# Patient Record
Sex: Female | Born: 1960 | Race: White | Hispanic: Refuse to answer | Marital: Single | Smoking: Former smoker
Health system: Southern US, Community
[De-identification: ages and names within clinical notes are randomized; demographics above are authoritative.]

## PROBLEM LIST (undated history)

## (undated) DIAGNOSIS — M1990 Unspecified osteoarthritis, unspecified site: Secondary | ICD-10-CM

## (undated) DIAGNOSIS — M542 Cervicalgia: Secondary | ICD-10-CM

## (undated) DIAGNOSIS — Z862 Personal history of diseases of the blood and blood-forming organs and certain disorders involving the immune mechanism: Secondary | ICD-10-CM

## (undated) DIAGNOSIS — F1910 Other psychoactive substance abuse, uncomplicated: Secondary | ICD-10-CM

## (undated) DIAGNOSIS — K838 Other specified diseases of biliary tract: Secondary | ICD-10-CM

## (undated) DIAGNOSIS — F419 Anxiety disorder, unspecified: Secondary | ICD-10-CM

## (undated) DIAGNOSIS — K219 Gastro-esophageal reflux disease without esophagitis: Secondary | ICD-10-CM

## (undated) DIAGNOSIS — E785 Hyperlipidemia, unspecified: Secondary | ICD-10-CM

## (undated) DIAGNOSIS — E559 Vitamin D deficiency, unspecified: Secondary | ICD-10-CM

## (undated) DIAGNOSIS — F32A Depression, unspecified: Secondary | ICD-10-CM

## (undated) DIAGNOSIS — D649 Anemia, unspecified: Secondary | ICD-10-CM

## (undated) DIAGNOSIS — Z6740 Type O blood, Rh positive: Secondary | ICD-10-CM

## (undated) DIAGNOSIS — M549 Dorsalgia, unspecified: Secondary | ICD-10-CM

## (undated) DIAGNOSIS — G4700 Insomnia, unspecified: Secondary | ICD-10-CM

## (undated) DIAGNOSIS — G8929 Other chronic pain: Secondary | ICD-10-CM

## (undated) DIAGNOSIS — F1010 Alcohol abuse, uncomplicated: Secondary | ICD-10-CM

## (undated) DIAGNOSIS — G43909 Migraine, unspecified, not intractable, without status migrainosus: Secondary | ICD-10-CM

## (undated) DIAGNOSIS — T8859XA Other complications of anesthesia, initial encounter: Secondary | ICD-10-CM

## (undated) HISTORY — DX: Depression, unspecified: F32.A

## (undated) HISTORY — DX: Anemia, unspecified: D64.9

## (undated) HISTORY — DX: Personal history of diseases of the blood and blood-forming organs and certain disorders involving the immune mechanism: Z86.2

## (undated) HISTORY — DX: Dorsalgia, unspecified: M54.9

## (undated) HISTORY — PX: REPLACEMENT TOTAL KNEE: SUR1224

## (undated) HISTORY — DX: Alcohol abuse, uncomplicated: F10.10

## (undated) HISTORY — DX: Other chronic pain: G89.29

## (undated) HISTORY — DX: Unspecified osteoarthritis, unspecified site: M19.90

## (undated) HISTORY — DX: Hyperlipidemia, unspecified: E78.5

## (undated) HISTORY — DX: Vitamin D deficiency, unspecified: E55.9

## (undated) HISTORY — DX: Gastro-esophageal reflux disease without esophagitis: K21.9

## (undated) HISTORY — PX: ENDOMETRIAL ABLATION W/ NOVASURE: SUR434

## (undated) HISTORY — DX: Insomnia, unspecified: G47.00

## (undated) HISTORY — DX: Cervicalgia: M54.2

## (undated) HISTORY — PX: JOINT REPLACEMENT: SHX530

## (undated) HISTORY — DX: Migraine, unspecified, not intractable, without status migrainosus: G43.909

## (undated) HISTORY — PX: COLONOSCOPY: SHX174

## (undated) HISTORY — PX: MOUTH SURGERY: SHX715

## (undated) HISTORY — DX: Anxiety disorder, unspecified: F41.9

## (undated) HISTORY — DX: Other specified diseases of biliary tract: K83.8

## (undated) HISTORY — DX: Type O blood, Rh positive: Z67.40

## (undated) HISTORY — DX: Other psychoactive substance abuse, uncomplicated: F19.10

---

## 1996-06-08 DIAGNOSIS — F424 Excoriation (skin-picking) disorder: Secondary | ICD-10-CM

## 1996-06-08 HISTORY — DX: Excoriation (skin-picking) disorder: F42.4

## 1996-06-08 HISTORY — PX: GASTRIC BYPASS: SHX52

## 1999-06-09 HISTORY — PX: CHOLECYSTECTOMY: SHX55

## 2001-06-08 HISTORY — PX: HEMORRHOID SURGERY: SHX153

## 2002-06-08 HISTORY — PX: HERNIA REPAIR: SHX51

## 2004-06-08 HISTORY — PX: ABDOMINAL HYSTERECTOMY: SHX81

## 2006-06-08 HISTORY — PX: KNEE ARTHROSCOPY: SUR90

## 2006-09-07 HISTORY — PX: ABDOMINAL HYSTERECTOMY: SHX81

## 2014-09-18 LAB — HM HIV SCREENING LAB: HM HIV Screening: NEGATIVE

## 2014-09-18 LAB — HM HEPATITIS C SCREENING LAB: HM Hepatitis Screen: NEGATIVE

## 2017-06-08 HISTORY — PX: REPLACEMENT TOTAL KNEE: SUR1224

## 2017-07-08 DIAGNOSIS — M810 Age-related osteoporosis without current pathological fracture: Secondary | ICD-10-CM

## 2017-07-08 HISTORY — DX: Age-related osteoporosis without current pathological fracture: M81.0

## 2017-08-04 DIAGNOSIS — F331 Major depressive disorder, recurrent, moderate: Secondary | ICD-10-CM | POA: Insufficient documentation

## 2019-08-11 LAB — BASIC METABOLIC PANEL
BUN: 9 (ref 4–21)
CO2: 25 — AB (ref 13–22)
Chloride: 109 — AB (ref 99–108)
Creatinine: 1 (ref 0.5–1.1)
Glucose: 97
Potassium: 3.6 (ref 3.4–5.3)
Sodium: 141 (ref 137–147)

## 2019-08-11 LAB — IRON,TIBC AND FERRITIN PANEL
%SAT: 5
Ferritin: 10
Iron: 27
TIBC: 505

## 2019-08-11 LAB — HEMOGLOBIN A1C: Hemoglobin A1C: 5

## 2019-08-11 LAB — LIPID PANEL
Cholesterol: 190 (ref 0–200)
HDL: 44 (ref 35–70)
LDL Cholesterol: 123
Triglycerides: 115 (ref 40–160)

## 2019-08-11 LAB — CBC: RBC: 4.22 (ref 3.87–5.11)

## 2019-08-11 LAB — TSH: TSH: 1.7 (ref 0.41–5.90)

## 2019-08-11 LAB — CBC AND DIFFERENTIAL
HCT: 35 — AB (ref 36–46)
Hemoglobin: 10.4 — AB (ref 12.0–16.0)
Platelets: 243 (ref 150–399)
WBC: 6.5

## 2019-08-11 LAB — COMPREHENSIVE METABOLIC PANEL
Albumin: 3.7 (ref 3.5–5.0)
Calcium: 9.9 (ref 8.7–10.7)
GFR calc non Af Amer: 66
Globulin: 3.5

## 2019-08-11 LAB — VITAMIN D 25 HYDROXY (VIT D DEFICIENCY, FRACTURES): Vit D, 25-Hydroxy: 38

## 2020-01-12 ENCOUNTER — Ambulatory Visit (INDEPENDENT_AMBULATORY_CARE_PROVIDER_SITE_OTHER): Payer: Medicare Other | Admitting: Family Medicine

## 2020-01-12 ENCOUNTER — Encounter: Payer: Self-pay | Admitting: Family Medicine

## 2020-01-12 ENCOUNTER — Other Ambulatory Visit: Payer: Self-pay

## 2020-01-12 VITALS — BP 127/76 | HR 66 | Temp 97.6°F | Ht 65.0 in | Wt 275.8 lb

## 2020-01-12 DIAGNOSIS — R519 Headache, unspecified: Secondary | ICD-10-CM

## 2020-01-12 DIAGNOSIS — M25551 Pain in right hip: Secondary | ICD-10-CM | POA: Diagnosis not present

## 2020-01-12 DIAGNOSIS — F419 Anxiety disorder, unspecified: Secondary | ICD-10-CM

## 2020-01-12 DIAGNOSIS — F331 Major depressive disorder, recurrent, moderate: Secondary | ICD-10-CM

## 2020-01-12 DIAGNOSIS — M545 Low back pain, unspecified: Secondary | ICD-10-CM

## 2020-01-12 DIAGNOSIS — M25552 Pain in left hip: Secondary | ICD-10-CM

## 2020-01-12 DIAGNOSIS — G8929 Other chronic pain: Secondary | ICD-10-CM

## 2020-01-12 NOTE — Progress Notes (Signed)
New Patient Office Visit  Assessment & Plan:  1. Frequent headaches - After further discussing, headaches are occurring the days she does not take Celebrex. She is going to resume Celebrex QD.   2. Bilateral hip pain - Resume Celebrex QD since the pain occurs the days she does not take it.   3. Morbid obesity (HCC) - Education provided on obesity. Encouraged dietary changes.   4. Chronic bilateral low back pain without sciatica - Patient to return next week to discuss oxycodone after records have been reviewed and UDS obtained.  - Compliance Drug Analysis, Ur  5. Anxiety - Well controlled on current regimen.   6. Moderate episode of recurrent major depressive disorder (HCC) - Well controlled on current regimen.    Follow-up: Return in about 6 days (around 01/18/2020) for pain management.   Deliah Boston, MSN, APRN, FNP-C [facility]  Subjective:  Patient ID: Graceyn Fodor. Minkler, female    DOB: [birth year]  Age: 59 y.o. MRN: [id number]  Patient Care Team: Gwenlyn Fudge, FNP as PCP - General (Family Medicine)  CC:  Chief Complaint  Patient presents with  . New Patient (Initial Visit)    [place]  . Establish Care  . Headache    Patient states she has been having daily headaches x 3 weeks.  . Leg Pain    Patient states she has been having bilateral leg pain x 1 month.  . Edema    All over x 2 months    HPI Zelphia Cairo. Schiffman presents to establish care.   Patient moved to [facility] in April from [place].  Patient c/o headaches that always hit in the afternoon. She describes them as sharp pain in temple and across the eyes. She does not take medication when they come as she tries to avoid taking medication. When they become severe, she will take Naproxen which is helpful. She does take Benadryl 50 mg BID for allergies and has been taking it during the past 3 weeks when the headaches have been going on.   When she lays on her sides her hips ache on  the side she is laying on. When she gets up in the morning the pain goes down her left leg. She feels she has been walking different for the past two months. She does not feel the walking different is due to pain as she reports it happened in the past and she required PT, which was helpful. She is taking Celebrex every other day and reports it provides relief on the days she takes it.   She is also concerned that she is getting larger again. She has had gastric bypass. She does not feel she overeats, but she does graze throughout the day. She does not drink any soda. Drinks 6-8 bottles of water per day. Does not eat bread - uses tortillas. She feels she has a lot of fluid on her. She does add salt to her foods and eats a lot of high sodium foods.   Patient takes oxycodone for chronic pain.    Review of Systems  Constitutional: Negative for chills, fever, malaise/fatigue and weight loss.  HENT: Negative for congestion, ear discharge, ear pain, nosebleeds, sinus pain, sore throat and tinnitus.   Eyes: Negative for blurred vision, double vision, pain, discharge and redness.  Respiratory: Negative for cough, shortness of breath and wheezing.   Cardiovascular: Negative for chest pain, palpitations and leg swelling.  Gastrointestinal: Negative for abdominal pain, constipation, diarrhea,  heartburn, nausea and vomiting.  Genitourinary: Negative for dysuria, frequency and urgency.  Musculoskeletal: Positive for back pain and joint pain. Negative for myalgias.  Skin: Negative for rash.  Neurological: Positive for headaches. Negative for dizziness, seizures and weakness.  Psychiatric/Behavioral: Negative for depression, substance abuse and suicidal ideas. The patient is not nervous/anxious.     Current Outpatient Medications:  .  B Complex-C (SUPER B COMPLEX PO), Take 1 tablet by mouth daily., Disp: , Rfl:  .  busPIRone (BUSPAR) 15 MG tablet, Take 1 tablet by mouth in the morning, at noon, and at  bedtime., Disp: , Rfl:  .  celecoxib (CELEBREX) 200 MG capsule, Take 1 capsule by mouth every other day., Disp: , Rfl:  .  Cholecalciferol (VITAMIN D3) 125 MCG (5000 UT) CAPS, Take 1 capsule by mouth daily., Disp: , Rfl:  .  diphenhydrAMINE (ALLERGY) 25 mg capsule, Take 50 mg by mouth daily as needed., Disp: , Rfl:  .  famotidine (PEPCID) 20 MG tablet, Take 20 mg by mouth daily., Disp: , Rfl:  .  Oxycodone HCl 10 MG TABS, Take 10 mg by mouth every 8 (eight) hours as needed., Disp: , Rfl:  .  sertraline (ZOLOFT) 100 MG tablet, Take 1 tablet by mouth in the morning and at bedtime., Disp: , Rfl:  .  traZODone (DESYREL) 150 MG tablet, Take 2 tablets by mouth at bedtime., Disp: , Rfl:  .  ziprasidone (GEODON) 20 MG capsule, Take 1 capsule by mouth in the morning and at bedtime., Disp: , Rfl:   Allergies  Allergen Reactions  . Demerol [Meperidine] Other (See Comments)    disorientation   . Other Other (See Comments)    Disoriented and nausea  . Morphine Palpitations    SLOW HEART RATE     Past Medical History:  Diagnosis Date  . Alcohol abuse   . Anxiety   . Depression   . Drug abuse (HCC)   . Migraines     Past Surgical History:  Procedure Laterality Date  . ABDOMINAL HYSTERECTOMY  09/07/2006  . CESAREAN SECTION  02/16/1990  . CHOLECYSTECTOMY  2001  . GASTRIC BYPASS  1998  . HEMORRHOID SURGERY  2003  . HERNIA REPAIR  2004  . MOUTH SURGERY    . REPLACEMENT TOTAL KNEE Right 2019    Family History  Problem Relation Age of Onset  . Anxiety disorder Mother   . Depression Mother   . Breast cancer Mother   . Ovarian cancer Mother   . Alcohol abuse Father   . Heart disease Father   . Kidney disease Brother   . Breast cancer Maternal Aunt   . Breast cancer Maternal Grandmother   . Breast cancer Maternal Grandfather     Social History   Socioeconomic History  . Marital status: Single    Spouse name: Not on file  . Number of children: Not on file  . Years of education:  Not on file  . Highest education level: Not on file  Occupational History  . Not on file  Tobacco Use  . Smoking status: Former Smoker    Types: Cigarettes    Quit date: 1991    Years since quitting: 30.6  . Smokeless tobacco: Never Used  Vaping Use  . Vaping Use: Never used  Substance and Sexual Activity  . Alcohol use: Not Currently  . Drug use: Not Currently  . Sexual activity: Not Currently  Other Topics Concern  . Not on file  Social History  Narrative  . Not on file   Social Determinants of Health   Financial Resource Strain:   . Difficulty of Paying Living Expenses:   Food Insecurity:   . Worried About Programme researcher, broadcasting/film/video in the Last Year:   . Barista in the Last Year:   Transportation Needs:   . Freight forwarder (Medical):   Marland Kitchen Lack of Transportation (Non-Medical):   Physical Activity:   . Days of Exercise per Week:   . Minutes of Exercise per Session:   Stress:   . Feeling of Stress :   Social Connections:   . Frequency of Communication with Friends and Family:   . Frequency of Social Gatherings with Friends and Family:   . Attends Religious Services:   . Active Member of Clubs or Organizations:   . Attends Banker Meetings:   Marland Kitchen Marital Status:   Intimate Partner Violence:   . Fear of Current or Ex-Partner:   . Emotionally Abused:   Marland Kitchen Physically Abused:   . Sexually Abused:     Objective:   Today's Vitals: BP 127/76   Pulse 66   Temp 97.6 F (36.4 C) (Temporal)   Ht 5\' 5"  (1.651 m)   Wt 275 lb 12.8 oz (125.1 kg)   SpO2 96%   BMI 45.90 kg/m   Physical Exam Vitals reviewed.  Constitutional:      General: She is not in acute distress.    Appearance: Normal appearance. She is morbidly obese. She is not ill-appearing, toxic-appearing or diaphoretic.  HENT:     Head: Normocephalic and atraumatic.  Eyes:     General: No scleral icterus.       Right eye: No discharge.        Left eye: No discharge.      Conjunctiva/sclera: Conjunctivae normal.  Cardiovascular:     Rate and Rhythm: Normal rate and regular rhythm.     Heart sounds: Normal heart sounds. No murmur heard.  No friction rub. No gallop.   Pulmonary:     Effort: Pulmonary effort is normal. No respiratory distress.     Breath sounds: Normal breath sounds. No stridor. No wheezing, rhonchi or rales.  Musculoskeletal:        General: Normal range of motion.     Cervical back: Normal range of motion.  Skin:    General: Skin is warm and dry.     Capillary Refill: Capillary refill takes less than 2 seconds.     Comments: Scabs all over from patient picking at her skin.  Neurological:     General: No focal deficit present.     Mental Status: She is alert and oriented to person, place, and time. Mental status is at baseline.  Psychiatric:        Mood and Affect: Mood normal.        Behavior: Behavior normal.        Thought Content: Thought content normal.        Judgment: Judgment normal.

## 2020-01-12 NOTE — Patient Instructions (Addendum)
Take Celebrex daily for pain control.   Obesity, Adult Obesity is having too much body fat. Being obese means that your weight is more than what is healthy for you. BMI is a number that explains how much body fat you have. If you have a BMI of 30 or more, you are obese. Obesity is often caused by eating or drinking more calories than your body uses. Changing your lifestyle can help you lose weight. Obesity can cause serious health problems, such as:  Stroke.  Coronary artery disease (CAD).  Type 2 diabetes.  Some types of cancer, including cancers of the colon, breast, uterus, and gallbladder.  Osteoarthritis.  High blood pressure (hypertension).  High cholesterol.  Sleep apnea.  Gallbladder stones.  Infertility problems. What are the causes?  Eating meals each day that are high in calories, sugar, and fat.  Being born with genes that may make you more likely to become obese.  Having a medical condition that causes obesity.  Taking certain medicines.  Sitting a lot (having a sedentary lifestyle).  Not getting enough sleep.  Drinking a lot of drinks that have sugar in them. What increases the risk?  Having a family history of obesity.  Being an [place] American woman.  Being a Hispanic man.  Living in an area with limited access to: ? [facility], recreation centers, or sidewalks. ? Healthy food choices, such as grocery stores and farmers' markets. What are the signs or symptoms? The main sign is having too much body fat. How is this treated?  Treatment for this condition often includes changing your lifestyle. Treatment may include: ? Changing your diet. This may include making a healthy meal plan. ? Exercise. This may include activity that causes your heart to beat faster (aerobic exercise) and strength training. Work with your doctor to design a program that works for you. ? Medicine to help you lose weight. This may be used if you are not able to lose 1 pound a  week after 6 weeks of healthy eating and more exercise. ? Treating conditions that cause the obesity. ? Surgery. Options may include gastric banding and gastric bypass. This may be done if:  Other treatments have not helped to improve your condition.  You have a BMI of 40 or higher.  You have life-threatening health problems related to obesity. Follow these instructions at home: Eating and drinking   Follow advice from your doctor about what to eat and drink. Your doctor may tell you to: ? Limit fast food, sweets, and processed snack foods. ? Choose low-fat options. For example, choose low-fat milk instead of whole milk. ? Eat 5 or more servings of fruits or vegetables each day. ? Eat at home more often. This gives you more control over what you eat. ? Choose healthy foods when you eat out. ? Learn to read food labels. This will help you learn how much food is in 1 serving. ? Keep low-fat snacks available. ? Avoid drinks that have a lot of sugar in them. These include soda, fruit juice, iced tea with sugar, and flavored milk.  Drink enough water to keep your pee (urine) pale yellow.  Do not go on fad diets. Physical activity  Exercise often, as told by your doctor. Most adults should get up to 150 minutes of moderate-intensity exercise every week.Ask your doctor: ? What types of exercise are safe for you. ? How often you should exercise.  Warm up and stretch before being active.  Do slow stretching  after being active (cool down).  Rest between times of being active. Lifestyle  Work with your doctor and a food expert (dietitian) to set a weight-loss goal that is best for you.  Limit your screen time.  Find ways to reward yourself that do not involve food.  Do not drink alcohol if: ? Your doctor tells you not to drink. ? You are pregnant, may be pregnant, or are planning to become pregnant.  If you drink alcohol: ? Limit how much you use to:  0-1 drink a day for  women.  0-2 drinks a day for men. ? Be aware of how much alcohol is in your drink. In the [place] one drink equals one 12 oz bottle of beer (355 mL), one 5 oz glass of wine (148 mL), or one 1 oz glass of hard liquor (44 mL). General instructions  Keep a weight-loss journal. This can help you keep track of: ? The food that you eat. ? How much exercise you get.  Take over-the-counter and prescription medicines only as told by your doctor.  Take vitamins and supplements only as told by your doctor.  Think about joining a support group.  Keep all follow-up visits as told by your doctor. This is important. Contact a doctor if:  You cannot meet your weight loss goal after you have changed your diet and lifestyle for 6 weeks. Get help right away if you:  Are having trouble breathing.  Are having thoughts of harming yourself. Summary  Obesity is having too much body fat.  Being obese means that your weight is more than what is healthy for you.  Work with your doctor to set a weight-loss goal.  Get regular exercise as told by your doctor. This information is not intended to replace advice given to you by your health care provider. Make sure you discuss any questions you have with your health care provider. Document Revised: 01/27/2018 Document Reviewed: 01/27/2018 Elsevier Patient Education  2020 [employer].

## 2020-01-15 ENCOUNTER — Encounter: Payer: Self-pay | Admitting: Family Medicine

## 2020-01-15 DIAGNOSIS — F419 Anxiety disorder, unspecified: Secondary | ICD-10-CM | POA: Insufficient documentation

## 2020-01-15 DIAGNOSIS — F411 Generalized anxiety disorder: Secondary | ICD-10-CM | POA: Insufficient documentation

## 2020-01-15 DIAGNOSIS — G8929 Other chronic pain: Secondary | ICD-10-CM | POA: Insufficient documentation

## 2020-01-16 ENCOUNTER — Encounter: Payer: Self-pay | Admitting: Family Medicine

## 2020-01-16 LAB — COMPLIANCE DRUG ANALYSIS, UR

## 2020-01-18 ENCOUNTER — Encounter: Payer: Self-pay | Admitting: Family Medicine

## 2020-01-18 ENCOUNTER — Ambulatory Visit (INDEPENDENT_AMBULATORY_CARE_PROVIDER_SITE_OTHER): Payer: Medicare Other | Admitting: Family Medicine

## 2020-01-18 ENCOUNTER — Other Ambulatory Visit: Payer: Self-pay

## 2020-01-18 VITALS — BP 118/62 | HR 66 | Temp 97.6°F | Ht 65.0 in | Wt 274.4 lb

## 2020-01-18 DIAGNOSIS — E559 Vitamin D deficiency, unspecified: Secondary | ICD-10-CM

## 2020-01-18 DIAGNOSIS — M545 Low back pain, unspecified: Secondary | ICD-10-CM

## 2020-01-18 DIAGNOSIS — Z79899 Other long term (current) drug therapy: Secondary | ICD-10-CM

## 2020-01-18 DIAGNOSIS — D509 Iron deficiency anemia, unspecified: Secondary | ICD-10-CM | POA: Diagnosis not present

## 2020-01-18 DIAGNOSIS — G8929 Other chronic pain: Secondary | ICD-10-CM

## 2020-01-18 MED ORDER — OXYCODONE HCL 10 MG PO TABS: 10.0000 mg | ORAL_TABLET | Freq: Three times a day (TID) | ORAL | 0 refills | Status: DC | PRN

## 2020-01-18 NOTE — Progress Notes (Signed)
Assessment & Plan:  1-2. Chronic bilateral low back pain without sciatica/Controlled substance agreement signed - Well controlled on current regimen. Controlled substance agreement signed. UDS obtained last week and was as expected. PDMP reviewed with no concerning findings.  - Oxycodone HCl 10 MG TABS; Take 1 tablet (10 mg total) by mouth every 8 (eight) hours as needed.  Dispense: 90 tablet; Refill: 0 - Oxycodone HCl 10 MG TABS; Take 1 tablet (10 mg total) by mouth every 8 (eight) hours as needed.  Dispense: 90 tablet; Refill: 0 - Oxycodone HCl 10 MG TABS; Take 1 tablet (10 mg total) by mouth every 8 (eight) hours as needed.  Dispense: 90 tablet; Refill: 0 - CMP14+EGFR; Future  3. Iron deficiency anemia, unspecified iron deficiency anemia type - Patient is taking iron supplement once daily.  - CBC with Differential/Platelet; Future  4. Vitamin D deficiency - VITAMIN D 25 Hydroxy (Vit-D Deficiency, Fractures); Future  5. Morbid obesity ([place]) - CBC with Differential/Platelet; Future - CMP14+EGFR; Future - Lipid panel; Future   Return in about 3 months (around 04/19/2020) for follow-up of chronic medication conditions/pain.  Hendricks Limes, MSN, APRN, FNP-C [facility]  Subjective:    Patient ID: Hannah [name], female    DOB: [birth year], 59 y.o.   MRN: [id number]  Patient Care Team: Hannah Brooklyn, FNP as PCP - General (Family Medicine)   Chief Complaint:  Chief Complaint  Patient presents with   Back Pain    1 wk rck pain mngt    HPI: Hannah Caloca. [name] is a 59 y.o. female presenting on 01/18/2020 for Back Pain (1 wk rck pain mngt)  [place] Controlled Substance Abuse database reviewed- Yes   If yes- were their any concerning findings : No  Depression screen PHQ 2/9 01/12/2020  Decreased Interest 0  Down, Depressed, Hopeless 1  PHQ - 2 Score 1  Altered sleeping 2  Tired, decreased energy 2  Change in appetite 0  Feeling bad or failure  about yourself  1  Trouble concentrating 0  Moving slowly or fidgety/restless 0  Suicidal thoughts 0  PHQ-9 Score 6   GAD 7 : Generalized Anxiety Score 01/12/2020  Nervous, Anxious, on Edge 1  Control/stop worrying 1  Worry too much - different things 1  Trouble relaxing 0  Restless 0  Easily annoyed or irritable 1  Afraid - awful might happen 0  Total GAD 7 Score 4    [facility] drug screen performed- Yes  SOAPP  0= never  1= seldom  2=sometimes  3= often  4= very often  How often do you have mood swings? 0 How often do you smoke a cigarette within an hour after waking up? 0 How often have you taken medication other than the way that it was prescribed? 0 How often have you used illegal drugs in the past 5 years? 1 How often, in your lifetime, have you had legal problems or been arrested? 1  Score 2  Alcohol Audit - How often during the last year have found that you: 0-Never   1- Less than monthly   2- Monthly     3-Weekly     4-daily or almost daily  - found that you were not able to stop drinking once you started- 1 -failed to do what was normally expected of you because of drinking- 0 -needed a first drink in the morning- 0 -had a feeling of guilt or remorse after drinking- 0 -are/were unable to  remember what happened the night before because of your drinking- 0  0- NO   2- yes but not in last year  4- yes during last year -Have you or someone else been injured because of your drinking- 0 - Has anyone been concerned about your drinking or suggested you cut down- 2        TOTAL- 3  ( 0-7- alcohol education, 8-15- simple advice, 16-19 simple advice plus counseling, 20-40 referral for evaluation and treatment )  Opioid Risk  01/18/2020  Alcohol 3  Illegal Drugs 0  Rx Drugs 0  Alcohol 3  Illegal Drugs 0  Rx Drugs 0  Age between 16-45 years  0  History of Preadolescent Sexual Abuse 3  Psychological Disease 0  Depression 1  Opioid Risk Tool Scoring 10  Opioid Risk  Interpretation High Risk    Designated Pharmacy- [facility] in [place]  Pain assessment: Cause of pain- chronic back pain after work accident Pain location- back Pain on scale of 1-10- 6-7/10 without medication; 2-3/10 with medication Frequency- daily What increases pain- physical activity What makes pain better- rest, medication Effects on ADL- makes more difficult  Prior treatments tried and failed- PT, Norco, Tylenol, Ibuprofen Current opioids rx- Oxycodone 10 mg Q8H PRN # prescribed- 90 Morphine mg equivalent- 45 MME/day  Pain management agreement reviewed and signed- Yes   New complaints: None  Social history:  Relevant past medical, surgical, family and social history reviewed and updated as indicated. Interim medical history since our last visit reviewed.  Allergies and medications reviewed and updated.  DATA REVIEWED: CHART IN EPIC  ROS: Negative unless specifically indicated above in HPI.    Current Outpatient Medications:    B Complex-C (SUPER B COMPLEX PO), Take 1 tablet by mouth daily., Disp: , Rfl:    busPIRone (BUSPAR) 15 MG tablet, Take 1 tablet by mouth in the morning, at noon, and at bedtime., Disp: , Rfl:    celecoxib (CELEBREX) 200 MG capsule, Take 1 capsule by mouth every other day., Disp: , Rfl:    Cholecalciferol (VITAMIN D3) 125 MCG (5000 UT) CAPS, Take 1 capsule by mouth daily., Disp: , Rfl:    diphenhydrAMINE (ALLERGY) 25 mg capsule, Take 50 mg by mouth daily as needed., Disp: , Rfl:    famotidine (PEPCID) 20 MG tablet, Take 20 mg by mouth daily., Disp: , Rfl:    Oxycodone HCl 10 MG TABS, Take 10 mg by mouth every 8 (eight) hours as needed., Disp: , Rfl:    sertraline (ZOLOFT) 100 MG tablet, Take 1 tablet by mouth in the morning and at bedtime., Disp: , Rfl:    traZODone (DESYREL) 100 MG tablet, Take 200 mg by mouth at bedtime., Disp: , Rfl:    ziprasidone (GEODON) 20 MG capsule, Take 1 capsule by mouth in the morning and at bedtime.,  Disp: , Rfl:    Allergies  Allergen Reactions   Demerol [Meperidine] Other (See Comments)    disorientation    Other Other (See Comments)    Disoriented and nausea   Morphine Palpitations    SLOW HEART RATE    Past Medical History:  Diagnosis Date   Acquired dilation of bile duct    Alcohol abuse    Anxiety    Blood type O+    Chronic neck and back pain    Compulsive skin picking 1998   Depression    Drug abuse ([place])    GERD (gastroesophageal reflux disease)    History of  iron deficiency anemia    Hyperlipidemia    Insomnia    Migraines    Osteoporosis 07/08/2017   T-score -2.6 in 2019; -2.2 on 08/05/2018   Vitamin D deficiency     Past Surgical History:  Procedure Laterality Date   ABDOMINAL HYSTERECTOMY  09/07/2006   CESAREAN SECTION  02/16/1990   CHOLECYSTECTOMY  2001   ENDOMETRIAL ABLATION W/ [place]     GASTRIC BYPASS  1998   HEMORRHOID SURGERY  2003   HERNIA REPAIR  2004   KNEE ARTHROSCOPY Right 2008   MOUTH SURGERY     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    Social History   Socioeconomic History   Marital status: Single    Spouse name: Not on file   Number of children: Not on file   Years of education: Not on file   Highest education level: Not on file  Occupational History   Not on file  Tobacco Use   Smoking status: Former Smoker    Types: Cigarettes    Quit date: 1991    Years since quitting: 30.6   Smokeless tobacco: Never Used  Scientific laboratory technician Use: Never used  Substance and Sexual Activity   Alcohol use: Not Currently   Drug use: Not Currently   Sexual activity: Not Currently  Other Topics Concern   Not on file  Social History Narrative   Not on file   Social Determinants of Health   Financial Resource Strain:    Difficulty of Paying Living Expenses:   Food Insecurity:    Worried About Charity fundraiser in the Last Year:    Arboriculturist in the Last Year:   Transportation  Needs:    Film/video editor (Medical):    Lack of Transportation (Non-Medical):   Physical Activity:    Days of Exercise per Week:    Minutes of Exercise per Session:   Stress:    Feeling of Stress :   Social Connections:    Frequency of Communication with Friends and Family:    Frequency of Social Gatherings with Friends and Family:    Attends Religious Services:    Active Member of Clubs or Organizations:    Attends Music therapist:    Marital Status:   Intimate Partner Violence:    Fear of Current or Ex-Partner:    Emotionally Abused:    Physically Abused:    Sexually Abused:         Objective:    BP 118/62    Pulse 66    Temp 97.6 F (36.4 C) (Temporal)    Ht 5' 5"  (1.651 m)    Wt 274 lb 6.4 oz (124.5 kg)    BMI 45.66 kg/m   Wt Readings from Last 3 Encounters:  01/18/20 274 lb 6.4 oz (124.5 kg)  01/12/20 275 lb 12.8 oz (125.1 kg)    Physical Exam Vitals reviewed.  Constitutional:      General: She is not in acute distress.    Appearance: Normal appearance. She is morbidly obese. She is not ill-appearing, toxic-appearing or diaphoretic.  HENT:     Head: Normocephalic and atraumatic.  Eyes:     General: No scleral icterus.       Right eye: No discharge.        Left eye: No discharge.     Conjunctiva/sclera: Conjunctivae normal.  Cardiovascular:     Rate and Rhythm: Normal rate.  Pulmonary:  Effort: Pulmonary effort is normal. No respiratory distress.  Musculoskeletal:        General: Normal range of motion.     Cervical back: Normal range of motion.  Skin:    General: Skin is warm and dry.     Capillary Refill: Capillary refill takes less than 2 seconds.  Neurological:     General: No focal deficit present.     Mental Status: She is alert and oriented to person, place, and time. Mental status is at baseline.  Psychiatric:        Mood and Affect: Mood normal.        Behavior: Behavior normal.        Thought Content:  Thought content normal.        Judgment: Judgment normal.     Lab Results  Component Value Date   TSH 1.70 08/11/2019   Lab Results  Component Value Date   WBC 6.5 08/11/2019   HGB 10.4 (A) 08/11/2019   HCT 35 (A) 08/11/2019   PLT 243 08/11/2019   Lab Results  Component Value Date   NA 141 08/11/2019   K 3.6 08/11/2019   CO2 25 (A) 08/11/2019   BUN 9 08/11/2019   CREATININE 1.0 08/11/2019   ALBUMIN 3.7 08/11/2019   CALCIUM 9.9 08/11/2019   Lab Results  Component Value Date   CHOL 190 08/11/2019   Lab Results  Component Value Date   HDL 44 08/11/2019   Lab Results  Component Value Date   LDLCALC 123 08/11/2019   Lab Results  Component Value Date   TRIG 115 08/11/2019   No results found for: [facility] Lab Results  Component Value Date   HGBA1C 5.0 08/11/2019

## 2020-01-22 ENCOUNTER — Telehealth: Payer: Self-pay | Admitting: Family Medicine

## 2020-01-22 DIAGNOSIS — M545 Low back pain, unspecified: Secondary | ICD-10-CM

## 2020-01-22 DIAGNOSIS — G8929 Other chronic pain: Secondary | ICD-10-CM

## 2020-01-22 DIAGNOSIS — Z79899 Other long term (current) drug therapy: Secondary | ICD-10-CM

## 2020-01-22 NOTE — Telephone Encounter (Signed)
Pt had apt with [facility] 01/18/2020  [facility] [place] does not have Oxycodone HCl 10 MG TABS pt asked if it can be changed to 5MG  180 tablets.  Pt also needs a refill for bacofen 10MG  90 day supply 3TID  Please call back

## 2020-01-23 NOTE — Telephone Encounter (Signed)
Is this for one month or going forward? We could switch to 12 hour ER tablets if they have those. I prefer that over changing to 5 mg tablets.

## 2020-01-24 NOTE — Telephone Encounter (Signed)
I spoke to the pharmacy and they said they are having trouble with getting oxycodone and order it everyday but don't know when they will have the 10mg  in. Pharmacist also said she doesn't have enough 5mg  tablets (#180) and [facility] in is also having trouble with keeping [facility] in stock. They don't have the ER tablets either.

## 2020-01-24 NOTE — Telephone Encounter (Signed)
Can we please advise patient of this and ask her to call around to pharmacies she is willing to use and find one that will be able to accommodate her medication?  Once she has done this I am happy to resend her prescription.

## 2020-01-25 NOTE — Telephone Encounter (Signed)
Pt states the pharmacy stated her pain meds is on backorder. She is wanting to know if the rx can be changed in order to be able to get her medicine.

## 2020-02-13 ENCOUNTER — Other Ambulatory Visit: Payer: Self-pay | Admitting: Family Medicine

## 2020-02-13 NOTE — Telephone Encounter (Signed)
  Prescription Request  02/13/2020  What is the name of the medication or equipment? ziprasidone (GEODON) 20 MG capsule    Have you contacted your pharmacy to request a refill? (if applicable) yes  Which pharmacy would you like this sent to? [facility] in [place]   Patient notified that their request is being sent to the clinical staff for review and that they should receive a response within 2 business days.

## 2020-02-14 MED ORDER — ZIPRASIDONE HCL 20 MG PO CAPS: 20.0000 mg | ORAL_CAPSULE | Freq: Two times a day (BID) | ORAL | 1 refills | Status: DC

## 2020-02-26 ENCOUNTER — Telehealth: Payer: Self-pay | Admitting: Family Medicine

## 2020-02-26 MED ORDER — CELECOXIB 200 MG PO CAPS: 200.0000 mg | ORAL_CAPSULE | Freq: Every day | ORAL | 1 refills | Status: DC

## 2020-02-26 MED ORDER — BACLOFEN 10 MG PO TABS: 10.0000 mg | ORAL_TABLET | Freq: Three times a day (TID) | ORAL | 1 refills | Status: DC | PRN

## 2020-02-26 NOTE — Telephone Encounter (Signed)
  Prescription Request  02/26/2020  What is the name of the medication or equipment? Baclofen 10MG  and celecoxib (CELEBREX) 200 MG capsule  Have you contacted your pharmacy to request a refill? (if applicable) no needs new rx but she was taking what was left from rx from [facility]. Pt says that [facility] is aware of the medicaitons but did not refill at last ov.  [facility]   Patient notified that their request is being sent to the clinical staff for review and that they should receive a response within 2 business days.

## 2020-02-26 NOTE — Telephone Encounter (Signed)
Has not been filled by our office - please advise

## 2020-02-26 NOTE — Telephone Encounter (Signed)
Patient aware and states she is taking the baclofen for her lower back pain.

## 2020-02-26 NOTE — Telephone Encounter (Signed)
I refilled Celebrex but do not have Baclofen on her medication list. I am assuming she takes for her chronic back pain but can we please clarify?

## 2020-03-26 ENCOUNTER — Ambulatory Visit (INDEPENDENT_AMBULATORY_CARE_PROVIDER_SITE_OTHER): Payer: Medicare Other

## 2020-03-26 ENCOUNTER — Other Ambulatory Visit: Payer: Self-pay

## 2020-03-26 ENCOUNTER — Encounter: Payer: Self-pay | Admitting: Family Medicine

## 2020-03-26 ENCOUNTER — Ambulatory Visit (INDEPENDENT_AMBULATORY_CARE_PROVIDER_SITE_OTHER): Payer: Medicare Other | Admitting: Family Medicine

## 2020-03-26 VITALS — BP 129/69 | HR 71 | Temp 97.6°F | Resp 20 | Ht 65.0 in | Wt 277.4 lb

## 2020-03-26 DIAGNOSIS — M25461 Effusion, right knee: Secondary | ICD-10-CM

## 2020-03-26 DIAGNOSIS — M542 Cervicalgia: Secondary | ICD-10-CM

## 2020-03-26 DIAGNOSIS — M898X1 Other specified disorders of bone, shoulder: Secondary | ICD-10-CM

## 2020-03-26 DIAGNOSIS — Z23 Encounter for immunization: Secondary | ICD-10-CM | POA: Diagnosis not present

## 2020-03-26 MED ORDER — PREDNISONE 20 MG PO TABS: ORAL_TABLET | ORAL | 0 refills | Status: DC

## 2020-03-26 MED ORDER — METHYLPREDNISOLONE ACETATE 80 MG/ML IJ SUSP
80.0000 mg | Freq: Once | INTRAMUSCULAR | Status: AC
  Administered 2020-03-26: 80 mg via INTRAMUSCULAR

## 2020-03-26 MED ORDER — BACLOFEN 10 MG PO TABS: 10.0000 mg | ORAL_TABLET | Freq: Three times a day (TID) | ORAL | 0 refills | Status: DC | PRN

## 2020-03-26 NOTE — Patient Instructions (Signed)

## 2020-03-26 NOTE — Progress Notes (Addendum)
Subjective: CC: right hand and knee pain PCP: Gwenlyn Fudge, FNP  Hannah [name]:OZDG R. Thielke is a 59 y.o. female presenting to clinic today for:  1. Neck pain Tailer reports next pain on her right side for about 3 weeks that started after a fall on 03/08/20. She was carrying a pack of water and fell up a few stairs and onto a landing. She landed on her right side. Pain is a steady throb. The pain is a 5/10. It is worse with lateral rotation. She has tried a deep muscle rub and naproxen with some improvement. She does have muscle spasms, baclofen has helped some with this. She takes oxycodone for chronic back pain, this has helped with her neck pain as well. She denies numbness or tingling in arm or hand.    2. Right knee pain She reports right knee pain that started after the fall. The pain is mild, but she reports swelling on the knee that is uncomfortable. The knee feels "sloushy". 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 She denies fever or warmth. Denies clicking or catching. Her knee replacement was done in [place].   Relevant past medical, surgical, family, and social history reviewed and updated as indicated.  Allergies and medications reviewed and updated.  Allergies  Allergen Reactions  . Demerol [Meperidine] Other (See Comments)    disorientation   . Other Other (See Comments)    Disoriented and nausea  . Morphine Palpitations    SLOW HEART RATE    Past Medical History:  Diagnosis Date  . Acquired dilation of bile duct   . Alcohol abuse   . Anxiety   . Blood type O+   . Chronic neck and back pain   . Compulsive skin picking 1998  . Depression   . Drug abuse ([facility])   . GERD (gastroesophageal reflux disease)   . History of iron deficiency anemia   . Hyperlipidemia   . Insomnia   . Migraines   . Osteoporosis 07/08/2017   T-score -2.6 in 2019; -2.2 on 08/05/2018  . Vitamin D deficiency     Current Outpatient Medications:  .  B Complex-C (SUPER B COMPLEX PO),  Take 1 tablet by mouth daily., Disp: , Rfl:  .  baclofen (LIORESAL) 10 MG tablet, Take 1 tablet (10 mg total) by mouth 3 (three) times daily as needed for muscle spasms., Disp: 60 tablet, Rfl: 1 .  busPIRone (BUSPAR) 15 MG tablet, Take 1 tablet by mouth in the morning, at noon, and at bedtime., Disp: , Rfl:  .  celecoxib (CELEBREX) 200 MG capsule, Take 1 capsule (200 mg total) by mouth daily., Disp: 90 capsule, Rfl: 1 .  Cholecalciferol (VITAMIN D3) 125 MCG (5000 UT) CAPS, Take 1 capsule by mouth daily., Disp: , Rfl:  .  diphenhydrAMINE (ALLERGY) 25 mg capsule, Take 50 mg by mouth daily as needed., Disp: , Rfl:  .  famotidine (PEPCID) 20 MG tablet, Take 20 mg by mouth daily., Disp: , Rfl:  .  Oxycodone HCl 10 MG TABS, Take 1 tablet (10 mg total) by mouth every 8 (eight) hours as needed., Disp: 90 tablet, Rfl: 0 .  Oxycodone HCl 10 MG TABS, Take 1 tablet (10 mg total) by mouth every 8 (eight) hours as needed., Disp: 90 tablet, Rfl: 0 .  Oxycodone HCl 10 MG TABS, Take 1 tablet (10 mg total) by mouth every 8 (eight) hours as needed., Disp: 90 tablet, Rfl: 0 .  sertraline (ZOLOFT) 100  MG tablet, Take 1 tablet by mouth in the morning and at bedtime., Disp: , Rfl:  .  traZODone (DESYREL) 100 MG tablet, Take 200 mg by mouth at bedtime., Disp: , Rfl:  .  ziprasidone (GEODON) 20 MG capsule, Take 1 capsule (20 mg total) by mouth in the morning and at bedtime., Disp: 180 capsule, Rfl: 1 Social History   Socioeconomic History  . Marital status: Single    Spouse name: Not on file  . Number of children: Not on file  . Years of education: Not on file  . Highest education level: Not on file  Occupational History  . Not on file  Tobacco Use  . Smoking status: Former Smoker    Types: Cigarettes    Quit date: 1991    Years since quitting: 30.8  . Smokeless tobacco: Never Used  Vaping Use  . Vaping Use: Never used  Substance and Sexual Activity  . Alcohol use: Not Currently  . Drug use: Not Currently    . Sexual activity: Not Currently  Other Topics Concern  . Not on file  Social History Narrative  . Not on file   Social Determinants of Health   Financial Resource Strain:   . Difficulty of Paying Living Expenses: Not on file  Food Insecurity:   . Worried About Programme researcher, broadcasting/film/video in the Last Year: Not on file  . Ran Out of Food in the Last Year: Not on file  Transportation Needs:   . Lack of Transportation (Medical): Not on file  . Lack of Transportation (Non-Medical): Not on file  Physical Activity:   . Days of Exercise per Week: Not on file  . Minutes of Exercise per Session: Not on file  Stress:   . Feeling of Stress : Not on file  Social Connections:   . Frequency of Communication with Friends and Family: Not on file  . Frequency of Social Gatherings with Friends and Family: Not on file  . Attends Religious Services: Not on file  . Active Member of Clubs or Organizations: Not on file  . Attends Banker Meetings: Not on file  . Marital Status: Not on file  Intimate Partner Violence:   . Fear of Current or Ex-Partner: Not on file  . Emotionally Abused: Not on file  . Physically Abused: Not on file  . Sexually Abused: Not on file   Family History  Problem Relation Age of Onset  . Anxiety disorder Mother   . Depression Mother   . Breast cancer Mother   . Ovarian cancer Mother   . Alcohol abuse Father   . Heart disease Father   . Kidney disease Brother   . Heart disease Brother   . Breast cancer Maternal Aunt   . Breast cancer Maternal Grandmother   . Breast cancer Maternal Grandfather     Review of Systems  Per HPI.   Objective: Office vital signs reviewed. BP 129/69   Pulse 71   Temp 97.6 F (36.4 C) (Temporal)   Resp 20   Ht 5\' 5"  (1.651 m)   Wt 277 lb 6 oz (125.8 kg)   SpO2 97%   BMI 46.16 kg/m   Physical Examination:  Physical Exam Vitals and nursing note reviewed.  Constitutional:      Appearance: Normal appearance.  Neck:    Cardiovascular:     Rate and Rhythm: Normal rate and regular rhythm.     Heart sounds: Normal heart sounds. No murmur heard.  Pulmonary:     Effort: Pulmonary effort is normal.     Breath sounds: Normal breath sounds.  Musculoskeletal:     Cervical back: Normal range of motion and neck supple. No erythema, rigidity or crepitus. Pain with movement (with lateral rotation) present. Normal range of motion.     Right knee: Swelling present. No bony tenderness. Normal range of motion. No tenderness.       Legs:  Skin:    Capillary Refill: Capillary refill takes less than 2 seconds.  Neurological:     General: No focal deficit present.     Mental Status: She is alert and oriented to person, place, and time.  Psychiatric:        Mood and Affect: Mood normal.        Behavior: Behavior normal.      Results for orders placed or performed in visit on 01/16/20  [facility] HIV SCREENING LAB  Result Value Ref Range   HM HIV Screening Negative - Validated   HM HEPATITIS C SCREENING LAB  Result Value Ref Range   HM Hepatitis Screen Negative-Validated   Iron, TIBC and Ferritin Panel  Result Value Ref Range   Ferritin 10    Iron 27    TIBC 505    %SAT 5   CBC and differential  Result Value Ref Range   Hemoglobin 10.4 (A) 12.0 - 16.0   HCT 35 (A) 36 - 46   Platelets 243 150 - 399   WBC 6.5   CBC  Result Value Ref Range   RBC 4.22 3.87 - 5.11  VITAMIN D 25 Hydroxy (Vit-D Deficiency, Fractures)  Result Value Ref Range   Vit D, 25-Hydroxy 38   Basic metabolic panel  Result Value Ref Range   Glucose 97    BUN 9 4 - 21   CO2 25 (A) 13 - 22   Creatinine 1.0 0.5 - 1.1   Potassium 3.6 3.4 - 5.3   Sodium 141 137 - 147   Chloride 109 (A) 99 - 108  Comprehensive metabolic panel  Result Value Ref Range   Globulin 3.5    GFR calc non Af Amer 66    Calcium 9.9 8.7 - 10.7   Albumin 3.7 3.5 - 5.0  Lipid panel  Result Value Ref Range   Triglycerides 115 40 - 160   Cholesterol 190 0 - 200    HDL 44 35 - 70   LDL Cholesterol 123   Hemoglobin A1c  Result Value Ref Range   Hemoglobin A1C 5.0   TSH  Result Value Ref Range   TSH 1.70 0.41 - 5.90     Assessment/ Plan: Corrie DandyMary was seen today for right hand and knee pain after a fall.  Diagnoses and all orders for this visit:  Need for immunization against influenza Flu vaccine today in office.  -     Flu Vaccine QUAD 36+ mos IM  Neck pain, acute Likely cervical strain. No indications for xray today. Steroid IM injection today. Prednisone burst to start tomorrow. Continue baclofen. If no improvement, will refer for PT.  -     methylPREDNISolone acetate (DEPO-MEDROL) injection 80 mg -     baclofen (LIORESAL) 10 MG tablet; Take 1 tablet (10 mg total) by mouth 3 (three) times daily as needed for muscle spasms. -     predniSONE (DELTASONE) 20 MG tablet; 2 po at sametime daily for 5 days- start tomorrow  Pain of right clavicle No fracture noted on  Xray today, radiology report pending. -     DG Clavicle Right  Fluid in right knee joint Xray today in office, no obvious trauma to knee hardware, radiology report pending. Steroid IM injection today with PO burst tomorrow. Continue antiinflammatories, elevate, rest. If no improvement, will refer to ortho. Patient prefers to see ortho in [place] or as close as possible if needed.  -     DG Knee 1-2 Views Right -     methylPREDNISolone acetate (DEPO-MEDROL) injection 80 mg -     Cancel: Ambulatory referral to Orthopedic Surgery -     predniSONE (DELTASONE) 20 MG tablet; 2 po at sametime daily for 5 days- start tomorrow  Return to office for new or worsening symptoms, or if symptoms persist.   The above assessment and management plan was discussed with the patient. The patient verbalized understanding of and has agreed to the management plan. Patient is aware to call the clinic if symptoms persist or worsen. Patient is aware when to return to the clinic for a follow-up visit. Patient educated  on when it is appropriate to go to the emergency department.   Harlow Mares, FNP-C [facility] [address] [place], [place] [address] [phone]

## 2020-03-27 ENCOUNTER — Telehealth: Payer: Self-pay

## 2020-03-27 DIAGNOSIS — M25461 Effusion, right knee: Secondary | ICD-10-CM

## 2020-03-27 DIAGNOSIS — M542 Cervicalgia: Secondary | ICD-10-CM

## 2020-03-27 MED ORDER — PREDNISONE 20 MG PO TABS: ORAL_TABLET | ORAL | 0 refills | Status: DC

## 2020-03-27 NOTE — Telephone Encounter (Signed)
Patient aware.

## 2020-03-27 NOTE — Telephone Encounter (Signed)
Patient seen Tiffany - please advise  

## 2020-04-08 ENCOUNTER — Other Ambulatory Visit: Payer: Self-pay | Admitting: Family Medicine

## 2020-04-08 DIAGNOSIS — Z1231 Encounter for screening mammogram for malignant neoplasm of breast: Secondary | ICD-10-CM

## 2020-04-10 ENCOUNTER — Telehealth: Payer: Self-pay

## 2020-04-10 MED ORDER — SERTRALINE HCL 100 MG PO TABS
100.0000 mg | ORAL_TABLET | Freq: Two times a day (BID) | ORAL | 0 refills | Status: DC
Start: 2020-04-10 — End: 2020-04-12

## 2020-04-10 NOTE — Telephone Encounter (Signed)
Zoloft sent appointment made

## 2020-04-10 NOTE — Telephone Encounter (Signed)
  Prescription Request  04/10/2020  What is the name of the medication or equipment? sertraline (ZOLOFT) 100 MG tablet 90 day supply Oxycodone HCl 10 MG TABS    Have you contacted your pharmacy to request a refill? (if applicable) yes  Which pharmacy would you like this sent to? [facility] [place]    Patient notified that their request is being sent to the clinical staff for review and that they should receive a response within 2 business days.

## 2020-04-12 ENCOUNTER — Encounter: Payer: Self-pay | Admitting: Nurse Practitioner

## 2020-04-12 ENCOUNTER — Telehealth: Payer: Self-pay

## 2020-04-12 ENCOUNTER — Ambulatory Visit (INDEPENDENT_AMBULATORY_CARE_PROVIDER_SITE_OTHER): Payer: Medicare Other | Admitting: Nurse Practitioner

## 2020-04-12 ENCOUNTER — Other Ambulatory Visit: Payer: Self-pay

## 2020-04-12 VITALS — BP 128/74 | HR 77 | Temp 96.6°F | Resp 20 | Ht 65.0 in | Wt 275.2 lb

## 2020-04-12 DIAGNOSIS — Z79899 Other long term (current) drug therapy: Secondary | ICD-10-CM

## 2020-04-12 DIAGNOSIS — F331 Major depressive disorder, recurrent, moderate: Secondary | ICD-10-CM

## 2020-04-12 DIAGNOSIS — F5101 Primary insomnia: Secondary | ICD-10-CM

## 2020-04-12 DIAGNOSIS — M545 Low back pain, unspecified: Secondary | ICD-10-CM

## 2020-04-12 DIAGNOSIS — G8929 Other chronic pain: Secondary | ICD-10-CM

## 2020-04-12 DIAGNOSIS — F419 Anxiety disorder, unspecified: Secondary | ICD-10-CM | POA: Diagnosis not present

## 2020-04-12 LAB — LIPID PANEL

## 2020-04-12 MED ORDER — BUSPIRONE HCL 15 MG PO TABS: 15.0000 mg | ORAL_TABLET | Freq: Three times a day (TID) | ORAL | 2 refills | Status: DC

## 2020-04-12 MED ORDER — OXYCODONE HCL 10 MG PO TABS: 10.0000 mg | ORAL_TABLET | Freq: Three times a day (TID) | ORAL | 0 refills | Status: DC | PRN

## 2020-04-12 MED ORDER — OXYCODONE HCL 10 MG PO TABS: 10.0000 mg | ORAL_TABLET | Freq: Three times a day (TID) | ORAL | 0 refills | Status: AC | PRN

## 2020-04-12 MED ORDER — SERTRALINE HCL 100 MG PO TABS: 100.0000 mg | ORAL_TABLET | Freq: Two times a day (BID) | ORAL | 0 refills | Status: DC

## 2020-04-12 MED ORDER — TRAZODONE HCL 100 MG PO TABS: 200.0000 mg | ORAL_TABLET | Freq: Every day | ORAL | 1 refills | Status: DC

## 2020-04-12 NOTE — Addendum Note (Signed)
Addended by: Bennie Pierini on: 04/12/2020 09:21 AM   Modules accepted: Orders

## 2020-04-12 NOTE — Telephone Encounter (Signed)
Corrected pain medication dates

## 2020-04-12 NOTE — Telephone Encounter (Signed)
That is fine with me.

## 2020-04-12 NOTE — Telephone Encounter (Signed)
Per policy - it has to be ok'd by current provider and one requesting to switch to- Will send to PCP

## 2020-04-12 NOTE — Patient Instructions (Signed)
Stress, Adult Stress is a normal reaction to life events. Stress is what you feel when life demands more than you are used to, or more than you think you can handle. Some stress can be useful, such as studying for a test or meeting a deadline at work. Stress that occurs too often or for too long can cause problems. It can affect your emotional health and interfere with relationships and normal daily activities. Too much stress can weaken your body's defense system (immune system) and increase your risk for physical illness. If you already have a medical problem, stress can make it worse. What are the causes? All sorts of life events can cause stress. An event that causes stress for one person may not be stressful for another person. Major life events, whether positive or negative, commonly cause stress. Examples include:  Losing a job or starting a new job.  Losing a loved one.  Moving to a new town or home.  Getting married or divorced.  Having a baby.  Getting injured or sick. Less obvious life events can also cause stress, especially if they occur day after day or in combination with each other. Examples include:  Working long hours.  Driving in traffic.  Caring for children.  Being in debt.  Being in a difficult relationship. What are the signs or symptoms? Stress can cause emotional symptoms, including:  Anxiety. This is feeling worried, afraid, on edge, overwhelmed, or out of control.  Anger, including irritation or impatience.  Depression. This is feeling sad, down, helpless, or guilty.  Trouble focusing, remembering, or making decisions. Stress can cause physical symptoms, including:  Aches and pains. These may affect your head, neck, back, stomach, or other areas of your body.  Tight muscles or a clenched jaw.  Low energy.  Trouble sleeping. Stress can cause unhealthy behaviors, including:  Eating to feel better (overeating) or skipping meals.  Working too  much or putting off tasks.  Smoking, drinking alcohol, or using drugs to feel better. How is this diagnosed? Stress is diagnosed through an assessment by your health care provider. He or she may diagnose this condition based on:  Your symptoms and any stressful life events.  Your medical history.  Tests to rule out other causes of your symptoms. Depending on your condition, your health care provider may refer you to a specialist for further evaluation. How is this treated?  Stress management techniques are the recommended treatment for stress. Medicine is not typically recommended for the treatment of stress. Techniques to reduce your reaction to stressful life events include:  Stress identification. Monitor yourself for symptoms of stress and identify what causes stress for you. These skills may help you to avoid or prepare for stressful events.  Time management. Set your priorities, keep a calendar of events, and learn to say no. Taking these actions can help you avoid making too many commitments. Techniques for coping with stress include:  Rethinking the problem. Try to think realistically about stressful events rather than ignoring them or overreacting. Try to find the positives in a stressful situation rather than focusing on the negatives.  Exercise. Physical exercise can release both physical and emotional tension. The key is to find a form of exercise that you enjoy and do it regularly.  Relaxation techniques. These relax the body and mind. The key is to find one or more that you enjoy and use the techniques regularly. Examples include: ? Meditation, deep breathing, or progressive relaxation techniques. ? Yoga or   tai chi. ? Biofeedback, mindfulness techniques, or journaling. ? Listening to music, being out in nature, or participating in other hobbies.  Practicing a healthy lifestyle. Eat a balanced diet, drink plenty of water, limit or avoid caffeine, and get plenty of  sleep.  Having a strong support network. Spend time with family, friends, or other people you enjoy being around. Express your feelings and talk things over with someone you trust. Counseling or talk therapy with a mental health professional may be helpful if you are having trouble managing stress on your own. Follow these instructions at home: Lifestyle   Avoid drugs.  Do not use any products that contain nicotine or tobacco, such as cigarettes, e-cigarettes, and chewing tobacco. If you need help quitting, ask your health care provider.  Limit alcohol intake to no more than 1 drink a day for nonpregnant women and 2 drinks a day for men. One drink equals 12 oz of beer, 5 oz of wine, or 1 oz of hard liquor  Do not use alcohol or drugs to relax.  Eat a balanced diet that includes fresh fruits and vegetables, whole grains, lean meats, fish, eggs, and beans, and low-fat dairy. Avoid processed foods and foods high in added fat, sugar, and salt.  Exercise at least 30 minutes on 5 or more days each week.  Get 7-8 hours of sleep each night. General instructions   Practice stress management techniques as discussed with your health care provider.  Drink enough fluid to keep your urine clear or pale yellow.  Take over-the-counter and prescription medicines only as told by your health care provider.  Keep all follow-up visits as told by your health care provider. This is important. Contact a health care provider if:  Your symptoms get worse.  You have new symptoms.  You feel overwhelmed by your problems and can no longer manage them on your own. Get help right away if:  You have thoughts of hurting yourself or others. If you ever feel like you may hurt yourself or others, or have thoughts about taking your own life, get help right away. You can go to your nearest emergency department or call:  Your local emergency services (911 in the [place]  A suicide crisis helpline, such as the  [place] at (316) 250-6172. This is open 24 hours a day. Summary  Stress is a normal reaction to life events. It can cause problems if it happens too often or for too long.  Practicing stress management techniques is the best way to treat stress.  Counseling or talk therapy with a mental health professional may be helpful if you are having trouble managing stress on your own. This information is not intended to replace advice given to you by your health care provider. Make sure you discuss any questions you have with your health care provider. Document Revised: 12/23/2018 Document Reviewed: 07/15/2016 Elsevier Patient Education  [place].

## 2020-04-12 NOTE — Telephone Encounter (Signed)
Pt wants to switch providers. Would like Harlow Mares to be her PCP.

## 2020-04-12 NOTE — Progress Notes (Signed)
Subjective:    Patient ID: Hannah [name]. [place], female    DOB: May 01, 1961, 59 y.o.   MRN: [id number]   Chief Complaint: Medication Refill    HPI:  1. Moderate episode of recurrent major depressive disorder (HCC) Has been feeling depressed at times lately. Zoloft and geodon are helping. Tries to keep mentally active to also deal with depression. PHQ9 SCORE ONLY 04/12/2020 03/26/2020 01/12/2020  PHQ-9 Total Score 6 1 6     2. Anxiety Taking buspar tid and it helps. Still having some anxiety but more depression. GAD 7 : Generalized Anxiety Score 04/12/2020 01/12/2020  Nervous, Anxious, on Edge 0 1  Control/stop worrying 1 1  Worry too much - different things 1 1  Trouble relaxing 0 0  Restless 0 0  Easily annoyed or irritable 1 1  Afraid - awful might happen 0 0  Total GAD 7 Score 3 4  Anxiety Difficulty Not difficult at all -     3. Morbid obesity (HCC) No significant changes in weight. Wt Readings from Last 3 Encounters:  04/12/20 275 lb 4 oz (124.9 kg)  03/26/20 277 lb 6 oz (125.8 kg)  01/18/20 274 lb 6.4 oz (124.5 kg)   BMI Readings from Last 3 Encounters:  04/12/20 45.80 kg/m  03/26/20 46.16 kg/m  01/18/20 45.66 kg/m    4. Chronic bilateral low back pain without sciatica Takes oxycodone for chronic back pain. 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    Pain assessment: Cause of pain- forklift injury when she was in her early 20's Pain location- low back Pain on scale of 1-10- 4/10 currently Frequency- daily What increases pain-house work What makes pain Better-resting Effects on ADL - none Any change in general medical condition-none  Current opioids rx- oxycodone 10 TID # meds rx- 90 Effectiveness of current meds-helps Adverse reactions from pain meds-none Morphine equivalent- 02-21-1993  Pill count performed-No Last drug screen - 01/12/20 ( high risk q41m, moderate risk q34m, low risk yearly ) Urine drug screen today- No Was the [facility] reviewed-  yes  If yes were their any concerning findings? - none   Overdose risk: 1 Opioid Risk  01/18/2020  Alcohol 3  Illegal Drugs 0  Rx Drugs 0  Alcohol 3  Illegal Drugs 0  Rx Drugs 0  Age between 16-45 years  0  History of Preadolescent Sexual Abuse 3  Psychological Disease 0  Depression 1  Opioid Risk Tool Scoring 10  Opioid Risk Interpretation High Risk     Pain contract signed on: 01/23/20  5. Insomnia Is on trazadone nightly and works well to help her sleep  Outpatient Encounter Medications as of 04/12/2020  Medication Sig  . B Complex-C (SUPER B COMPLEX PO) Take 1 tablet by mouth daily.  . baclofen (LIORESAL) 10 MG tablet Take 1 tablet (10 mg total) by mouth 3 (three) times daily as needed for muscle spasms.  . busPIRone (BUSPAR) 15 MG tablet Take 1 tablet by mouth in the morning, at noon, and at bedtime.  . celecoxib (CELEBREX) 200 MG capsule Take 1 capsule (200 mg total) by mouth daily.  . Cholecalciferol (VITAMIN D3) 125 MCG (5000 UT) CAPS Take 1 capsule by mouth daily.  . diphenhydrAMINE (ALLERGY) 25 mg capsule Take 50 mg by mouth daily as needed.  . famotidine (PEPCID) 20 MG tablet Take 20 mg by mouth daily.  . Oxycodone HCl 10 MG TABS Take 1 tablet (10 mg total) by mouth every  8 (eight) hours as needed.  . Oxycodone HCl 10 MG TABS Take 1 tablet (10 mg total) by mouth every 8 (eight) hours as needed.  . Oxycodone HCl 10 MG TABS Take 1 tablet (10 mg total) by mouth every 8 (eight) hours as needed.  . sertraline (ZOLOFT) 100 MG tablet Take 1 tablet (100 mg total) by mouth in the morning and at bedtime.  . traZODone (DESYREL) 100 MG tablet Take 200 mg by mouth at bedtime.  . ziprasidone (GEODON) 20 MG capsule Take 1 capsule (20 mg total) by mouth in the morning and at bedtime.  . [DISCONTINUED] predniSONE (DELTASONE) 20 MG tablet 2 po at sametime daily for 5 days-   No facility-administered encounter medications on file as of 04/12/2020.    Past Surgical History:    Procedure Laterality Date  . ABDOMINAL HYSTERECTOMY  09/07/2006  . CESAREAN SECTION  02/16/1990  . CHOLECYSTECTOMY  2001  . ENDOMETRIAL ABLATION W/ NOVASURE    . GASTRIC BYPASS  1998  . HEMORRHOID SURGERY  2003  . HERNIA REPAIR  2004  . KNEE ARTHROSCOPY Right 2008  . MOUTH SURGERY    . 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 Anxiety disorder Mother   . Depression Mother   . Breast cancer Mother   . Ovarian cancer Mother   . Alcohol abuse Father   . Heart disease Father   . Kidney disease Brother   . Heart disease Brother   . Breast cancer Maternal Aunt   . Breast cancer Maternal Grandmother   . Breast cancer Maternal Grandfather     New complaints: None but is still retaining fluid in her right knee since a fall at the end of September.  Social history: Is on disability due to back injury.  Controlled substance contract: 01/23/2020    Review of Systems  Constitutional: Negative.   HENT: Negative.   Eyes: Negative.   Respiratory: Negative.   Cardiovascular: Negative.   Gastrointestinal: Negative.   Genitourinary: Negative.   Musculoskeletal: Positive for back pain and joint swelling (right knee).  Skin: Negative.   Neurological: Negative.   Psychiatric/Behavioral: Negative.        Objective:   Physical Exam Vitals and nursing note reviewed.  Constitutional:      Appearance: Normal appearance.  HENT:     Head: Normocephalic.     Right Ear: Tympanic membrane normal.     Left Ear: Tympanic membrane normal.     Mouth/Throat:     Mouth: Mucous membranes are moist.     Pharynx: Oropharynx is clear.  Eyes:     Conjunctiva/sclera: Conjunctivae normal.  Cardiovascular:     Rate and Rhythm: Normal rate and regular rhythm.     Heart sounds: Normal heart sounds.  Pulmonary:     Effort: Pulmonary effort is normal.     Breath sounds: Normal breath sounds.  Abdominal:     General: Bowel sounds are normal.      Palpations: Abdomen is soft.  Musculoskeletal:        General: Swelling (right knee) present.     Cervical back: Normal range of motion.  Skin:    General: Skin is warm and dry.  Neurological:     General: No focal deficit present.     Mental Status: She is alert and oriented to person, place, and time.  Psychiatric:        Mood and Affect: Mood normal.  Behavior: Behavior normal.    BP 128/74   Pulse 77   Temp (!) 96.6 F (35.9 C) (Temporal)   Resp 20   Ht 5\' 5"  (1.651 m)   Wt 275 lb 4 oz (124.9 kg)   SpO2 99%   BMI 45.80 kg/m      Assessment & Plan:  . Mano comes in today with chief complaint of Medication Refill   Diagnosis and orders addressed:  1. Moderate episode of recurrent major depressive disorder (HCC) - sertraline (ZOLOFT) 100 MG tablet; Take 1 tablet (100 mg total) by mouth in the morning and at bedtime.  Dispense: 180 tablet; Refill: 0 Stress management   2. Anxiety - busPIRone (BUSPAR) 15 MG tablet; Take 1 tablet (15 mg total) by mouth in the morning, at noon, and at bedtime.  Dispense: 90 tablet; Refill: 2  3. Morbid obesity (HCC) Discussed diet and exercise for person with BMI >25 Will recheck weight in 3-6 months  4. Chronic bilateral low back pain without sciatica meds as ordered - Oxycodone HCl 10 MG TABS; Take 1 tablet (10 mg total) by mouth every 8 (eight) hours as needed.  Dispense: 90 tablet; Refill: 0 - Oxycodone HCl 10 MG TABS; Take 1 tablet (10 mg total) by mouth every 8 (eight) hours as needed.  Dispense: 90 tablet; Refill: 0 - Oxycodone HCl 10 MG TABS; Take 1 tablet (10 mg total) by mouth every 8 (eight) hours as needed.  Dispense: 90 tablet; Refill: 0  5. Controlled substance agreement signed  6. Primary insomnia Bedtime routine - traZODone (DESYREL) 100 MG tablet; Take 2 tablets (200 mg total) by mouth at bedtime.  Dispense: 90 tablet; Refill: 1   Labs pending Health Maintenance reviewed Diet and exercise  encouraged  Follow up plan: 3 months   Tate-Margaret Hannah [place], FNP

## 2020-04-12 NOTE — Telephone Encounter (Signed)
Corrected pain med dates

## 2020-04-12 NOTE — Telephone Encounter (Signed)
Patient aware.

## 2020-04-13 LAB — CMP14+EGFR
ALT: 16 IU/L (ref 0–32)
AST: 17 IU/L (ref 0–40)
Albumin/Globulin Ratio: 1.7 (ref 1.2–2.2)
Albumin: 4 g/dL (ref 3.8–4.9)
Alkaline Phosphatase: 94 IU/L (ref 44–121)
BUN/Creatinine Ratio: 9 (ref 9–23)
BUN: 8 mg/dL (ref 6–24)
Bilirubin Total: 0.3 mg/dL (ref 0.0–1.2)
CO2: 25 mmol/L (ref 20–29)
Calcium: 9.9 mg/dL (ref 8.7–10.2)
Chloride: 108 mmol/L — ABNORMAL HIGH (ref 96–106)
Creatinine, Ser: 0.91 mg/dL (ref 0.57–1.00)
GFR calc Af Amer: 80 mL/min/{1.73_m2} (ref >59)
GFR calc non Af Amer: 69 mL/min/{1.73_m2} (ref >59)
Globulin, Total: 2.4 g/dL (ref 1.5–4.5)
Glucose: 83 mg/dL (ref 65–99)
Potassium: 4.3 mmol/L (ref 3.5–5.2)
Sodium: 144 mmol/L (ref 134–144)
Total Protein: 6.4 g/dL (ref 6.0–8.5)

## 2020-04-13 LAB — CBC WITH DIFFERENTIAL/PLATELET
Basophils Absolute: 0.1 10*3/uL (ref 0.0–0.2)
Basos: 1 %
EOS (ABSOLUTE): 0.1 10*3/uL (ref 0.0–0.4)
Eos: 2 %
Hematocrit: 32.1 % — ABNORMAL LOW (ref 34.0–46.6)
Hemoglobin: 10 g/dL — ABNORMAL LOW (ref 11.1–15.9)
Immature Grans (Abs): 0 10*3/uL (ref 0.0–0.1)
Immature Granulocytes: 0 %
Lymphocytes Absolute: 1 10*3/uL (ref 0.7–3.1)
Lymphs: 13 %
MCH: 25.1 pg — ABNORMAL LOW (ref 26.6–33.0)
MCHC: 31.2 g/dL — ABNORMAL LOW (ref 31.5–35.7)
MCV: 81 fL (ref 79–97)
Monocytes Absolute: 0.5 10*3/uL (ref 0.1–0.9)
Monocytes: 6 %
Neutrophils Absolute: 6.2 10*3/uL (ref 1.4–7.0)
Neutrophils: 78 %
Platelets: 197 10*3/uL (ref 150–450)
RBC: 3.99 x10E6/uL (ref 3.77–5.28)
RDW: 16.9 % — ABNORMAL HIGH (ref 11.7–15.4)
WBC: 7.8 10*3/uL (ref 3.4–10.8)

## 2020-04-13 LAB — LIPID PANEL
Chol/HDL Ratio: 3.5 ratio (ref 0.0–4.4)
Cholesterol, Total: 160 mg/dL (ref 100–199)
HDL: 46 mg/dL (ref >39)
LDL Chol Calc (NIH): 99 mg/dL (ref 0–99)
Triglycerides: 79 mg/dL (ref 0–149)
VLDL Cholesterol Cal: 15 mg/dL (ref 5–40)

## 2020-04-16 ENCOUNTER — Other Ambulatory Visit: Payer: Self-pay | Admitting: Nurse Practitioner

## 2020-04-16 DIAGNOSIS — D649 Anemia, unspecified: Secondary | ICD-10-CM

## 2020-04-16 NOTE — Progress Notes (Signed)
Ref gi  

## 2020-05-20 ENCOUNTER — Other Ambulatory Visit: Payer: Self-pay | Admitting: Family Medicine

## 2020-05-20 DIAGNOSIS — M542 Cervicalgia: Secondary | ICD-10-CM

## 2020-06-03 ENCOUNTER — Ambulatory Visit: Payer: Medicare Other | Admitting: Family Medicine

## 2020-06-04 ENCOUNTER — Ambulatory Visit: Payer: Medicare Other | Admitting: Family Medicine

## 2020-06-17 ENCOUNTER — Other Ambulatory Visit: Payer: Self-pay | Admitting: Family Medicine

## 2020-06-17 DIAGNOSIS — M542 Cervicalgia: Secondary | ICD-10-CM

## 2020-06-20 NOTE — Telephone Encounter (Signed)
Patient has a controlled substance agreement in place with me.  Patients are unable to switch between providers when they have a controlled substance agreement.  I did discuss this with Tiffany.  Please make patient aware.

## 2020-06-20 NOTE — Telephone Encounter (Signed)
lmtcb

## 2020-06-24 NOTE — Telephone Encounter (Signed)
Lmtcb No call back  This encounter will be closed  

## 2020-07-15 ENCOUNTER — Ambulatory Visit (INDEPENDENT_AMBULATORY_CARE_PROVIDER_SITE_OTHER): Payer: Medicare Other | Admitting: Family Medicine

## 2020-07-15 ENCOUNTER — Other Ambulatory Visit: Payer: Self-pay

## 2020-07-15 ENCOUNTER — Encounter: Payer: Self-pay | Admitting: Family Medicine

## 2020-07-15 ENCOUNTER — Telehealth: Payer: Self-pay

## 2020-07-15 DIAGNOSIS — F339 Major depressive disorder, recurrent, unspecified: Secondary | ICD-10-CM | POA: Insufficient documentation

## 2020-07-15 DIAGNOSIS — G8929 Other chronic pain: Secondary | ICD-10-CM | POA: Diagnosis not present

## 2020-07-15 DIAGNOSIS — M542 Cervicalgia: Secondary | ICD-10-CM

## 2020-07-15 DIAGNOSIS — R601 Generalized edema: Secondary | ICD-10-CM | POA: Diagnosis not present

## 2020-07-15 DIAGNOSIS — F331 Major depressive disorder, recurrent, moderate: Secondary | ICD-10-CM

## 2020-07-15 DIAGNOSIS — F419 Anxiety disorder, unspecified: Secondary | ICD-10-CM

## 2020-07-15 DIAGNOSIS — Z79899 Other long term (current) drug therapy: Secondary | ICD-10-CM

## 2020-07-15 DIAGNOSIS — M545 Low back pain, unspecified: Secondary | ICD-10-CM

## 2020-07-15 DIAGNOSIS — F5101 Primary insomnia: Secondary | ICD-10-CM | POA: Diagnosis not present

## 2020-07-15 MED ORDER — BACLOFEN 10 MG PO TABS: 10.0000 mg | ORAL_TABLET | Freq: Three times a day (TID) | ORAL | 3 refills | Status: DC | PRN

## 2020-07-15 MED ORDER — OXYCODONE HCL 10 MG PO TABS
10.0000 mg | ORAL_TABLET | Freq: Three times a day (TID) | ORAL | 0 refills | Status: DC | PRN
Start: 2020-09-15 — End: 2020-10-15

## 2020-07-15 MED ORDER — BUSPIRONE HCL 15 MG PO TABS: 15.0000 mg | ORAL_TABLET | Freq: Three times a day (TID) | ORAL | 2 refills | Status: DC

## 2020-07-15 MED ORDER — FUROSEMIDE 20 MG PO TABS: 20.0000 mg | ORAL_TABLET | ORAL | 3 refills | Status: DC | PRN

## 2020-07-15 MED ORDER — CELECOXIB 200 MG PO CAPS: 200.0000 mg | ORAL_CAPSULE | Freq: Every day | ORAL | 1 refills | Status: DC

## 2020-07-15 MED ORDER — OXYCODONE HCL 10 MG PO TABS: 10.0000 mg | ORAL_TABLET | Freq: Three times a day (TID) | ORAL | 0 refills | Status: AC | PRN

## 2020-07-15 MED ORDER — TRAZODONE HCL 100 MG PO TABS: 200.0000 mg | ORAL_TABLET | Freq: Every day | ORAL | 1 refills | Status: DC

## 2020-07-15 MED ORDER — ZIPRASIDONE HCL 20 MG PO CAPS: 20.0000 mg | ORAL_CAPSULE | Freq: Two times a day (BID) | ORAL | 1 refills | Status: DC

## 2020-07-15 MED ORDER — SERTRALINE HCL 100 MG PO TABS: 100.0000 mg | ORAL_TABLET | Freq: Two times a day (BID) | ORAL | 0 refills | Status: DC

## 2020-07-15 NOTE — Progress Notes (Signed)
Established Patient Office Visit  Subjective:  Patient ID: Hannah [name], female    DOB: [birth year]  Age: 60 y.o. MRN: [id number]  CC:  Chief Complaint  Patient presents with  . Medical Management of Chronic Issues    HPI Hannah [name] presents for chronic follow up. She has no new concerns today.   1. Depression/anxiety Reports doing well with Zoloft and geodon. Buspar TID for anxiety as well. Depression screen [facility] 2/9 07/15/2020 04/12/2020 03/26/2020  Decreased Interest 0 1 0  Down, Depressed, Hopeless 0 2 1  PHQ - 2 Score 0 3 1  Altered sleeping 0 0 -  Tired, decreased energy 1 1 -  Change in appetite 1 1 -  Feeling bad or failure about yourself  0 1 -  Trouble concentrating 0 0 -  Moving slowly or fidgety/restless 0 0 -  Suicidal thoughts 0 0 -  PHQ-9 Score 2 6 -  Difficult doing work/chores Not difficult at all Not difficult at all -   GAD 7 : Generalized Anxiety Score 04/12/2020 01/12/2020  Nervous, Anxious, on Edge 0 1  Control/stop worrying 1 1  Worry too much - different things 1 1  Trouble relaxing 0 0  Restless 0 0  Easily annoyed or irritable 1 1  Afraid - awful might happen 0 0  Total GAD 7 Score 3 4  Anxiety Difficulty Not difficult at all -    2. Obesity 10 lb weight gain since last visit.   3. Chronic back pain Takes oxycodone for chronic back pain. 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    Pain assessment: Cause of pain- forklift injury when she was in her early 20's Pain location- low back Pain on scale of 1-10- 4/10 currently Frequency- daily What increases pain-house work What makes pain Better-resting Effects on ADL - none Any change in general medical condition-none  Current opioids rx- oxycodone 10 TID # meds rx- 90 Effectiveness of current meds-helps Adverse reactions from pain meds-none Morphine equivalent- 45MME  Pill count performed-No Last drug screen - 01/12/20 ( high risk q100m moderate risk [id number] risk  yearly ) Urine drug screen today- No Was the [place]- yes             If yes were their any concerning findings? - none   Overdose risk: 1 Opioid Risk  01/18/2020  Alcohol 3  Illegal Drugs 0  Rx Drugs 0  Alcohol 3  Illegal Drugs 0  Rx Drugs 0  Age between 16-45 years  0  History of Preadolescent Sexual Abuse 3  Psychological Disease 0  Depression 1  Opioid Risk Tool Scoring 10  Opioid Risk Interpretation High Risk   Pain contract signed: 01/23/20  5. Insomnia Trazadone is working well for her insomnia.    Past Medical History:  Diagnosis Date  . Acquired dilation of bile duct   . Alcohol abuse   . Anxiety   . Blood type O+   . Chronic neck and back pain   . Compulsive skin picking 1998  . Depression   . Drug abuse ([place]  . GERD (gastroesophageal reflux disease)   . History of iron deficiency anemia   . Hyperlipidemia   . Insomnia   . Migraines   . Osteoporosis 07/08/2017   T-score -2.6 in 2019; -2.2 on 08/05/2018  . Vitamin D deficiency     Past Surgical History:  Procedure Laterality Date  . ABDOMINAL  HYSTERECTOMY  09/07/2006  . CESAREAN SECTION  02/16/1990  . CHOLECYSTECTOMY  2001  . ENDOMETRIAL ABLATION W/ NOVASURE    . GASTRIC BYPASS  1998  . [place]  2003  . HERNIA REPAIR  2004  . KNEE ARTHROSCOPY Right 2008  . MOUTH SURGERY    . 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 Anxiety disorder Mother   . Depression Mother   . Breast cancer Mother   . Ovarian cancer Mother   . Alcohol abuse Father   . Heart disease Father   . Kidney disease Brother   . Heart disease Brother   . Breast cancer Maternal Aunt   . Breast cancer Maternal Grandmother   . Breast cancer Maternal Grandfather     Social History   Socioeconomic History  . Marital status: Single    Spouse name: Not on file  . Number of children: Not on file  . Years of education: Not on file  . Highest education  level: Not on file  Occupational History  . Not on file  Tobacco Use  . Smoking status: Former Smoker    Types: Cigarettes    Quit date: 1991    Years since quitting: 31.1  . Smokeless tobacco: Never Used  Vaping Use  . Vaping Use: Never used  Substance and Sexual Activity  . Alcohol use: Not Currently  . Drug use: Not Currently  . Sexual activity: Not Currently  Other Topics Concern  . Not on file  Social History Narrative  . Not on file   Social Determinants of Health   Financial Resource Strain: Not on file  Food Insecurity: Not on file  Transportation Needs: Not on file  Physical Activity: Not on file  Stress: Not on file  Social Connections: Not on file  Intimate Partner Violence: Not on file    Outpatient Medications Prior to Visit  Medication Sig Dispense Refill  . B Complex-C (SUPER B COMPLEX PO) Take 1 tablet by mouth daily.    . baclofen (LIORESAL) 10 MG tablet Take 1 tablet by mouth three times daily as needed for muscle spasm 90 tablet 0  . busPIRone (BUSPAR) 15 MG tablet Take 1 tablet (15 mg total) by mouth in the morning, at noon, and at bedtime. 90 tablet 2  . celecoxib (CELEBREX) 200 MG capsule Take 1 capsule (200 mg total) by mouth daily. 90 capsule 1  . Cholecalciferol (VITAMIN D3) 125 MCG (5000 UT) CAPS Take 1 capsule by mouth daily.    . diphenhydrAMINE (BENADRYL) 25 mg capsule Take 50 mg by mouth daily as needed.    . famotidine (PEPCID) 20 MG tablet Take 20 mg by mouth daily.    . Oxycodone HCl 10 MG TABS Take 1 tablet (10 mg total) by mouth every 8 (eight) hours as needed. 90 tablet 0  . sertraline (ZOLOFT) 100 MG tablet Take 1 tablet (100 mg total) by mouth in the morning and at bedtime. 180 tablet 0  . traZODone (DESYREL) 100 MG tablet Take 2 tablets (200 mg total) by mouth at bedtime. 90 tablet 1  . ziprasidone (GEODON) 20 MG capsule Take 1 capsule (20 mg total) by mouth in the morning and at bedtime. 180 capsule 1   No facility-administered  medications prior to visit.    Allergies  Allergen Reactions  . Demerol [Meperidine] Other (See Comments)    disorientation   . Other Other (See Comments)  Disoriented and nausea  . Morphine Palpitations    SLOW HEART RATE     ROS Review of Systems Negative unless specially indicated above in HPI.    Objective:    Physical Exam Constitutional:      General: She is not in acute distress.    Appearance: She is well-developed. She is not ill-appearing, toxic-appearing or diaphoretic.  Neck:     Thyroid: No thyromegaly.     Vascular: No carotid bruit or JVD.     Trachea: Trachea normal.  Cardiovascular:     Rate and Rhythm: Normal rate and regular rhythm.     Heart sounds: Normal heart sounds. No murmur heard. No friction rub. No gallop.   Pulmonary:     Effort: Pulmonary effort is normal.     Breath sounds: Normal breath sounds.  Abdominal:     General: Bowel sounds are normal. There is no distension.     Palpations: Abdomen is soft. There is no mass.     Tenderness: There is no abdominal tenderness.  Musculoskeletal:        General: Normal range of motion.     Cervical back: Full passive range of motion without pain, normal range of motion and neck supple.     Right lower leg: 1+ Edema present.     Left lower leg: 1+ Edema present.  Lymphadenopathy:     Cervical: No cervical adenopathy.  Skin:    General: Skin is warm and dry.  Neurological:     General: No focal deficit present.     Mental Status: She is alert and oriented to person, place, and time.     Deep Tendon Reflexes: Reflexes are normal and symmetric.  Psychiatric:        Behavior: Behavior normal.        Thought Content: Thought content normal.        Judgment: Judgment normal.     BP 121/77   Pulse 81   Temp 97.8 F (36.6 C) (Temporal)   Ht 5' 5"  (1.651 m)   Wt 284 lb 2 oz (128.9 kg)   BMI 47.28 kg/m  Wt Readings from Last 3 Encounters:  07/15/20 284 lb 2 oz (128.9 kg)  04/12/20 275  lb 4 oz (124.9 kg)  03/26/20 277 lb 6 oz (125.8 kg)     Health Maintenance Due  Topic Date Due  . COLONOSCOPY (Pts 45-18yr Insurance coverage will need to be confirmed)  Never done  . MAMMOGRAM  Never done    There are no preventive care reminders to display for this patient.  Lab Results  Component Value Date   TSH 1.70 08/11/2019   Lab Results  Component Value Date   WBC 7.8 04/12/2020   HGB 10.0 (L) 04/12/2020   HCT 32.1 (L) 04/12/2020   MCV 81 04/12/2020   PLT 197 04/12/2020   Lab Results  Component Value Date   NA 144 04/12/2020   K 4.3 04/12/2020   CO2 25 04/12/2020   GLUCOSE 83 04/12/2020   BUN 8 04/12/2020   CREATININE 0.91 04/12/2020   BILITOT 0.3 04/12/2020   ALKPHOS 94 04/12/2020   AST 17 04/12/2020   ALT 16 04/12/2020   PROT 6.4 04/12/2020   ALBUMIN 4.0 04/12/2020   CALCIUM 9.9 04/12/2020   Lab Results  Component Value Date   CHOL 160 04/12/2020   Lab Results  Component Value Date   HDL 46 04/12/2020   Lab Results  Component Value Date  [place] 99 04/12/2020   Lab Results  Component Value Date   TRIG 79 04/12/2020   Lab Results  Component Value Date   CHOLHDL 3.5 04/12/2020   Lab Results  Component Value Date   HGBA1C 5.0 08/11/2019      Assessment & Plan:  Saralee was seen today for medical management of chronic issues.  Diagnoses and all orders for this visit:  Morbid obesity (Center) Diet and exercise. Labs pending as below.  -     CBC with Differential/Platelet -     CMP14+EGFR -     Lipid panel  Anxiety Well controlled on current regimen.  -     busPIRone (BUSPAR) 15 MG tablet; Take 1 tablet (15 mg total) by mouth in the morning, at noon, and at bedtime.  Moderate episode of recurrent major depressive disorder (HCC) Well controlled on current regimen.  -     sertraline (ZOLOFT) 100 MG tablet; Take 1 tablet (100 mg total) by mouth in the morning and at bedtime. -     ziprasidone (GEODON) 20 MG capsule; Take 1 capsule (20  mg total) by mouth in the morning and at bedtime.  Chronic bilateral low back pain without sciatica/Controlled substance agreement signed Well controlled on current regimen. New CSA signed today. Last toxassure 8/21. PDMP reviewed, no red flags.  -     celecoxib (CELEBREX) 200 MG capsule; Take 1 capsule (200 mg total) by mouth daily. -     Oxycodone HCl 10 MG TABS; Take 1 tablet (10 mg total) by mouth every 8 (eight) hours as needed. -     Oxycodone HCl 10 MG TABS; Take 1 tablet (10 mg total) by mouth every 8 (eight) hours as needed. -     Oxycodone HCl 10 MG TABS; Take 1 tablet (10 mg total) by mouth every 8 (eight) hours as needed.  Chronic neck pain Well controlled on current regimen.  -     baclofen (LIORESAL) 10 MG tablet; Take 1 tablet (10 mg total) by mouth 3 (three) times daily as needed. for muscle spams  Primary insomnia Well controlled on current regimen.  -     traZODone (DESYREL) 100 MG tablet; Take 2 tablets (200 mg total) by mouth at bedtime.  Generalized edema Low salt diet. Elevation, compression stockings.  -     furosemide (LASIX) 20 MG tablet; Take 1 tablet (20 mg total) by mouth as needed for edema.   Follow-up: Return in about 3 months (around 10/12/2020) for chronic follow up.   The patient indicates understanding of these issues and agrees with the plan.  Gwenlyn Perking, FNP

## 2020-07-15 NOTE — Patient Instructions (Signed)
https://www.nhlbi.nih.gov/files/docs/public/heart/dash_brief.pdf">  DASH Eating Plan DASH stands for Dietary Approaches to Stop Hypertension. The DASH eating plan is a healthy eating plan that has been shown to:  Reduce high blood pressure (hypertension).  Reduce your risk for type 2 diabetes, heart disease, and stroke.  Help with weight loss. What are tips for following this plan? Reading food labels  Check food labels for the amount of salt (sodium) per serving. Choose foods with less than 5 percent of the Daily Value of sodium. Generally, foods with less than 300 milligrams (mg) of sodium per serving fit into this eating plan.  To find whole grains, look for the word "whole" as the first word in the ingredient list. Shopping  Buy products labeled as "low-sodium" or "no salt added."  Buy fresh foods. Avoid canned foods and pre-made or frozen meals. Cooking  Avoid adding salt when cooking. Use salt-free seasonings or herbs instead of table salt or sea salt. Check with your health care provider or pharmacist before using salt substitutes.  Do not fry foods. Cook foods using healthy methods such as baking, boiling, grilling, roasting, and broiling instead.  Cook with heart-healthy oils, such as olive, canola, avocado, soybean, or sunflower oil. Meal planning  Eat a balanced diet that includes: ? 4 or more servings of fruits and 4 or more servings of vegetables each day. Try to fill one-half of your plate with fruits and vegetables. ? 6-8 servings of whole grains each day. ? Less than 6 oz (170 g) of lean meat, poultry, or fish each day. A 3-oz (85-g) serving of meat is about the same size as a deck of cards. One egg equals 1 oz (28 g). ? 2-3 servings of low-fat dairy each day. One serving is 1 cup (237 mL). ? 1 serving of nuts, seeds, or beans 5 times each week. ? 2-3 servings of heart-healthy fats. Healthy fats called omega-3 fatty acids are found in foods such as walnuts,  flaxseeds, fortified milks, and eggs. These fats are also found in cold-water fish, such as sardines, salmon, and mackerel.  Limit how much you eat of: ? Canned or prepackaged foods. ? Food that is high in trans fat, such as some fried foods. ? Food that is high in saturated fat, such as fatty meat. ? Desserts and other sweets, sugary drinks, and other foods with added sugar. ? Full-fat dairy products.  Do not salt foods before eating.  Do not eat more than 4 egg yolks a week.  Try to eat at least 2 vegetarian meals a week.  Eat more home-cooked food and less restaurant, buffet, and fast food.   Lifestyle  When eating at a restaurant, ask that your food be prepared with less salt or no salt, if possible.  If you drink alcohol: ? Limit how much you use to:  0-1 drink a day for women who are not pregnant.  0-2 drinks a day for men. ? Be aware of how much alcohol is in your drink. In the [place] one drink equals one 12 oz bottle of beer (355 mL), one 5 oz glass of wine (148 mL), or one 1 oz glass of hard liquor (44 mL). General information  Avoid eating more than 2,300 mg of salt a day. If you have hypertension, you may need to reduce your sodium intake to 1,500 mg a day.  Work with your health care provider to maintain a healthy body weight or to lose weight. Ask what an ideal weight is for   you.  Get at least 30 minutes of exercise that causes your heart to beat faster (aerobic exercise) most days of the week. Activities may include walking, swimming, or biking.  Work with your health care provider or dietitian to adjust your eating plan to your individual calorie needs. What foods should I eat? Fruits All fresh, dried, or frozen fruit. Canned fruit in natural juice (without added sugar). Vegetables Fresh or frozen vegetables (raw, steamed, roasted, or grilled). Low-sodium or reduced-sodium tomato and vegetable juice. Low-sodium or reduced-sodium tomato sauce and tomato paste.  Low-sodium or reduced-sodium canned vegetables. Grains Whole-grain or whole-wheat bread. Whole-grain or whole-wheat pasta. Brown rice. Oatmeal. Quinoa. Bulgur. Whole-grain and low-sodium cereals. Pita bread. Low-fat, low-sodium crackers. Whole-wheat flour tortillas. Meats and other proteins Skinless chicken or turkey. Ground chicken or turkey. Pork with fat trimmed off. Fish and seafood. Egg whites. Dried beans, peas, or lentils. Unsalted nuts, nut butters, and seeds. Unsalted canned beans. Lean cuts of beef with fat trimmed off. Low-sodium, lean precooked or cured meat, such as sausages or meat loaves. Dairy Low-fat (1%) or fat-free (skim) milk. Reduced-fat, low-fat, or fat-free cheeses. Nonfat, low-sodium ricotta or cottage cheese. Low-fat or nonfat yogurt. Low-fat, low-sodium cheese. Fats and oils Soft margarine without trans fats. Vegetable oil. Reduced-fat, low-fat, or light mayonnaise and salad dressings (reduced-sodium). Canola, safflower, olive, avocado, soybean, and sunflower oils. Avocado. Seasonings and condiments Herbs. Spices. Seasoning mixes without salt. Other foods Unsalted popcorn and pretzels. Fat-free sweets. The items listed above may not be a complete list of foods and beverages you can eat. Contact a dietitian for more information. What foods should I avoid? Fruits Canned fruit in a light or heavy syrup. Fried fruit. Fruit in cream or butter sauce. Vegetables Creamed or fried vegetables. Vegetables in a cheese sauce. Regular canned vegetables (not low-sodium or reduced-sodium). Regular canned tomato sauce and paste (not low-sodium or reduced-sodium). Regular tomato and vegetable juice (not low-sodium or reduced-sodium). Pickles. Olives. Grains Baked goods made with fat, such as croissants, muffins, or some breads. Dry pasta or rice meal packs. Meats and other proteins Fatty cuts of meat. Ribs. Fried meat. Bacon. Bologna, salami, and other precooked or cured meats, such as  sausages or meat loaves. Fat from the back of a pig (fatback). Bratwurst. Salted nuts and seeds. Canned beans with added salt. Canned or smoked fish. Whole eggs or egg yolks. Chicken or turkey with skin. Dairy Whole or 2% milk, cream, and half-and-half. Whole or full-fat cream cheese. Whole-fat or sweetened yogurt. Full-fat cheese. Nondairy creamers. Whipped toppings. Processed cheese and cheese spreads. Fats and oils Butter. Stick margarine. Lard. Shortening. Ghee. Bacon fat. Tropical oils, such as coconut, palm kernel, or palm oil. Seasonings and condiments Onion salt, garlic salt, seasoned salt, table salt, and sea salt. Worcestershire sauce. Tartar sauce. Barbecue sauce. Teriyaki sauce. Soy sauce, including reduced-sodium. Steak sauce. Canned and packaged gravies. Fish sauce. Oyster sauce. Cocktail sauce. Store-bought horseradish. Ketchup. Mustard. Meat flavorings and tenderizers. Bouillon cubes. Hot sauces. Pre-made or packaged marinades. Pre-made or packaged taco seasonings. Relishes. Regular salad dressings. Other foods Salted popcorn and pretzels. The items listed above may not be a complete list of foods and beverages you should avoid. Contact a dietitian for more information. Where to find more information  National Heart, Lung, and Blood Institute: www.nhlbi.nih.gov  American Heart Association: www.heart.org  Academy of Nutrition and Dietetics: www.eatright.org  National Kidney Foundation: www.kidney.org Summary  The DASH eating plan is a healthy eating plan that has been shown to   reduce high blood pressure (hypertension). It may also reduce your risk for type 2 diabetes, heart disease, and stroke.  When on the DASH eating plan, aim to eat more fresh fruits and vegetables, whole grains, lean proteins, low-fat dairy, and heart-healthy fats.  With the DASH eating plan, you should limit salt (sodium) intake to 2,300 mg a day. If you have hypertension, you may need to reduce your  sodium intake to 1,500 mg a day.  Work with your health care provider or dietitian to adjust your eating plan to your individual calorie needs. This information is not intended to replace advice given to you by your health care provider. Make sure you discuss any questions you have with your health care provider. Document Revised: 04/28/2019 Document Reviewed: 04/28/2019 Elsevier Patient Education  2021 [employer]  Cooking With Less Salt Cooking with less salt is one way to reduce the amount of sodium you get from food. Sodium is one of the elements that make up salt. It is found naturally in foods and is also added to certain foods. Depending on your condition and overall health, your health care provider or dietitian may recommend that you reduce your sodium intake. Most people should have less than 2,300 milligrams (mg) of sodium each day. If you have high blood pressure (hypertension), you may need to limit your sodium to 1,500 mg each day. Follow the tips below to help reduce your sodium intake. What are tips for eating less sodium? Reading food labels  Check the food label before buying or using packaged ingredients. Always check the label for the serving size and sodium content.  Look for products with no more than 140 mg of sodium in one serving.  Check the % Daily Value column to see what percent of the daily recommended amount of sodium is provided in one serving of the product. Foods with 5% or less in this column are considered low in sodium. Foods with 20% or higher are considered high in sodium.  Do not choose foods with salt as one of the first three ingredients on the ingredients list. If salt is one of the first three ingredients, it usually means the item is high in sodium.   Shopping  Buy sodium-free or low-sodium products. Look for the following words on food labels: ? Low-sodium. ? Sodium-free. ? Reduced-sodium. ? No salt added. ? Unsalted.  Always check the  sodium content even if foods are labeled as low-sodium or no salt added.  Buy fresh foods. Cooking  Use herbs, seasonings without salt, and spices as substitutes for salt.  Use sodium-free baking soda when baking.  Grill, braise, or roast foods to add flavor with less salt.  Avoid adding salt to pasta, rice, or hot cereals.  Drain and rinse canned vegetables, beans, and meat before use.  Avoid adding salt when cooking sweets and desserts.  Cook with low-sodium ingredients. What foods are high in sodium? Vegetables Regular canned vegetables (not low-sodium or reduced-sodium). Sauerkraut, pickled vegetables, and relishes. Olives. French fries. Onion rings. Regular canned tomato sauce and paste. Regular tomato and vegetable juice. Frozen vegetables in sauces. Grains Instant hot cereals. Bread stuffing, pancake, and biscuit mixes. Croutons. Seasoned rice or pasta mixes. Noodle soup cups. Boxed or frozen macaroni and cheese. Regular salted crackers. Self-rising flour. Rolls. Bagels. Flour tortillas and wraps. Meats and other proteins Meat or fish that is salted, canned, smoked, cured, spiced, or pickled. This includes bacon, ham, sausages, hot dogs, corned beef, chipped beef, meat   loaves, salt pork, jerky, pickled herring, anchovies, regular canned tuna, and sardines. Salted nuts. Dairy Processed cheese and cheese spreads. Cheese curds. Blue cheese. Feta cheese. String cheese. Regular cottage cheese. Buttermilk. Canned milk. The items listed above may not be a complete list of foods high in sodium. Actual amounts of sodium may be different depending on processing. Contact a dietitian for more information. What foods are low in sodium? Fruits Fresh, frozen, or canned fruit with no sauce added. Fruit juice. Vegetables Fresh or frozen vegetables with no sauce added. "No salt added" canned vegetables. "No salt added" tomato sauce and paste. Low-sodium or reduced-sodium tomato and vegetable  juice. Grains Noodles, pasta, quinoa, rice. Shredded or puffed wheat or puffed rice. Regular or quick oats (not instant). Low-sodium crackers. Low-sodium bread. Whole-grain bread and whole-grain pasta. Unsalted popcorn. Meats and other proteins Fresh or frozen whole meats, poultry (not injected with sodium), and fish with no sauce added. Unsalted nuts. Dried peas, beans, and lentils without added salt. Unsalted canned beans. Eggs. Unsalted nut butters. Low-sodium canned tuna or chicken. Dairy Milk. Soy milk. Yogurt. Low-sodium cheeses, such as Swiss, Monterey Jack, mozzarella, and ricotta. Sherbet or ice cream (keep to  cup per serving). Cream cheese. Fats and oils Unsalted butter or margarine. Other foods Homemade pudding. Sodium-free baking soda and baking powder. Herbs and spices. Low-sodium seasoning mixes. Beverages Coffee and tea. Carbonated beverages. The items listed above may not be a complete list of foods low in sodium. Actual amounts of sodium may be different depending on processing. Contact a dietitian for more information. What are some salt alternatives when cooking? The following are herbs, seasonings, and spices that can be used instead of salt to flavor your food. Herbs should be fresh or dried. Do not choose packaged mixes. Next to the name of the herb, spice, or seasoning are some examples of foods you can pair it with. Herbs  Bay leaves - Soups, meat and vegetable dishes, and spaghetti sauce.  Basil - Italian dishes, soups, pasta, and fish dishes.  Cilantro - Meat, poultry, and vegetable dishes.  Chili powder - Marinades and Mexican dishes.  Chives - Salad dressings and potato dishes.  Cumin - Mexican dishes, couscous, and meat dishes.  Dill - Fish dishes, sauces, and salads.  Fennel - Meat and vegetable dishes, breads, and cookies.  Garlic (do not use garlic salt) - Italian dishes, meat dishes, salad dressings, and sauces.  Marjoram - Soups, potato dishes,  and meat dishes.  Oregano - Pizza and spaghetti sauce.  Parsley - Salads, soups, pasta, and meat dishes.  Rosemary - Italian dishes, salad dressings, soups, and red meats.  Saffron - Fish dishes, pasta, and some poultry dishes.  Sage - Stuffings and sauces.  Tarragon - Fish and poultry dishes.  Thyme - Stuffing, meat, and fish dishes. Seasonings  Lemon juice - Fish dishes, poultry dishes, vegetables, and salads.  Vinegar - Salad dressings, vegetables, and fish dishes. Spices  Cinnamon - Sweet dishes, such as cakes, cookies, and puddings.  Cloves - Gingerbread, puddings, and marinades for meats.  Curry - Vegetable dishes, fish and poultry dishes, and stir-fry dishes.  Ginger - Vegetable dishes, fish dishes, and stir-fry dishes.  Nutmeg - Pasta, vegetables, poultry, fish dishes, and custard. Summary  Cooking with less salt is one way to reduce the amount of sodium that you get from food.  Buy sodium-free or low-sodium products.  Check the food label before using or buying packaged ingredients.  Use herbs, seasonings without   and spices as substitutes for salt in foods. This information is not intended to replace advice given to you by your health care provider. Make sure you discuss any questions you have with your health care provider. Document Revised: 05/17/2019 Document Reviewed: 05/17/2019 Elsevier Patient Education  2021 [employer].

## 2020-07-16 ENCOUNTER — Ambulatory Visit: Payer: Medicare Other

## 2020-07-16 LAB — CBC WITH DIFFERENTIAL/PLATELET
Basophils Absolute: 0.1 10*3/uL (ref 0.0–0.2)
Basos: 1 %
EOS (ABSOLUTE): 0.2 10*3/uL (ref 0.0–0.4)
Eos: 2 %
Hematocrit: 33.4 % — ABNORMAL LOW (ref 34.0–46.6)
Hemoglobin: 10.2 g/dL — ABNORMAL LOW (ref 11.1–15.9)
Immature Grans (Abs): 0 10*3/uL (ref 0.0–0.1)
Immature Granulocytes: 0 %
Lymphocytes Absolute: 1 10*3/uL (ref 0.7–3.1)
Lymphs: 12 %
MCH: 25.5 pg — ABNORMAL LOW (ref 26.6–33.0)
MCHC: 30.5 g/dL — ABNORMAL LOW (ref 31.5–35.7)
MCV: 84 fL (ref 79–97)
Monocytes Absolute: 0.6 10*3/uL (ref 0.1–0.9)
Monocytes: 7 %
Neutrophils Absolute: 6.8 10*3/uL (ref 1.4–7.0)
Neutrophils: 78 %
Platelets: 216 10*3/uL (ref 150–450)
RBC: 4 x10E6/uL (ref 3.77–5.28)
RDW: 15.5 % — ABNORMAL HIGH (ref 11.7–15.4)
WBC: 8.7 10*3/uL (ref 3.4–10.8)

## 2020-07-16 LAB — CMP14+EGFR
ALT: 14 IU/L (ref 0–32)
AST: 20 IU/L (ref 0–40)
Albumin/Globulin Ratio: 1.5 (ref 1.2–2.2)
Albumin: 3.9 g/dL (ref 3.8–4.9)
Alkaline Phosphatase: 85 IU/L (ref 44–121)
BUN/Creatinine Ratio: 12 (ref 9–23)
BUN: 12 mg/dL (ref 6–24)
Bilirubin Total: 0.3 mg/dL (ref 0.0–1.2)
CO2: 24 mmol/L (ref 20–29)
Calcium: 10.2 mg/dL (ref 8.7–10.2)
Chloride: 105 mmol/L (ref 96–106)
Creatinine, Ser: 1.04 mg/dL — ABNORMAL HIGH (ref 0.57–1.00)
GFR calc Af Amer: 68 mL/min/{1.73_m2} (ref >59)
GFR calc non Af Amer: 59 mL/min/{1.73_m2} — ABNORMAL LOW (ref >59)
Globulin, Total: 2.6 g/dL (ref 1.5–4.5)
Glucose: 86 mg/dL (ref 65–99)
Potassium: 4.3 mmol/L (ref 3.5–5.2)
Sodium: 142 mmol/L (ref 134–144)
Total Protein: 6.5 g/dL (ref 6.0–8.5)

## 2020-07-16 LAB — LIPID PANEL
Chol/HDL Ratio: 3.6 ratio (ref 0.0–4.4)
Cholesterol, Total: 154 mg/dL (ref 100–199)
HDL: 43 mg/dL (ref >39)
LDL Chol Calc (NIH): 97 mg/dL (ref 0–99)
Triglycerides: 70 mg/dL (ref 0–149)
VLDL Cholesterol Cal: 14 mg/dL (ref 5–40)

## 2020-07-23 ENCOUNTER — Ambulatory Visit: Payer: Medicare Other

## 2020-10-10 ENCOUNTER — Ambulatory Visit (INDEPENDENT_AMBULATORY_CARE_PROVIDER_SITE_OTHER): Payer: Medicare Other

## 2020-10-10 ENCOUNTER — Encounter: Payer: Self-pay | Admitting: Family Medicine

## 2020-10-10 ENCOUNTER — Ambulatory Visit (INDEPENDENT_AMBULATORY_CARE_PROVIDER_SITE_OTHER): Payer: Medicare Other | Admitting: Family Medicine

## 2020-10-10 ENCOUNTER — Other Ambulatory Visit: Payer: Self-pay

## 2020-10-10 VITALS — BP 117/72 | HR 71 | Temp 98.0°F | Ht 65.0 in | Wt 282.2 lb

## 2020-10-10 DIAGNOSIS — M25562 Pain in left knee: Secondary | ICD-10-CM

## 2020-10-10 DIAGNOSIS — R7989 Other specified abnormal findings of blood chemistry: Secondary | ICD-10-CM | POA: Diagnosis not present

## 2020-10-10 DIAGNOSIS — Z0000 Encounter for general adult medical examination without abnormal findings: Secondary | ICD-10-CM

## 2020-10-10 DIAGNOSIS — R34 Anuria and oliguria: Secondary | ICD-10-CM | POA: Diagnosis not present

## 2020-10-10 LAB — CBC WITH DIFFERENTIAL/PLATELET
Basophils Absolute: 0.1 10*3/uL (ref 0.0–0.2)
Basos: 1 %
EOS (ABSOLUTE): 0.1 10*3/uL (ref 0.0–0.4)
Eos: 2 %
Hematocrit: 34.6 % (ref 34.0–46.6)
Hemoglobin: 10.9 g/dL — ABNORMAL LOW (ref 11.1–15.9)
Immature Grans (Abs): 0 10*3/uL (ref 0.0–0.1)
Immature Granulocytes: 0 %
Lymphocytes Absolute: 0.9 10*3/uL (ref 0.7–3.1)
Lymphs: 14 %
MCH: 26.7 pg (ref 26.6–33.0)
MCHC: 31.5 g/dL (ref 31.5–35.7)
MCV: 85 fL (ref 79–97)
Monocytes Absolute: 0.4 10*3/uL (ref 0.1–0.9)
Monocytes: 7 %
Neutrophils Absolute: 4.6 10*3/uL (ref 1.4–7.0)
Neutrophils: 76 %
Platelets: 187 10*3/uL (ref 150–450)
RBC: 4.09 x10E6/uL (ref 3.77–5.28)
RDW: 15.6 % — ABNORMAL HIGH (ref 11.7–15.4)
WBC: 6.1 10*3/uL (ref 3.4–10.8)

## 2020-10-10 LAB — CMP14+EGFR
ALT: 16 IU/L (ref 0–32)
AST: 19 IU/L (ref 0–40)
Albumin/Globulin Ratio: 1.5 (ref 1.2–2.2)
Albumin: 3.9 g/dL (ref 3.8–4.9)
Alkaline Phosphatase: 87 IU/L (ref 44–121)
BUN/Creatinine Ratio: 10 (ref 9–23)
BUN: 10 mg/dL (ref 6–24)
Bilirubin Total: 0.4 mg/dL (ref 0.0–1.2)
CO2: 25 mmol/L (ref 20–29)
Calcium: 10.2 mg/dL (ref 8.7–10.2)
Chloride: 104 mmol/L (ref 96–106)
Creatinine, Ser: 0.96 mg/dL (ref 0.57–1.00)
Globulin, Total: 2.6 g/dL (ref 1.5–4.5)
Glucose: 96 mg/dL (ref 65–99)
Potassium: 4.1 mmol/L (ref 3.5–5.2)
Sodium: 141 mmol/L (ref 134–144)
Total Protein: 6.5 g/dL (ref 6.0–8.5)
eGFR: 68 mL/min/{1.73_m2} (ref >59)

## 2020-10-10 LAB — LIPID PANEL
Chol/HDL Ratio: 3.5 ratio (ref 0.0–4.4)
Cholesterol, Total: 164 mg/dL (ref 100–199)
HDL: 47 mg/dL (ref >39)
LDL Chol Calc (NIH): 104 mg/dL — ABNORMAL HIGH (ref 0–99)
Triglycerides: 66 mg/dL (ref 0–149)
VLDL Cholesterol Cal: 13 mg/dL (ref 5–40)

## 2020-10-10 LAB — BAYER DCA HB A1C WAIVED: HB A1C (BAYER DCA - WAIVED): 4.9 % (ref <7.0)

## 2020-10-10 MED ORDER — SEMAGLUTIDE-WEIGHT MANAGEMENT 0.25 MG/0.5ML ~~LOC~~ SOAJ: 0.2500 mg | SUBCUTANEOUS | 0 refills | Status: DC

## 2020-10-10 NOTE — Patient Instructions (Signed)
Calorie Counting for Weight Loss Calories are units of energy. Your body needs a certain number of calories from food to keep going throughout the day. When you eat or drink more calories than your body needs, your body stores the extra calories mostly as fat. When you eat or drink fewer calories than your body needs, your body burns fat to get the energy it needs. Calorie counting means keeping track of how many calories you eat and drink each day. Calorie counting can be helpful if you need to lose weight. If you eat fewer calories than your body needs, you should lose weight. Ask your health care provider what a healthy weight is for you. For calorie counting to work, you will need to eat the right number of calories each day to lose a healthy amount of weight per week. A dietitian can help you figure out how many calories you need in a day and will suggest ways to reach your calorie goal.  A healthy amount of weight to lose each week is usually 1-2 lb (0.5-0.9 kg). This usually means that your daily calorie intake should be reduced by 500-750 calories.  Eating 1,200-1,500 calories a day can help most women lose weight.  Eating 1,500-1,800 calories a day can help most men lose weight. What do I need to know about calorie counting? Work with your health care provider or dietitian to determine how many calories you should get each day. To meet your daily calorie goal, you will need to:  Find out how many calories are in each food that you would like to eat. Try to do this before you eat.  Decide how much of the food you plan to eat.  Keep a food log. Do this by writing down what you ate and how many calories it had. To successfully lose weight, it is important to balance calorie counting with a healthy lifestyle that includes regular activity. Where do I find calorie information? The number of calories in a food can be found on a Nutrition Facts label. If a food does not have a Nutrition Facts  label, try to look up the calories online or ask your dietitian for help. Remember that calories are listed per serving. If you choose to have more than one serving of a food, you will have to multiply the calories per serving by the number of servings you plan to eat. For example, the label on a package of bread might say that a serving size is 1 slice and that there are 90 calories in a serving. If you eat 1 slice, you will have eaten 90 calories. If you eat 2 slices, you will have eaten 180 calories.   How do I keep a food log? After each time that you eat, record the following in your food log as soon as possible:  What you ate. Be sure to include toppings, sauces, and other extras on the food.  How much you ate. This can be measured in cups, ounces, or number of items.  How many calories were in each food and drink.  The total number of calories in the food you ate. Keep your food log near you, such as in a pocket-sized notebook or on an app or website on your mobile phone. Some programs will calculate calories for you and show you how many calories you have left to meet your daily goal. What are some portion-control tips?  Know how many calories are in a serving. This will   help you know how many servings you can have of a certain food.  Use a measuring cup to measure serving sizes. You could also try weighing out portions on a kitchen scale. With time, you will be able to estimate serving sizes for some foods.  Take time to put servings of different foods on your favorite plates or in your favorite bowls and cups so you know what a serving looks like.  Try not to eat straight from a food's packaging, such as from a bag or box. Eating straight from the package makes it hard to see how much you are eating and can lead to overeating. Put the amount you would like to eat in a cup or on a plate to make sure you are eating the right portion.  Use smaller plates, glasses, and bowls for smaller  portions and to prevent overeating.  Try not to multitask. For example, avoid watching TV or using your computer while eating. If it is time to eat, sit down at a table and enjoy your food. This will help you recognize when you are full. It will also help you be more mindful of what and how much you are eating. What are tips for following this plan? Reading food labels  Check the calorie count compared with the serving size. The serving size may be smaller than what you are used to eating.  Check the source of the calories. Try to choose foods that are high in protein, fiber, and vitamins, and low in saturated fat, trans fat, and sodium. Shopping  Read nutrition labels while you shop. This will help you make healthy decisions about which foods to buy.  Pay attention to nutrition labels for low-fat or fat-free foods. These foods sometimes have the same number of calories or more calories than the full-fat versions. They also often have added sugar, starch, or salt to make up for flavor that was removed with the fat.  Make a grocery list of lower-calorie foods and stick to it. Cooking  Try to cook your favorite foods in a healthier way. For example, try baking instead of frying.  Use low-fat dairy products. Meal planning  Use more fruits and vegetables. One-half of your plate should be fruits and vegetables.  Include lean proteins, such as chicken, turkey, and fish. Lifestyle Each week, aim to do one of the following:  150 minutes of moderate exercise, such as walking.  75 minutes of vigorous exercise, such as running. General information  Know how many calories are in the foods you eat most often. This will help you calculate calorie counts faster.  Find a way of tracking calories that works for you. Get creative. Try different apps or programs if writing down calories does not work for you. What foods should I eat?  Eat nutritious foods. It is better to have a nutritious,  high-calorie food, such as an avocado, than a food with few nutrients, such as a bag of potato chips.  Use your calories on foods and drinks that will fill you up and will not leave you hungry soon after eating. ? Examples of foods that fill you up are nuts and nut butters, vegetables, lean proteins, and high-fiber foods such as whole grains. High-fiber foods are foods with more than 5 g of fiber per serving.  Pay attention to calories in drinks. Low-calorie drinks include water and unsweetened drinks. The items listed above may not be a complete list of foods and beverages you can eat.   Contact a dietitian for more information.   What foods should I limit? Limit foods or drinks that are not good sources of vitamins, minerals, or protein or that are high in unhealthy fats. These include:  Candy.  Other sweets.  Sodas, specialty coffee drinks, alcohol, and juice. The items listed above may not be a complete list of foods and beverages you should avoid. Contact a dietitian for more information. How do I count calories when eating out?  Pay attention to portions. Often, portions are much larger when eating out. Try these tips to keep portions smaller: ? Consider sharing a meal instead of getting your own. ? If you get your own meal, eat only half of it. Before you start eating, ask for a container and put half of your meal into it. ? When available, consider ordering smaller portions from the menu instead of full portions.  Pay attention to your food and drink choices. Knowing the way food is cooked and what is included with the meal can help you eat fewer calories. ? If calories are listed on the menu, choose the lower-calorie options. ? Choose dishes that include vegetables, fruits, whole grains, low-fat dairy products, and lean proteins. ? Choose items that are boiled, broiled, grilled, or steamed. Avoid items that are buttered, battered, fried, or served with cream sauce. Items labeled as  crispy are usually fried, unless stated otherwise. ? Choose water, low-fat milk, unsweetened iced tea, or other drinks without added sugar. If you want an alcoholic beverage, choose a lower-calorie option, such as a glass of wine or light beer. ? Ask for dressings, sauces, and syrups on the side. These are usually high in calories, so you should limit the amount you eat. ? If you want a salad, choose a garden salad and ask for grilled meats. Avoid extra toppings such as bacon, cheese, or fried items. Ask for the dressing on the side, or ask for olive oil and vinegar or lemon to use as dressing.  Estimate how many servings of a food you are given. Knowing serving sizes will help you be aware of how much food you are eating at restaurants. Where to find more information  Centers for Disease Control and Prevention: www.cdc.gov  [place] Department of Agriculture: myplate.gov Summary  Calorie counting means keeping track of how many calories you eat and drink each day. If you eat fewer calories than your body needs, you should lose weight.  A healthy amount of weight to lose per week is usually 1-2 lb (0.5-0.9 kg). This usually means reducing your daily calorie intake by 500-750 calories.  The number of calories in a food can be found on a Nutrition Facts label. If a food does not have a Nutrition Facts label, try to look up the calories online or ask your dietitian for help.  Use smaller plates, glasses, and bowls for smaller portions and to prevent overeating.  Use your calories on foods and drinks that will fill you up and not leave you hungry shortly after a meal. This information is not intended to replace advice given to you by your health care provider. Make sure you discuss any questions you have with your health care provider. Document Revised: 07/06/2019 Document Reviewed: 07/06/2019 Elsevier Patient Education  2021 Elsevier Inc.  

## 2020-10-10 NOTE — Progress Notes (Signed)
 Acute Office Visit  Subjective:    Patient ID: Hannah [name], female    DOB: 10/23/1960, 59 y.o.   MRN: [id number]  Chief Complaint  Patient presents with  . Leg Pain    HPI Patient is in today for left leg pain x 2 weeks. The pain starts in her posterior knee and radiates up her leg. The pain is intermittent. The pain is sharp. The pain is a 5/10. Walking makes the pain better. The pain is worse with sitting and inactivity. Denies erythema, warmth, or fever. No injury. Denies numbness or tingling. She has been taking her oxycodone with good relief.   She also reports decreased urine output. She drinks 8-10 cups of water a day but only voids 3 times a day. Her urine is not dark. She denies dysuria or fever. She would like to have her kidney function checked today. She has some generalized edema at baseline.   She would like to try Wegovy for weight loss. She has many friends who have had a lot of success with it.   She is fasting this morning. She has a chronic follow up appointment next week. She would like to go ahead and get labs done today.   Past Medical History:  Diagnosis Date  . Acquired dilation of bile duct   . Alcohol abuse   . Anxiety   . Blood type O+   . Chronic neck and back pain   . Compulsive skin picking 1998  . Depression   . Drug abuse (HCC)   . GERD (gastroesophageal reflux disease)   . History of iron deficiency anemia   . Hyperlipidemia   . Insomnia   . Migraines   . Osteoporosis 07/08/2017   T-score -2.6 in 2019; -2.2 on 08/05/2018  . Vitamin D deficiency     Past Surgical History:  Procedure Laterality Date  . ABDOMINAL HYSTERECTOMY  09/07/2006  . CESAREAN SECTION  02/16/1990  . CHOLECYSTECTOMY  2001  . ENDOMETRIAL ABLATION W/ NOVASURE    . GASTRIC BYPASS  1998  . HEMORRHOID SURGERY  2003  . HERNIA REPAIR  2004  . KNEE ARTHROSCOPY Right 2008  . MOUTH SURGERY    . 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 Anxiety disorder Mother   . Depression Mother   . Breast cancer Mother   . Ovarian cancer Mother   . Alcohol abuse Father   . Heart disease Father   . Kidney disease Brother   . Heart disease Brother   . Breast cancer Maternal Aunt   . Breast cancer Maternal Grandmother   . Breast cancer Maternal Grandfather     Social History   Socioeconomic History  . Marital status: Single    Spouse name: Not on file  . Number of children: Not on file  . Years of education: Not on file  . Highest education level: Not on file  Occupational History  . Not on file  Tobacco Use  . Smoking status: Former Smoker    Types: Cigarettes    Quit date: 1991    Years since quitting: 31.3  . Smokeless tobacco: Never Used  Vaping Use  . Vaping Use: Never used  Substance and Sexual Activity  . Alcohol use: Not Currently  . Drug use: Not Currently  . Sexual activity: Not Currently  Other Topics Concern  . Not on file  Social History Narrative  . Not on   file   Social Determinants of Health   Financial Resource Strain: Not on file  Food Insecurity: Not on file  Transportation Needs: Not on file  Physical Activity: Not on file  Stress: Not on file  Social Connections: Not on file  Intimate Partner Violence: Not on file    Outpatient Medications Prior to Visit  Medication Sig Dispense Refill  . B Complex-C (SUPER B COMPLEX PO) Take 1 tablet by mouth daily.    . baclofen (LIORESAL) 10 MG tablet Take 1 tablet (10 mg total) by mouth 3 (three) times daily as needed. for muscle spams 90 tablet 3  . busPIRone (BUSPAR) 15 MG tablet Take 1 tablet (15 mg total) by mouth in the morning, at noon, and at bedtime. 90 tablet 2  . celecoxib (CELEBREX) 200 MG capsule Take 1 capsule (200 mg total) by mouth daily. 90 capsule 1  . Cholecalciferol (VITAMIN D3) 125 MCG (5000 UT) CAPS Take 1 capsule by mouth daily.    Marland Kitchen CINNAMON PO Take by mouth.    . diphenhydrAMINE (BENADRYL) 25 mg  capsule Take 50 mg by mouth daily as needed.    . famotidine (PEPCID) 20 MG tablet Take 20 mg by mouth daily.    . Oxycodone HCl 10 MG TABS Take 1 tablet (10 mg total) by mouth every 8 (eight) hours as needed. 90 tablet 0  . sertraline (ZOLOFT) 100 MG tablet Take 1 tablet (100 mg total) by mouth in the morning and at bedtime. 180 tablet 0  . traZODone (DESYREL) 100 MG tablet Take 2 tablets (200 mg total) by mouth at bedtime. 90 tablet 1  . ziprasidone (GEODON) 20 MG capsule Take 1 capsule (20 mg total) by mouth in the morning and at bedtime. 180 capsule 1  . furosemide (LASIX) 20 MG tablet Take 1 tablet (20 mg total) by mouth as needed for edema. 30 tablet 3   No facility-administered medications prior to visit.    Allergies  Allergen Reactions  . Demerol [Meperidine] Other (See Comments)    disorientation   . Other Other (See Comments)    Disoriented and nausea  . Latex Other (See Comments)    blisters  . Morphine Palpitations    SLOW HEART RATE     Review of Systems As per HPI.     Objective:    Physical Exam Vitals and nursing note reviewed.  Constitutional:      General: She is not in acute distress.    Appearance: Normal appearance. She is not ill-appearing, toxic-appearing or diaphoretic.  Musculoskeletal:     Left knee: No deformity, effusion, erythema, ecchymosis or bony tenderness. Tenderness (center of posterior knee) present.     Right lower leg: Edema present.     Left lower leg: Edema present.  Skin:    General: Skin is warm and dry.  Neurological:     General: No focal deficit present.     Mental Status: She is alert and oriented to person, place, and time.  Psychiatric:        Mood and Affect: Mood normal.        Behavior: Behavior normal.     BP 117/72   Pulse 71   Temp 98 F (36.7 C) (Temporal)   Ht 5' 5" (1.651 m)   Wt 282 lb 4 oz (128 kg)   BMI 46.97 kg/m  Wt Readings from Last 3 Encounters:  10/10/20 282 lb 4 oz (128 kg)  07/15/20 284 lb  2 oz (  128.9 kg)  04/12/20 275 lb 4 oz (124.9 kg)    Health Maintenance Due  Topic Date Due  . MAMMOGRAM  Never done    There are no preventive care reminders to display for this patient.   Lab Results  Component Value Date   TSH 1.70 08/11/2019   Lab Results  Component Value Date   WBC 8.7 07/15/2020   HGB 10.2 (L) 07/15/2020   HCT 33.4 (L) 07/15/2020   MCV 84 07/15/2020   PLT 216 07/15/2020   Lab Results  Component Value Date   NA 142 07/15/2020   K 4.3 07/15/2020   CO2 24 07/15/2020   GLUCOSE 86 07/15/2020   BUN 12 07/15/2020   CREATININE 1.04 (H) 07/15/2020   BILITOT 0.3 07/15/2020   ALKPHOS 85 07/15/2020   AST 20 07/15/2020   ALT 14 07/15/2020   PROT 6.5 07/15/2020   ALBUMIN 3.9 07/15/2020   CALCIUM 10.2 07/15/2020   Lab Results  Component Value Date   CHOL 154 07/15/2020   Lab Results  Component Value Date   HDL 43 07/15/2020   Lab Results  Component Value Date   LDLCALC 97 07/15/2020   Lab Results  Component Value Date   TRIG 70 07/15/2020   Lab Results  Component Value Date   CHOLHDL 3.6 07/15/2020   Lab Results  Component Value Date   HGBA1C 5.0 08/11/2019       Assessment & Plan:   Hannah [name] was seen today for leg pain.  Diagnoses and all orders for this visit:  Posterior left knee pain Xray today in office, radiology report pending. ? Baker's cyst. Rest, elevation. Patient is already on opiate therapy. Will notify patient of Xray results.  -     DG Knee 1-2 Views Left; Future  Decreased urine output Labs pending.  -     CMP14+EGFR  Morbid obesity ([place]) Wegovy ordered. Labs pending. Diet and exercise.  -     CBC with Differential/Platelet -     CMP14+EGFR -     Lipid panel -     Bayer DCA Hb A1c Waived -     Semaglutide-Weight Management 0.25 MG/0.5ML SOAJ; Inject 0.25 mg into the skin once a week.  Well adult exam For lab ordering purposes.  -     CBC with Differential/Platelet -     CMP14+EGFR -     Lipid panel -      Bayer DCA Hb A1c Waived  Return to office for new or worsening symptoms, or if symptoms persist.   The patient indicates understanding of these issues and agrees with the plan.   Gwenlyn Perking, FNP

## 2020-10-11 ENCOUNTER — Telehealth: Payer: Self-pay

## 2020-10-11 NOTE — Telephone Encounter (Signed)
Advised pt that Ozempic is like Wegovy just the lower dose that she has to start on and work her way up to. Pt voiced understanding.

## 2020-10-11 NOTE — Telephone Encounter (Signed)
Pt called stating that she got a notification that there was a Rx for Ozempic at the pharmacy for her to pick up.  Pt says she was unaware of this Rx and has questions about it.  Please call patient.

## 2020-10-15 ENCOUNTER — Other Ambulatory Visit: Payer: Self-pay

## 2020-10-15 ENCOUNTER — Encounter: Payer: Self-pay | Admitting: Family Medicine

## 2020-10-15 ENCOUNTER — Ambulatory Visit (INDEPENDENT_AMBULATORY_CARE_PROVIDER_SITE_OTHER): Payer: Medicare Other | Admitting: Family Medicine

## 2020-10-15 DIAGNOSIS — F5101 Primary insomnia: Secondary | ICD-10-CM

## 2020-10-15 DIAGNOSIS — E7800 Pure hypercholesterolemia, unspecified: Secondary | ICD-10-CM | POA: Diagnosis not present

## 2020-10-15 DIAGNOSIS — M25562 Pain in left knee: Secondary | ICD-10-CM | POA: Diagnosis not present

## 2020-10-15 DIAGNOSIS — F331 Major depressive disorder, recurrent, moderate: Secondary | ICD-10-CM

## 2020-10-15 DIAGNOSIS — M542 Cervicalgia: Secondary | ICD-10-CM

## 2020-10-15 DIAGNOSIS — M545 Low back pain, unspecified: Secondary | ICD-10-CM | POA: Diagnosis not present

## 2020-10-15 DIAGNOSIS — G8929 Other chronic pain: Secondary | ICD-10-CM

## 2020-10-15 DIAGNOSIS — F419 Anxiety disorder, unspecified: Secondary | ICD-10-CM | POA: Diagnosis not present

## 2020-10-15 DIAGNOSIS — M25462 Effusion, left knee: Secondary | ICD-10-CM

## 2020-10-15 MED ORDER — OXYCODONE HCL 10 MG PO TABS: 10.0000 mg | ORAL_TABLET | Freq: Three times a day (TID) | ORAL | 0 refills | Status: AC | PRN

## 2020-10-15 MED ORDER — OXYCODONE HCL 10 MG PO TABS: 10.0000 mg | ORAL_TABLET | Freq: Three times a day (TID) | ORAL | 0 refills | Status: DC | PRN

## 2020-10-15 MED ORDER — SERTRALINE HCL 100 MG PO TABS: 100.0000 mg | ORAL_TABLET | Freq: Two times a day (BID) | ORAL | 2 refills | Status: DC

## 2020-10-15 NOTE — Progress Notes (Signed)
Established Patient Office Visit  Subjective:  Patient ID: Hannah [name], female    DOB: Feb 26, 1961  Age: 60 y.o. MRN: [id number]  CC:  Chief Complaint  Patient presents with  . Pain    HPI Hannah [name] presents for chronic follow up. She has no new concerns today.   1. Depression/anxiety Reports doing well with Zoloft and geodon. Buspar TID for anxiety as well. Reports doing well.  Depression screen [facility] 2/9 10/15/2020 10/10/2020 07/15/2020  Decreased Interest 0 0 0  Down, Depressed, Hopeless 0 0 0  PHQ - 2 Score 0 0 0  Altered sleeping 0 - 0  Tired, decreased energy 3 - 1  Change in appetite 0 - 1  Feeling bad or failure about yourself  0 - 0  Trouble concentrating 0 - 0  Moving slowly or fidgety/restless 0 - 0  Suicidal thoughts 0 - 0  PHQ-9 Score 3 - 2  Difficult doing work/chores Somewhat difficult - Not difficult at all   GAD 7 : Generalized Anxiety Score 10/15/2020 04/12/2020 01/12/2020  Nervous, Anxious, on Edge 0 0 1  Control/stop worrying 1 1 1   Worry too much - different things 3 1 1   Trouble relaxing 0 0 0  Restless 0 0 0  Easily annoyed or irritable 0 1 1  Afraid - awful might happen 0 0 0  Total GAD 7 Score 4 3 4   Anxiety Difficulty Somewhat difficult Not difficult at all -    2. Obesity 8 lb weight gain since last visit last week. Denies changes in her diet. Mancel Parsons has been approved by insurance. She has brought this with her today for administration teaching.   3. Chronic back pain Takes oxycodone for chronic back pain. 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    Pain assessment: Cause of pain- forklift injury when she was in her early 20's Pain location- low back Pain on scale of 1-10- 4/10 currently Frequency- daily What increases pain-house work What makes pain Better-resting Effects on ADL - none Any change in general medical condition-none  Current opioids rx- oxycodone 10 TID # meds rx- 90 Effectiveness of current  meds-helps Adverse reactions from pain meds-none Morphine equivalent- 45MME  Pill count performed-No Last drug screen - 01/12/20 ( high risk q44m moderate risk q659mlow risk yearly ) Urine drug screen today- No [place] yes             If yes were their any concerning findings? - none   Overdose risk: 1 Opioid Risk  01/18/2020  Alcohol 3  Illegal Drugs 0  Rx Drugs 0  Alcohol 3  Illegal Drugs 0  Rx Drugs 0  Age between 16-45 years  0  History of Preadolescent Sexual Abuse 3  Psychological Disease 0  Depression 1  Opioid Risk Tool Scoring 10  Opioid Risk Interpretation High Risk   Pain contract signed: 01/23/20  5. Insomnia Reports trazodone is working well.   6. Left knee pain Was seen last week for acute knee pain. Radiology reports shows small joint effusion and degenerative changes.   7. Chronic neck pain Takes baclofen as needed with good relief.   Past Medical History:  Diagnosis Date  . Acquired dilation of bile duct   . Alcohol abuse   . Anxiety   . Blood type O+   . Chronic neck and back pain   . Compulsive skin picking 1998  . Depression   .  Drug abuse ([place])   . GERD (gastroesophageal reflux disease)   . History of iron deficiency anemia   . Hyperlipidemia   . Insomnia   . Migraines   . Osteoporosis 07/08/2017   T-score -2.6 in 2019; -2.2 on 08/05/2018  . Vitamin D deficiency     Past Surgical History:  Procedure Laterality Date  . ABDOMINAL HYSTERECTOMY  09/07/2006  . CESAREAN SECTION  02/16/1990  . CHOLECYSTECTOMY  2001  . ENDOMETRIAL ABLATION W/ NOVASURE    . GASTRIC BYPASS  1998  . [place]  2003  . HERNIA REPAIR  2004  . KNEE ARTHROSCOPY Right 2008  . MOUTH SURGERY    . 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 Anxiety disorder Mother   . Depression Mother   . Breast cancer Mother   . Ovarian cancer Mother   . Alcohol abuse Father   . Heart disease Father    . Kidney disease Brother   . Heart disease Brother   . Breast cancer Maternal Aunt   . Breast cancer Maternal Grandmother   . Breast cancer Maternal Grandfather     Social History   Socioeconomic History  . Marital status: Single    Spouse name: Not on file  . Number of children: Not on file  . Years of education: Not on file  . Highest education level: Not on file  Occupational History  . Not on file  Tobacco Use  . Smoking status: Former Smoker    Types: Cigarettes    Quit date: 1991    Years since quitting: 31.3  . Smokeless tobacco: Never Used  Vaping Use  . Vaping Use: Never used  Substance and Sexual Activity  . Alcohol use: Not Currently  . Drug use: Not Currently  . Sexual activity: Not Currently  Other Topics Concern  . Not on file  Social History Narrative  . Not on file   Social Determinants of Health   Financial Resource Strain: Not on file  Food Insecurity: Not on file  Transportation Needs: Not on file  Physical Activity: Not on file  Stress: Not on file  Social Connections: Not on file  Intimate Partner Violence: Not on file    Outpatient Medications Prior to Visit  Medication Sig Dispense Refill  . B Complex-C (SUPER B COMPLEX PO) Take 1 tablet by mouth daily.    . baclofen (LIORESAL) 10 MG tablet Take 1 tablet (10 mg total) by mouth 3 (three) times daily as needed. for muscle spams 90 tablet 3  . busPIRone (BUSPAR) 15 MG tablet Take 1 tablet (15 mg total) by mouth in the morning, at noon, and at bedtime. 90 tablet 2  . celecoxib (CELEBREX) 200 MG capsule Take 1 capsule (200 mg total) by mouth daily. 90 capsule 1  . Cholecalciferol (VITAMIN D3) 125 MCG (5000 UT) CAPS Take 1 capsule by mouth daily.    Marland Kitchen CINNAMON PO Take by mouth.    . diphenhydrAMINE (BENADRYL) 25 mg capsule Take 50 mg by mouth daily as needed.    . famotidine (PEPCID) 20 MG tablet Take 20 mg by mouth daily.    . Oxycodone HCl 10 MG TABS Take 1 tablet (10 mg total) by mouth  every 8 (eight) hours as needed. 90 tablet 0  . sertraline (ZOLOFT) 100 MG tablet Take 1 tablet (100 mg total) by mouth in the morning and at bedtime. 180 tablet 0  .  traZODone (DESYREL) 100 MG tablet Take 2 tablets (200 mg total) by mouth at bedtime. 90 tablet 1  . ziprasidone (GEODON) 20 MG capsule Take 1 capsule (20 mg total) by mouth in the morning and at bedtime. 180 capsule 1  . Semaglutide-Weight Management 0.25 MG/0.5ML SOAJ Inject 0.25 mg into the skin once a week. (Patient not taking: Reported on 10/15/2020) 2 mL 0   No facility-administered medications prior to visit.    Allergies  Allergen Reactions  . Demerol [Meperidine] Other (See Comments)    disorientation   . Other Other (See Comments)    Disoriented and nausea  . Latex Other (See Comments)    blisters  . Morphine Palpitations    SLOW HEART RATE     ROS Review of Systems Negative unless specially indicated above in HPI.    Objective:    Physical Exam Constitutional:      General: She is not in acute distress.    Appearance: She is well-developed. She is not ill-appearing, toxic-appearing or diaphoretic.  Neck:     Thyroid: No thyromegaly.     Vascular: No carotid bruit or JVD.     Trachea: Trachea normal.  Cardiovascular:     Rate and Rhythm: Normal rate and regular rhythm.     Heart sounds: Normal heart sounds. No murmur heard. No friction rub. No gallop.   Pulmonary:     Effort: Pulmonary effort is normal.     Breath sounds: Normal breath sounds.  Abdominal:     General: Bowel sounds are normal. There is no distension.     Palpations: Abdomen is soft. There is no mass.     Tenderness: There is no abdominal tenderness.  Musculoskeletal:        General: Normal range of motion.     Cervical back: Full passive range of motion without pain, normal range of motion and neck supple.     Right lower leg: 1+ Edema present.     Left lower leg: 1+ Edema present.  Lymphadenopathy:     Cervical: No cervical  adenopathy.  Skin:    General: Skin is warm and dry.  Neurological:     General: No focal deficit present.     Mental Status: She is alert and oriented to person, place, and time.     Deep Tendon Reflexes: Reflexes are normal and symmetric.  Psychiatric:        Behavior: Behavior normal.        Thought Content: Thought content normal.        Judgment: Judgment normal.     BP 107/68   Pulse 76   Temp 98.3 F (36.8 C) (Temporal)   Ht 5' 5"  (1.651 m)   Wt 290 lb 2 oz (131.6 kg)   BMI 48.28 kg/m  Wt Readings from Last 3 Encounters:  10/15/20 290 lb 2 oz (131.6 kg)  10/10/20 282 lb 4 oz (128 kg)  07/15/20 284 lb 2 oz (128.9 kg)     Health Maintenance Due  Topic Date Due  . COLONOSCOPY (Pts 45-60yr Insurance coverage will need to be confirmed)  Never done  . MAMMOGRAM  08/18/2020    There are no preventive care reminders to display for this patient.  Lab Results  Component Value Date   TSH 1.70 08/11/2019   Lab Results  Component Value Date   WBC 6.1 10/10/2020   HGB 10.9 (L) 10/10/2020   HCT 34.6 10/10/2020   MCV 85 10/10/2020   PLT  187 10/10/2020   Lab Results  Component Value Date   NA 141 10/10/2020   K 4.1 10/10/2020   CO2 25 10/10/2020   GLUCOSE 96 10/10/2020   BUN 10 10/10/2020   CREATININE 0.96 10/10/2020   BILITOT 0.4 10/10/2020   ALKPHOS 87 10/10/2020   AST 19 10/10/2020   ALT 16 10/10/2020   PROT 6.5 10/10/2020   ALBUMIN 3.9 10/10/2020   CALCIUM 10.2 10/10/2020   EGFR 68 10/10/2020   Lab Results  Component Value Date   CHOL 164 10/10/2020   Lab Results  Component Value Date   HDL 47 10/10/2020   Lab Results  Component Value Date   LDLCALC 104 (H) 10/10/2020   Lab Results  Component Value Date   TRIG 66 10/10/2020   Lab Results  Component Value Date   CHOLHDL 3.5 10/10/2020   Lab Results  Component Value Date   HGBA1C 4.9 10/10/2020      Assessment & Plan:   Maliea was seen today for pain.  Diagnoses and all orders for  this visit:  Morbid obesity ([place]) Stable labs last week. Will start [facility] today. Diet and exercise.   Pure hypercholesterolemia LDL 104. ASCVD risk is 2%. Diet and exercise.   Chronic bilateral low back pain without sciatica Well controlled on current regimen. PDMP reviewed, no red flags. Refills provided. UTD on UDS and CSA.  -     Oxycodone HCl 10 MG TABS; Take 1 tablet (10 mg total) by mouth every 8 (eight) hours as needed. -     Oxycodone HCl 10 MG TABS; Take 1 tablet (10 mg total) by mouth every 8 (eight) hours as needed. -     Oxycodone HCl 10 MG TABS; Take 1 tablet (10 mg total) by mouth every 8 (eight) hours as needed.  Chronic neck pain Baclofen prn.   Moderate episode of recurrent major depressive disorder (HCC) Well controlled on current regimen.  -     sertraline (ZOLOFT) 100 MG tablet; Take 1 tablet (100 mg total) by mouth in the morning and at bedtime.  Anxiety Well controlled on current regimen of zoloft and buspar.   Primary insomnia Well controlled on trazodone.   Posterior left knee pain/Fluid in left knee joint -     Ambulatory referral to Orthopedic Surgery   Follow-up: Return in about 1 month (around 11/15/2020) for weight management.   The patient indicates understanding of these issues and agrees with the plan.  Gwenlyn Perking, FNP

## 2020-10-15 NOTE — Patient Instructions (Signed)
Calorie Counting for Weight Loss Calories are units of energy. Your body needs a certain number of calories from food to keep going throughout the day. When you eat or drink more calories than your body needs, your body stores the extra calories mostly as fat. When you eat or drink fewer calories than your body needs, your body burns fat to get the energy it needs. Calorie counting means keeping track of how many calories you eat and drink each day. Calorie counting can be helpful if you need to lose weight. If you eat fewer calories than your body needs, you should lose weight. Ask your health care provider what a healthy weight is for you. For calorie counting to work, you will need to eat the right number of calories each day to lose a healthy amount of weight per week. A dietitian can help you figure out how many calories you need in a day and will suggest ways to reach your calorie goal.  A healthy amount of weight to lose each week is usually 1-2 lb (0.5-0.9 kg). This usually means that your daily calorie intake should be reduced by 500-750 calories.  Eating 1,200-1,500 calories a day can help most women lose weight.  Eating 1,500-1,800 calories a day can help most men lose weight. What do I need to know about calorie counting? Work with your health care provider or dietitian to determine how many calories you should get each day. To meet your daily calorie goal, you will need to:  Find out how many calories are in each food that you would like to eat. Try to do this before you eat.  Decide how much of the food you plan to eat.  Keep a food log. Do this by writing down what you ate and how many calories it had. To successfully lose weight, it is important to balance calorie counting with a healthy lifestyle that includes regular activity. Where do I find calorie information? The number of calories in a food can be found on a Nutrition Facts label. If a food does not have a Nutrition Facts  label, try to look up the calories online or ask your dietitian for help. Remember that calories are listed per serving. If you choose to have more than one serving of a food, you will have to multiply the calories per serving by the number of servings you plan to eat. For example, the label on a package of bread might say that a serving size is 1 slice and that there are 90 calories in a serving. If you eat 1 slice, you will have eaten 90 calories. If you eat 2 slices, you will have eaten 180 calories.   How do I keep a food log? After each time that you eat, record the following in your food log as soon as possible:  What you ate. Be sure to include toppings, sauces, and other extras on the food.  How much you ate. This can be measured in cups, ounces, or number of items.  How many calories were in each food and drink.  The total number of calories in the food you ate. Keep your food log near you, such as in a pocket-sized notebook or on an app or website on your mobile phone. Some programs will calculate calories for you and show you how many calories you have left to meet your daily goal. What are some portion-control tips?  Know how many calories are in a serving. This will   help you know how many servings you can have of a certain food.  Use a measuring cup to measure serving sizes. You could also try weighing out portions on a kitchen scale. With time, you will be able to estimate serving sizes for some foods.  Take time to put servings of different foods on your favorite plates or in your favorite bowls and cups so you know what a serving looks like.  Try not to eat straight from a food's packaging, such as from a bag or box. Eating straight from the package makes it hard to see how much you are eating and can lead to overeating. Put the amount you would like to eat in a cup or on a plate to make sure you are eating the right portion.  Use smaller plates, glasses, and bowls for smaller  portions and to prevent overeating.  Try not to multitask. For example, avoid watching TV or using your computer while eating. If it is time to eat, sit down at a table and enjoy your food. This will help you recognize when you are full. It will also help you be more mindful of what and how much you are eating. What are tips for following this plan? Reading food labels  Check the calorie count compared with the serving size. The serving size may be smaller than what you are used to eating.  Check the source of the calories. Try to choose foods that are high in protein, fiber, and vitamins, and low in saturated fat, trans fat, and sodium. Shopping  Read nutrition labels while you shop. This will help you make healthy decisions about which foods to buy.  Pay attention to nutrition labels for low-fat or fat-free foods. These foods sometimes have the same number of calories or more calories than the full-fat versions. They also often have added sugar, starch, or salt to make up for flavor that was removed with the fat.  Make a grocery list of lower-calorie foods and stick to it. Cooking  Try to cook your favorite foods in a healthier way. For example, try baking instead of frying.  Use low-fat dairy products. Meal planning  Use more fruits and vegetables. One-half of your plate should be fruits and vegetables.  Include lean proteins, such as chicken, turkey, and fish. Lifestyle Each week, aim to do one of the following:  150 minutes of moderate exercise, such as walking.  75 minutes of vigorous exercise, such as running. General information  Know how many calories are in the foods you eat most often. This will help you calculate calorie counts faster.  Find a way of tracking calories that works for you. Get creative. Try different apps or programs if writing down calories does not work for you. What foods should I eat?  Eat nutritious foods. It is better to have a nutritious,  high-calorie food, such as an avocado, than a food with few nutrients, such as a bag of potato chips.  Use your calories on foods and drinks that will fill you up and will not leave you hungry soon after eating. ? Examples of foods that fill you up are nuts and nut butters, vegetables, lean proteins, and high-fiber foods such as whole grains. High-fiber foods are foods with more than 5 g of fiber per serving.  Pay attention to calories in drinks. Low-calorie drinks include water and unsweetened drinks. The items listed above may not be a complete list of foods and beverages you can eat.   Contact a dietitian for more information.   What foods should I limit? Limit foods or drinks that are not good sources of vitamins, minerals, or protein or that are high in unhealthy fats. These include:  Candy.  Other sweets.  Sodas, specialty coffee drinks, alcohol, and juice. The items listed above may not be a complete list of foods and beverages you should avoid. Contact a dietitian for more information. How do I count calories when eating out?  Pay attention to portions. Often, portions are much larger when eating out. Try these tips to keep portions smaller: ? Consider sharing a meal instead of getting your own. ? If you get your own meal, eat only half of it. Before you start eating, ask for a container and put half of your meal into it. ? When available, consider ordering smaller portions from the menu instead of full portions.  Pay attention to your food and drink choices. Knowing the way food is cooked and what is included with the meal can help you eat fewer calories. ? If calories are listed on the menu, choose the lower-calorie options. ? Choose dishes that include vegetables, fruits, whole grains, low-fat dairy products, and lean proteins. ? Choose items that are boiled, broiled, grilled, or steamed. Avoid items that are buttered, battered, fried, or served with cream sauce. Items labeled as  crispy are usually fried, unless stated otherwise. ? Choose water, low-fat milk, unsweetened iced tea, or other drinks without added sugar. If you want an alcoholic beverage, choose a lower-calorie option, such as a glass of wine or light beer. ? Ask for dressings, sauces, and syrups on the side. These are usually high in calories, so you should limit the amount you eat. ? If you want a salad, choose a garden salad and ask for grilled meats. Avoid extra toppings such as bacon, cheese, or fried items. Ask for the dressing on the side, or ask for olive oil and vinegar or lemon to use as dressing.  Estimate how many servings of a food you are given. Knowing serving sizes will help you be aware of how much food you are eating at restaurants. Where to find more information  Centers for Disease Control and Prevention: www.cdc.gov  [place] Department of Agriculture: myplate.gov Summary  Calorie counting means keeping track of how many calories you eat and drink each day. If you eat fewer calories than your body needs, you should lose weight.  A healthy amount of weight to lose per week is usually 1-2 lb (0.5-0.9 kg). This usually means reducing your daily calorie intake by 500-750 calories.  The number of calories in a food can be found on a Nutrition Facts label. If a food does not have a Nutrition Facts label, try to look up the calories online or ask your dietitian for help.  Use smaller plates, glasses, and bowls for smaller portions and to prevent overeating.  Use your calories on foods and drinks that will fill you up and not leave you hungry shortly after a meal. This information is not intended to replace advice given to you by your health care provider. Make sure you discuss any questions you have with your health care provider. Document Revised: 07/06/2019 Document Reviewed: 07/06/2019 Elsevier Patient Education  2021 Elsevier Inc.  

## 2020-10-18 ENCOUNTER — Telehealth: Payer: Self-pay

## 2020-10-18 DIAGNOSIS — F5101 Primary insomnia: Secondary | ICD-10-CM

## 2020-10-18 NOTE — Telephone Encounter (Signed)
Please review and advise.

## 2020-10-18 NOTE — Telephone Encounter (Signed)
  Prescription Request  10/18/2020  What is the name of the medication or equipment? Trazodone  Have you contacted your pharmacy to request a refill? (if applicable) Yes  Which pharmacy would you like this sent to? [facility] [place]   Pt had last check up with Harlow Mares on 10/15/20 and forgot to tell Tiffany that she is out of her trazodone Rx. Requesting 3 mth refills to be sent to pharmacy.

## 2020-10-21 ENCOUNTER — Other Ambulatory Visit: Payer: Self-pay | Admitting: Family Medicine

## 2020-10-21 DIAGNOSIS — F5101 Primary insomnia: Secondary | ICD-10-CM

## 2020-10-21 MED ORDER — TRAZODONE HCL 100 MG PO TABS: 200.0000 mg | ORAL_TABLET | Freq: Every day | ORAL | 1 refills | Status: DC

## 2020-10-21 NOTE — Addendum Note (Signed)
Addended by: Magdalene River on: 10/21/2020 09:41 AM   Modules accepted: Orders

## 2020-10-21 NOTE — Telephone Encounter (Signed)
Sent med in 

## 2020-10-21 NOTE — Telephone Encounter (Signed)
Ok to refill 

## 2020-10-23 ENCOUNTER — Other Ambulatory Visit: Payer: Self-pay

## 2020-10-23 ENCOUNTER — Encounter: Payer: Self-pay | Admitting: Orthopaedic Surgery

## 2020-10-23 ENCOUNTER — Ambulatory Visit (INDEPENDENT_AMBULATORY_CARE_PROVIDER_SITE_OTHER): Payer: Medicare Other | Admitting: Orthopaedic Surgery

## 2020-10-23 DIAGNOSIS — M1712 Unilateral primary osteoarthritis, left knee: Secondary | ICD-10-CM | POA: Diagnosis not present

## 2020-10-23 MED ORDER — BUPIVACAINE HCL 0.25 % IJ SOLN
2.0000 mL | INTRAMUSCULAR | Status: AC | PRN
  Administered 2020-10-23: 2 mL via INTRA_ARTICULAR

## 2020-10-23 MED ORDER — LIDOCAINE HCL 1 % IJ SOLN
2.0000 mL | INTRAMUSCULAR | Status: AC | PRN
  Administered 2020-10-23: 2 mL

## 2020-10-23 NOTE — Progress Notes (Signed)
Office Visit Note   Patient: Hannah [name]. [place]           Date of Birth: 08/09/1960           MRN: [id number] Visit Date: 10/23/2020              Requested by: Gabriel Earing, FNP [address] [place],  [place] [address] PCP: Gabriel Earing, FNP   Assessment & Plan: Visit Diagnoses:  1. Morbid obesity (HCC)   2. Unilateral primary osteoarthritis, left knee     Plan: Osteoarthritis predominantly in the medial compartment left knee.  Long discussion regarding her diagnosis and treatment options.  She had a successful right total knee replacement in [place] about 3 years ago and has gained at least 100 pounds.  Her present BMI is 47.  Discussed weight loss with her should she ever consider knee replacement.  Today I will inject the lateral compartment with betamethasone, Xylocaine and Marcaine.  She had some skin lesions medially which I avoided  Follow-Up Instructions: Return if symptoms worsen or fail to improve.   Orders:  Orders Placed This Encounter  Procedures  . Large Joint Inj: L knee   No orders of the defined types were placed in this encounter.     Procedures: Large Joint Inj: L knee on 10/23/2020 2:07 PM Indications: pain and diagnostic evaluation Details: 25 G 1.5 in needle, anterolateral approach  Arthrogram: No  Medications: 2 mL lidocaine 1 %; 2 mL bupivacaine 0.25 %  12 mg betamethasone injected with Xylocaine and lidocaine in the lateral compartment left knee Procedure, treatment alternatives, risks and benefits explained, specific risks discussed. Consent was given by the patient. Patient was prepped and draped in the usual sterile fashion.       Clinical Data: No additional findings.   Subjective: Chief Complaint  Patient presents with  . Left Knee - Pain  Patient presents today for left knee pain. She said that it has been hurting for almost a month. No known injury. She said that it is swollen. No grinding or catching. She states that it  does give way at times. She takes oxycodone as prescribed by her PCP for her lower back pain. She has a history of right total knee arthroplasty by a doctor in [place] in 2019.  Has gained about 100 pounds since her knee surgery  HPI  Review of Systems   Objective: Vital Signs: Ht 5\' 5"  (1.651 m)   Wt 280 lb (127 kg)   BMI 46.59 kg/m   Physical Exam Constitutional:      Appearance: She is well-developed.  Eyes:     Pupils: Pupils are equal, round, and reactive to light.  Pulmonary:     Effort: Pulmonary effort is normal.  Skin:    General: Skin is warm and dry.  Neurological:     Mental Status: She is alert and oriented to person, place, and time.  Psychiatric:        Behavior: Behavior normal.     Ortho Exam large knees.  Mostly medial joint pain left knee with full extension.  Difficult to determine if there is an effusion based on the size of her knee.  Full extension flex about 95 degrees without instability.  No popliteal pain or mass.  Did have some excoriated skin medially with a small rash.  Anus range of motion both hips.  Straight leg raise negative  Specialty Comments:  No specialty comments available.  Imaging:  No results found.   PMFS History: Patient Active Problem List   Diagnosis Date Noted  . Unilateral primary osteoarthritis, left knee 10/23/2020  . Primary insomnia 07/15/2020  . Controlled substance agreement signed 07/15/2020  . Depression, recurrent (HCC) 07/15/2020  . Generalized edema 07/15/2020  . Morbid obesity (HCC) 01/15/2020  . Chronic bilateral low back pain without sciatica 01/15/2020  . Anxiety   . Moderate episode of recurrent major depressive disorder (HCC) 08/04/2017   Past Medical History:  Diagnosis Date  . Acquired dilation of bile duct   . Alcohol abuse   . Anxiety   . Blood type O+   . Chronic neck and back pain   . Compulsive skin picking 1998  . Depression   . Drug abuse (HCC)   . GERD (gastroesophageal reflux  disease)   . History of iron deficiency anemia   . Hyperlipidemia   . Insomnia   . Migraines   . Osteoporosis 07/08/2017   T-score -2.6 in 2019; -2.2 on 08/05/2018  . Vitamin D deficiency     Family History  Problem Relation Age of Onset  . Anxiety disorder Mother   . Depression Mother   . Breast cancer Mother   . Ovarian cancer Mother   . Alcohol abuse Father   . Heart disease Father   . Kidney disease Brother   . Heart disease Brother   . Breast cancer Maternal Aunt   . Breast cancer Maternal Grandmother   . Breast cancer Maternal Grandfather     Past Surgical History:  Procedure Laterality Date  . ABDOMINAL HYSTERECTOMY  09/07/2006  . CESAREAN SECTION  02/16/1990  . CHOLECYSTECTOMY  2001  . ENDOMETRIAL ABLATION W/ NOVASURE    . GASTRIC BYPASS  1998  . HEMORRHOID SURGERY  2003  . HERNIA REPAIR  2004  . KNEE ARTHROSCOPY Right 2008  . MOUTH SURGERY    . 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 Not on file  Tobacco Use  . Smoking status: Former Smoker    Types: Cigarettes    Quit date: 1991    Years since quitting: 31.3  . Smokeless tobacco: Never Used  Vaping Use  . Vaping Use: Never used  Substance and Sexual Activity  . Alcohol use: Not Currently  . Drug use: Not Currently  . Sexual activity: Not Currently

## 2020-11-05 ENCOUNTER — Ambulatory Visit (INDEPENDENT_AMBULATORY_CARE_PROVIDER_SITE_OTHER): Payer: Medicare Other | Admitting: *Deleted

## 2020-11-05 ENCOUNTER — Encounter: Payer: Self-pay | Admitting: *Deleted

## 2020-11-05 DIAGNOSIS — Z0000 Encounter for general adult medical examination without abnormal findings: Secondary | ICD-10-CM

## 2020-11-05 NOTE — Progress Notes (Signed)
MEDICARE ANNUAL WELLNESS VISIT  11/05/2020  Telephone Visit Disclaimer This Medicare AWV was conducted by telephone due to national recommendations for restrictions regarding the COVID-19 Pandemic (e.g. social distancing).  I verified, using two identifiers, that I am speaking with Hannah [name] or their authorized healthcare agent. I discussed the limitations, risks, security, and privacy concerns of performing an evaluation and management service by telephone and the potential availability of an in-person appointment in the future. The patient expressed understanding and agreed to proceed.  Location of Patient: Home Location of Provider (nurse):  [facility]  Subjective:    Hannah [name] is a 60 y.o. female patient of Gabriel Earing, FNP who had a Medicare Annual Wellness Visit today via telephone. Hannah [name] is disabled and lives alone. She has one daughter. She reports that she is socially active and does interact with friends regularly. She is minimally physically active and enjoys reading.  Patient Care Team: Gabriel Earing, FNP as PCP - General (Family Medicine)  Advanced Directives 11/05/2020  Does Patient Have a Medical Advance Directive? No  Would patient like information on creating a medical advance directive? No - Guardian declined    Hospital Utilization Over the Past 12 Months: # of hospitalizations or ER visits: 0 # of surgeries: 0  Review of Systems    Patient reports that her overall health is better compared to last year.  History obtained from the patient and patient chart.   Patient Reported Readings (BP, Pulse, CBG, Weight, etc) none  Pain Assessment Pain : 0-10 Pain Score: 4  Pain Type: Chronic pain Pain Location: Back Pain Orientation: Lower Pain Descriptors / Indicators: Dull Pain Onset: More than a month ago Pain Frequency: Intermittent Pain Relieving Factors: PAIN MEDICATION Effect of Pain on Daily Activities: NONE  Pain Relieving Factors:  PAIN MEDICATION  Current Medications & Allergies (verified) Allergies as of 11/05/2020      Reactions   Demerol [meperidine] Other (See Comments)   disorientation   Other Other (See Comments)   Disoriented and nausea   Latex Other (See Comments)   blisters   Morphine Palpitations   SLOW HEART RATE      Medication List       Accurate as of Nov 05, 2020  4:30 PM. If you have any questions, ask your nurse or doctor.        baclofen 10 MG tablet Commonly known as: LIORESAL Take 1 tablet (10 mg total) by mouth 3 (three) times daily as needed. for muscle spams   busPIRone 15 MG tablet Commonly known as: BUSPAR Take 1 tablet (15 mg total) by mouth in the morning, at noon, and at bedtime.   celecoxib 200 MG capsule Commonly known as: CELEBREX Take 1 capsule (200 mg total) by mouth daily.   CINNAMON PO Take by mouth.   diphenhydrAMINE 25 mg capsule Commonly known as: BENADRYL Take 50 mg by mouth daily as needed.   famotidine 20 MG tablet Commonly known as: PEPCID Take 20 mg by mouth daily.   Oxycodone HCl 10 MG Tabs Take 1 tablet (10 mg total) by mouth every 8 (eight) hours as needed.   Oxycodone HCl 10 MG Tabs Take 1 tablet (10 mg total) by mouth every 8 (eight) hours as needed. Start taking on: November 15, 2020   Oxycodone HCl 10 MG Tabs Take 1 tablet (10 mg total) by mouth every 8 (eight) hours as needed. Start taking on: December 16, 2020   Semaglutide-Weight Management  0.25 MG/0.5ML Soaj Inject 0.25 mg into the skin once a week.   sertraline 100 MG tablet Commonly known as: ZOLOFT Take 1 tablet (100 mg total) by mouth in the morning and at bedtime.   SUPER B COMPLEX PO Take 1 tablet by mouth daily.   traZODone 100 MG tablet Commonly known as: DESYREL Take 2 tablets (200 mg total) by mouth at bedtime.   Vitamin D3 125 MCG (5000 UT) Caps Take 1 capsule by mouth daily.   ziprasidone 20 MG capsule Commonly known as: GEODON Take 1 capsule (20 mg total) by  mouth in the morning and at bedtime.       History (reviewed): Past Medical History:  Diagnosis Date  . Acquired dilation of bile duct   . Alcohol abuse   . Anxiety   . Blood type O+   . Chronic neck and back pain   . Compulsive skin picking 1998  . Depression   . Drug abuse (HCC)   . GERD (gastroesophageal reflux disease)   . History of iron deficiency anemia   . Hyperlipidemia   . Insomnia   . Migraines   . Osteoporosis 07/08/2017   T-score -2.6 in 2019; -2.2 on 08/05/2018  . Vitamin D deficiency    Past Surgical History:  Procedure Laterality Date  . ABDOMINAL HYSTERECTOMY  09/07/2006  . CESAREAN SECTION  02/16/1990  . CHOLECYSTECTOMY  2001  . ENDOMETRIAL ABLATION W/ NOVASURE    . GASTRIC BYPASS  1998  . HEMORRHOID SURGERY  2003  . HERNIA REPAIR  2004  . KNEE ARTHROSCOPY Right 2008  . MOUTH SURGERY    . 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 Anxiety disorder Mother   . Depression Mother   . Breast cancer Mother   . Ovarian cancer Mother   . Alcohol abuse Father   . Heart disease Father   . Kidney disease Brother   . Heart disease Brother   . Breast cancer Maternal Aunt   . Breast cancer Maternal Grandmother   . Breast cancer Maternal Grandfather    Social History   Socioeconomic History  . Marital status: Single    Spouse name: Not on file  . Number of children: 1  . Years of education: Not on file  . Highest education level: Some college, no degree  Occupational History  . Occupation: disabled  Tobacco Use  . Smoking status: Former Smoker    Types: Cigarettes    Quit date: 1991    Years since quitting: 31.4  . Smokeless tobacco: Never Used  Vaping Use  . Vaping Use: Never used  Substance and Sexual Activity  . Alcohol use: Not Currently  . Drug use: Not Currently  . Sexual activity: Not Currently  Other Topics Concern  . Not on file  Social History Narrative   Lives alone, one daughter  that lives in [place]. Loves to read and go on walks.    Social Determinants of Health   Financial Resource Strain: Not on file  Food Insecurity: Not on file  Transportation Needs: Not on file  Physical Activity: Not on file  Stress: Not on file  Social Connections: Not on file    Activities of Daily Living In your present state of health, do you have any difficulty performing the following activities: 11/05/2020  Hearing? N  Vision? N  Difficulty concentrating or making decisions? N  Walking or climbing stairs? N  Dressing or  bathing? N  Doing errands, shopping? N  Preparing Food and eating ? N  Using the Toilet? N  In the past six months, have you accidently leaked urine? N  Do you have problems with loss of bowel control? N  Managing your Medications? N  Managing your Finances? N  Housekeeping or managing your Housekeeping? N    Patient Education/ Literacy How often do you need to have someone help you when you read instructions, pamphlets, or other written materials from your doctor or pharmacy?: 1 - Never What is the last grade level you completed in school?: 1 year college  Exercise Current Exercise Habits: Home exercise routine, Type of exercise: treadmill, Time (Minutes): 45, Frequency (Times/Week): 3, Weekly Exercise (Minutes/Week): 135, Intensity: Mild, Exercise limited by: None identified  Diet Patient reports consuming 3 meals a day and 1 snack(s) a day Patient reports that her primary diet is: Regular Patient reports that she does have regular access to food.   Depression Screen PHQ 2/9 Scores 11/05/2020 10/15/2020 10/10/2020 07/15/2020 04/12/2020 03/26/2020 01/12/2020  PHQ - 2 Score 0 0 0 0 3 1 1   PHQ- 9 Score - 3 - 2 6 - 6     Fall Risk Fall Risk  11/05/2020 10/15/2020 10/10/2020 07/15/2020 04/12/2020  Falls in the past year? 1 1 1  0 0  Number falls in past yr: 0 0 1 - -  Injury with Fall? 1 1 0 - -  Risk for fall due to : History of fall(s) History of fall(s) History  of fall(s) - -  Follow up Falls evaluation completed Falls evaluation completed Falls evaluation completed - Falls evaluation completed     Objective:  13/10/2019. Hannah [name] seemed alert and oriented and she participated appropriately during our telephone visit.  Blood Pressure Weight BMI  BP Readings from Last 3 Encounters:  10/15/20 107/68  10/10/20 117/72  07/15/20 121/77   Wt Readings from Last 3 Encounters:  10/23/20 280 lb (127 kg)  10/15/20 290 lb 2 oz (131.6 kg)  10/10/20 282 lb 4 oz (128 kg)   BMI Readings from Last 1 Encounters:  10/23/20 46.59 kg/m    *Unable to obtain current vital signs, weight, and BMI due to telephone visit type  Hearing/Vision  . Hannah [name] did not seem to have difficulty with hearing/understanding during the telephone conversation . Reports that she has had a formal eye exam by an eye care professional within the past year . Reports that she has not had a formal hearing evaluation within the past year *Unable to fully assess hearing and vision during telephone visit type  Cognitive Function: 6CIT Screen 11/05/2020  What Year? 0 points  What month? 0 points  What time? 0 points  Count back from 20 0 points  Months in reverse 0 points  Repeat phrase 0 points  Total Score 0   (Normal:0-7, Significant for Dysfunction: >8)  Normal Cognitive Function Screening: Yes   Immunization & Health Maintenance Record Immunization History  Administered Date(s) Administered  . Influenza,inj,Quad PF,6+ Mos 03/26/2020  . PFIZER(Purple Top)SARS-COV-2 Vaccination 09/15/2019, 10/06/2019, 05/05/2020  . Pneumococcal Polysaccharide-23 08/04/2018    Health Maintenance  Topic Date Due  . COLONOSCOPY (Pts 45-67yrs Insurance coverage will need to be confirmed)  Never done  . Zoster Vaccines- Shingrix (1 of 2) Never done  . MAMMOGRAM  08/18/2020  . TETANUS/TDAP  04/12/2021 (Originally 02/16/1980)  . INFLUENZA VACCINE  01/06/2021  . COVID-19 Vaccine  Completed  .  Hepatitis C Screening  Completed  .  HIV Screening  Completed  . HPV VACCINES  Aged Out       Assessment  This is a routine wellness examination for Hannah [name].  Health Maintenance: Due or Overdue Health Maintenance Due  Topic Date Due  . COLONOSCOPY (Pts 45-6yrs Insurance coverage will need to be confirmed)  Never done  . Zoster Vaccines- Shingrix (1 of 2) Never done  . MAMMOGRAM  08/18/2020    Hannah [name] does not need a referral for Community Assistance: Care Management:   no Social Work:    no Prescription Assistance:  no Nutrition/Diabetes Education:  no   Plan:  Personalized Goals Goals Addressed            This Visit's Progress   . Patient Stated       11/05/2020 AWV Goal: Fall Prevention  . Over the next year, patient will decrease their risk for falls by: o Using assistive devices, such as a cane or walker, as needed o Identifying fall risks within their home and correcting them by: - Removing throw rugs - Adding handrails to stairs or ramps - Removing clutter and keeping a clear pathway throughout the home - Increasing light, especially at night - Adding shower handles/bars - Raising toilet seat o Identifying potential personal risk factors for falls: - Medication side effects - Incontinence/urgency - Vestibular dysfunction - Hearing loss - Musculoskeletal disorders - Neurological disorders - Orthostatic hypotension        Personalized Health Maintenance & Screening Recommendations  Screening mammography  Lung Cancer Screening Recommended: no (Low Dose CT Chest recommended if Age 43-80 years, 30 pack-year currently smoking OR have quit w/in past 15 years) Hepatitis C Screening recommended: no HIV Screening recommended: no  Advanced Directives: Written information was not prepared per patient's request.  Referrals & Orders No orders of the defined types were placed in this encounter.   Follow-up Plan . Follow-up with Gabriel Earing, FNP as planned . Schedule mammogram   I have personally reviewed and noted the following in the patient's chart:   . Medical and social history . Use of alcohol, tobacco or illicit drugs  . Current medications and supplements . Functional ability and status . Nutritional status . Physical activity . Advanced directives . List of other physicians . Hospitalizations, surgeries, and ER visits in previous 12 months . Vitals . Screenings to include cognitive, depression, and falls . Referrals and appointments  In addition, I have reviewed and discussed with Hannah [place]. [place] certain preventive protocols, quality metrics, and best practice recommendations. A written personalized care plan for preventive services as well as general preventive health recommendations is available and can be mailed to the patient at her request.      Diamantina Monks, LPN 01/31/4157

## 2020-11-06 ENCOUNTER — Ambulatory Visit (INDEPENDENT_AMBULATORY_CARE_PROVIDER_SITE_OTHER): Payer: Medicare Other | Admitting: Nurse Practitioner

## 2020-11-06 ENCOUNTER — Encounter: Payer: Self-pay | Admitting: Nurse Practitioner

## 2020-11-06 DIAGNOSIS — K5900 Constipation, unspecified: Secondary | ICD-10-CM | POA: Diagnosis not present

## 2020-11-06 MED ORDER — POLYETHYLENE GLYCOL 3350 17 GM/SCOOP PO POWD: 17.0000 g | Freq: Every day | ORAL | 1 refills | Status: DC

## 2020-11-06 NOTE — Progress Notes (Signed)
   Virtual Visit  Note Due to COVID-19 pandemic this visit was conducted virtually. This visit type was conducted due to national recommendations for restrictions regarding the COVID-19 Pandemic (e.g. social distancing, sheltering in place) in an effort to limit this patient's exposure and mitigate transmission in our community. All issues noted in this document were discussed and addressed.  A physical exam was not performed with this format.  I connected with Hannah [name] on 11/06/20 at 1:30 am  by telephone and verified that I am speaking with the correct person using two identifiers. Hannah [name] is currently located at home during visit. The provider, Daryll Drown, NP is located in their office at time of visit.  I discussed the limitations, risks, security and privacy concerns of performing an evaluation and management service by telephone and the availability of in person appointments. I also discussed with the patient that there may be a patient responsible charge related to this service. The patient expressed understanding and agreed to proceed.   History and Present Illness:  Constipation This is a new problem. Episode onset: The past 4 days. The problem has been gradually improving since onset. The stool is described as firm. The patient is on a high fiber diet. There has been adequate water intake. Pertinent negatives include no abdominal pain, bloating, diarrhea, fecal incontinence, fever or hematochezia. She has tried diet changes for the symptoms. The treatment provided no relief.      Review of Systems  Constitutional: Negative for chills, fever and malaise/fatigue.  HENT: Negative.   Gastrointestinal: Positive for constipation. Negative for abdominal pain, bloating, diarrhea and hematochezia.  Skin: Negative for rash.  All other systems reviewed and are negative.    Observations/Objective:  Televisit patient did not sound to be in distress. Assessment and  Plan: Started patient on MiraLAX 17 g diluted in water or juice daily, increase to 2 times a day if no improvement.  Advised patient to increase hydration, increase fiber and vegetable in diet and exercise.  Follow-up with worsening unresolved symptoms.  Follow Up Instructions: Follow-up with worsening unresolved symptoms.    I discussed the assessment and treatment plan with the patient. The patient was provided an opportunity to ask questions and all were answered. The patient agreed with the plan and demonstrated an understanding of the instructions.   The patient was advised to call back or seek an in-person evaluation if the symptoms worsen or if the condition fails to improve as anticipated.  The above assessment and management plan was discussed with the patient. The patient verbalized understanding of and has agreed to the management plan. Patient is aware to call the clinic if symptoms persist or worsen. Patient is aware when to return to the clinic for a follow-up visit. Patient educated on when it is appropriate to go to the emergency department.   Time call ended: 1:39 PM  I provided 9 minutes of  non face-to-face time during this encounter.    Daryll Drown, NP

## 2020-11-06 NOTE — Assessment & Plan Note (Signed)
Started patient on MiraLAX 17 g diluted in water or juice daily, increase to 2 times a day if no improvement.  Advised patient to increase hydration, increase fiber and vegetable in diet and exercise.  Follow-up with worsening unresolved symptoms.

## 2020-11-06 NOTE — Patient Instructions (Signed)
Constipation, Adult Constipation is when a person has trouble pooping (having a bowel movement). When you have this condition, you may poop fewer than 3 times a week. Your poop (stool) may also be dry, hard, or bigger than normal. Follow these instructions at home: Eating and drinking  Eat foods that have a lot of fiber, such as: ? Fresh fruits and vegetables. ? Whole grains. ? Beans.  Eat less of foods that are low in fiber and high in fat and sugar, such as: ? French fries. ? Hamburgers. ? Cookies. ? Candy. ? Soda.  Drink enough fluid to keep your pee (urine) pale yellow.   General instructions  Exercise regularly or as told by your doctor. Try to do 150 minutes of exercise each week.  Go to the restroom when you feel like you need to poop. Do not hold it in.  Take over-the-counter and prescription medicines only as told by your doctor. These include any fiber supplements.  When you poop: ? Do deep breathing while relaxing your lower belly (abdomen). ? Relax your pelvic floor. The pelvic floor is a group of muscles that support the rectum, bladder, and intestines (as well as the uterus in women).  Watch your condition for any changes. Tell your doctor if you notice any.  Keep all follow-up visits as told by your doctor. This is important. Contact a doctor if:  You have pain that gets worse.  You have a fever.  You have not pooped for 4 days.  You vomit.  You are not hungry.  You lose weight.  You are bleeding from the opening of the butt (anus).  You have thin, pencil-like poop. Get help right away if:  You have a fever, and your symptoms suddenly get worse.  You leak poop or have blood in your poop.  Your belly feels hard or bigger than normal (bloated).  You have very bad belly pain.  You feel dizzy or you faint. Summary  Constipation is when a person poops fewer than 3 times a week, has trouble pooping, or has poop that is dry, hard, or bigger than  normal.  Eat foods that have a lot of fiber.  Drink enough fluid to keep your pee (urine) pale yellow.  Take over-the-counter and prescription medicines only as told by your doctor. These include any fiber supplements. This information is not intended to replace advice given to you by your health care provider. Make sure you discuss any questions you have with your health care provider. Document Revised: 04/12/2019 Document Reviewed: 04/12/2019 Elsevier Patient Education  2021 [employer]  

## 2020-11-15 ENCOUNTER — Other Ambulatory Visit: Payer: Self-pay | Admitting: Family Medicine

## 2020-11-15 DIAGNOSIS — G8929 Other chronic pain: Secondary | ICD-10-CM

## 2020-11-18 ENCOUNTER — Other Ambulatory Visit: Payer: Self-pay

## 2020-11-18 ENCOUNTER — Encounter: Payer: Self-pay | Admitting: Family Medicine

## 2020-11-18 ENCOUNTER — Ambulatory Visit (INDEPENDENT_AMBULATORY_CARE_PROVIDER_SITE_OTHER): Payer: Medicare Other | Admitting: Family Medicine

## 2020-11-18 MED ORDER — SEMAGLUTIDE-WEIGHT MANAGEMENT 0.5 MG/0.5ML ~~LOC~~ SOAJ: 0.5000 mg | SUBCUTANEOUS | 0 refills | Status: DC

## 2020-11-18 MED ORDER — SEMAGLUTIDE-WEIGHT MANAGEMENT 1 MG/0.5ML ~~LOC~~ SOAJ
1.0000 mg | SUBCUTANEOUS | 0 refills | Status: DC
Start: 2020-12-18 — End: 2020-11-19

## 2020-11-18 NOTE — Patient Instructions (Signed)
Semaglutide Injection (Weight Management) What is this medication? SEMAGLUTIDE (Sem a GLOO tide) is used to help people lose weight and maintainweight loss. It is used with a reduced-calorie diet and exercise. This medicine may be used for other purposes; ask your health care provider orpharmacist if you have questions. COMMON BRAND NAME(S): Wegovy What should I tell my care team before I take this medication? They need to know if you have any of these conditions: endocrine tumors (MEN 2) or if someone in your family had these tumors eye disease, vision problems gallbladder disease history of depression or mental health disease history of pancreatitis kidney disease stomach or intestine problems suicidal thoughts, plans, or attempt; a previous suicide attempt by you or a family member thyroid cancer or if someone in your family had thyroid cancer an unusual or allergic reaction to semaglutide, other medicines, foods, dyes, or preservatives pregnant or trying to get pregnant breast-feeding How should I use this medication? This medicine is injected under the skin. You will be taught how to prepare and give it. Take it as directed on the prescription label. It is given once every week (every 7 days). Keep taking it unless your health care provider tells youto stop. It is important that you put your used needles and pens in a special sharps container. Do not put them in a trash can. If you do not have a sharpscontainer, call your pharmacist or health care provider to get one. A special MedGuide will be given to you by the pharmacist with eachprescription and refill. Be sure to read this information carefully each time. This medicine comes with INSTRUCTIONS FOR USE. Ask your pharmacist for directions on how to use this medicine. Read the information carefully. Talk toyour pharmacist or health care provider if you have questions. Talk to your health care provider about the use of this medicine in  children.Special care may be needed. Overdosage: If you think you have taken too much of this medicine contact apoison control center or emergency room at once. NOTE: This medicine is only for you. Do not share this medicine with others. What if I miss a dose? If you miss a dose and the next scheduled dose is more than 2 days away, take the missed dose as soon as possible. If you miss a dose and the next scheduled dose is less than 2 days away, do not take the missed dose. Take the next dose at your regular time. Do not take double or extra doses. If you miss your dose for 2 weeks or more, take the next dose at your regular time or call yourhealth care provider to talk about how to restart this medicine. What may interact with this medication? insulin and other medicines for diabetes This list may not describe all possible interactions. Give your health care provider a list of all the medicines, herbs, non-prescription drugs, or dietary supplements you use. Also tell them if you smoke, drink alcohol, or use illegaldrugs. Some items may interact with your medicine. What should I watch for while using this medication? Visit your health care provider for regular checks on your progress. It may besome time before you see the benefit from this medicine. Drink plenty of fluids while taking this medicine. Check with your health care provider if you have severe diarrhea, nausea, and vomiting, or if you sweat a lot. The loss of too much body fluid may make it dangerous for you to take thismedicine. This medicine may affect blood sugar levels. Ask   your health care provider ifchanges in diet or medicines are needed if you have diabetes. If you or your family notice any changes in your behavior, such as new or worsening depression, thoughts of harming yourself, anxiety, other unusual ordisturbing thoughts, or memory loss, call your health care provider right away. Women should inform their health care provider if  they wish to become pregnant or think they might be pregnant. Losing weight while pregnant is not advised and may cause harm to the unborn child. Talk to your health care provider formore information. What side effects may I notice from receiving this medication? Side effects that you should report to your doctor or health care professionalas soon as possible: allergic reactions like skin rash, itching or hives, swelling of the face, lips, or tongue changes in vision fast heartbeat gallbladder problems (fever, upper belly pain, yellowing of the eyes or skin, clay-colored stool) kidney injury (trouble passing urine or change in the amount of urine) low blood sugar (feeling anxious; confusion; dizziness; increased hunger; unusually weak or tired; increased sweating; shakiness; cold, clammy skin; irritable; headache; blurred vision; loss of consciousness) lump or swelling on the neck pancreatitis (stomach pain that spreads to your back or gets worse after eating or when touched, fever, nausea, vomiting) suicidal thoughts, mood changes trouble breathing trouble swallowing Side effects that usually do not require medical attention (report these toyour doctor or health care professional if they continue or are bothersome): constipation diarrhea headache heartburn (burning feeling in chest, often after eating or when lying down) nausea pain, redness, or irritation at site where injected passing gas upset stomach vomiting This list may not describe all possible side effects. Call your doctor for medical advice about side effects. You may report side effects to FDA at1-800-FDA-1088. Where should I keep my medication? Keep out of the reach of children and pets. Refrigeration (preferred): Store in the refrigerator. Do not freeze. Keep this medicine in the original container until you are ready to take it. Get rid ofany unused medicine after the expiration date. Room temperature: If needed, prior to  cap removal, the pen can be stored at room temperature for up to 28 days. Protect from light. If it is stored at room temperature, get rid of any unused medicine after 28 days or after it expires,whichever is first. It is important to get rid of the medicine as soon as you no longer need it orit is expired. You can do this in two ways: Take the medicine to a medicine take-back program. Check with your pharmacy or law enforcement to find a location. If you cannot return the medicine, follow the directions in the MedGuide. NOTE: This sheet is a summary. It may not cover all possible information. If you have questions about this medicine, talk to your doctor, pharmacist, orhealth care provider.  2022 Elsevier/Gold Standard (2019-11-14 11:38:13)  

## 2020-11-18 NOTE — Progress Notes (Signed)
Established Patient Office Visit  Subjective:  Patient ID: Hannah [name], female    DOB: November 14, 1960  Age: 60 y.o. MRN: [id number]  CC:  Chief Complaint  Patient presents with   Obesity    HPI Hannah [name] presents for weight management. [name] started on Wegovy. She has lost 20 pounds. She has been walking 1-1.5 miles on her treadmill. She feels great. Denies side effects from medication.   Past Medical History:  Diagnosis Date   Acquired dilation of bile duct    Alcohol abuse    Anxiety    Blood type O+    Chronic neck and back pain    Compulsive skin picking 1998   Depression    Drug abuse ([place])    GERD (gastroesophageal reflux disease)    History of iron deficiency anemia    Hyperlipidemia    Insomnia    Migraines    Osteoporosis 07/08/2017   T-score -2.6 in 2019; -2.2 on 08/05/2018   Vitamin D deficiency     Past Surgical History:  Procedure Laterality Date   ABDOMINAL HYSTERECTOMY  09/07/2006   CESAREAN SECTION  02/16/1990   CHOLECYSTECTOMY  2001   ENDOMETRIAL ABLATION W/ [place]     GASTRIC BYPASS  1998   HEMORRHOID SURGERY  2003   HERNIA REPAIR  2004   KNEE ARTHROSCOPY Right 2008   MOUTH SURGERY     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    Family History  Problem Relation Age of Onset   Anxiety disorder Mother    Depression Mother    Breast cancer Mother    Ovarian cancer Mother    Alcohol abuse Father    Heart disease Father    Kidney disease Brother    Heart disease Brother    Breast cancer Maternal Aunt    Breast cancer Maternal Grandmother    Breast cancer Maternal Grandfather     Social History   Socioeconomic History   Marital status: Single    Spouse name: Not on file   Number of children: 1   Years of education: Not on file   Highest education level: Some college, no degree  Occupational History   Occupation: disabled  Tobacco Use   Smoking status: Former    Pack years: 0.00    Types: Cigarettes    Quit date: 1991     Years since quitting: 31.4   Smokeless tobacco: Never  Vaping Use   Vaping Use: Never used  Substance and Sexual Activity   Alcohol use: Not Currently   Drug use: Not Currently   Sexual activity: Not Currently  Other Topics Concern   Not on file  Social History Narrative   Lives alone, one daughter that lives in [place]. Loves to read and go on walks.    Social Determinants of Health   Financial Resource Strain: Not on file  Food Insecurity: Not on file  Transportation Needs: Not on file  Physical Activity: Not on file  Stress: Not on file  Social Connections: Not on file  Intimate Partner Violence: Not on file    Outpatient Medications Prior to Visit  Medication Sig Dispense Refill   B Complex-C (SUPER B COMPLEX PO) Take 1 tablet by mouth daily.     baclofen (LIORESAL) 10 MG tablet Take 1 tablet by mouth three times daily as needed for muscle spasm 90 tablet 0   busPIRone (BUSPAR) 15 MG tablet Take 1 tablet (15 mg total) by mouth in the morning, at  noon, and at bedtime. 90 tablet 2   celecoxib (CELEBREX) 200 MG capsule Take 1 capsule (200 mg total) by mouth daily. 90 capsule 1   Cholecalciferol (VITAMIN D3) 125 MCG (5000 UT) CAPS Take 1 capsule by mouth daily.     CINNAMON PO Take by mouth.     diphenhydrAMINE (BENADRYL) 25 mg capsule Take 50 mg by mouth daily as needed.     famotidine (PEPCID) 20 MG tablet Take 20 mg by mouth daily.     Oxycodone HCl 10 MG TABS Take 1 tablet (10 mg total) by mouth every 8 (eight) hours as needed. 90 tablet 0   [START ON 12/16/2020] Oxycodone HCl 10 MG TABS Take 1 tablet (10 mg total) by mouth every 8 (eight) hours as needed. 90 tablet 0   polyethylene glycol powder (GLYCOLAX/MIRALAX) 17 GM/SCOOP powder Take 17 g by mouth daily. 3350 g 1   sertraline (ZOLOFT) 100 MG tablet Take 1 tablet (100 mg total) by mouth in the morning and at bedtime. 180 tablet 2   traZODone (DESYREL) 100 MG tablet Take 2 tablets (200 mg total) by mouth at bedtime. 90 tablet  1   ziprasidone (GEODON) 20 MG capsule Take 1 capsule (20 mg total) by mouth in the morning and at bedtime. 180 capsule 1   Semaglutide-Weight Management 0.25 MG/0.5ML SOAJ Inject 0.25 mg into the skin once a week. 2 mL 0   No facility-administered medications prior to visit.    Allergies  Allergen Reactions   Demerol [Meperidine] Other (See Comments)    disorientation    Other Other (See Comments)    Disoriented and nausea   Latex Other (See Comments)    blisters   Morphine Palpitations    SLOW HEART RATE     ROS Review of Systems As per HPI.    Objective:    Physical Exam Vitals and nursing note reviewed.  Constitutional:      General: She is not in acute distress.    Appearance: She is not ill-appearing, toxic-appearing or diaphoretic.  Cardiovascular:     Rate and Rhythm: Normal rate and regular rhythm.     Heart sounds: Normal heart sounds. No murmur heard. Pulmonary:     Effort: Pulmonary effort is normal. No respiratory distress.     Breath sounds: Normal breath sounds.  Musculoskeletal:     Right lower leg: No edema.     Left lower leg: No edema.  Skin:    General: Skin is warm.  Neurological:     General: No focal deficit present.     Mental Status: She is alert and oriented to person, place, and time.  Psychiatric:        Mood and Affect: Mood normal.        Behavior: Behavior normal.    BP 112/66   Pulse 72   Temp (!) 97.1 F (36.2 C) (Oral)   Ht 5' 5"  (1.651 m)   Wt 271 lb 4 oz (123 kg)   BMI 45.14 kg/m  Wt Readings from Last 3 Encounters:  11/18/20 271 lb 4 oz (123 kg)  10/23/20 280 lb (127 kg)  10/15/20 290 lb 2 oz (131.6 kg)     Health Maintenance Due  Topic Date Due   MAMMOGRAM  08/18/2020    There are no preventive care reminders to display for this patient.  Lab Results  Component Value Date   TSH 1.70 08/11/2019   Lab Results  Component Value Date   WBC  6.1 10/10/2020   HGB 10.9 (L) 10/10/2020   HCT 34.6 10/10/2020    MCV 85 10/10/2020   PLT 187 10/10/2020   Lab Results  Component Value Date   NA 141 10/10/2020   K 4.1 10/10/2020   CO2 25 10/10/2020   GLUCOSE 96 10/10/2020   BUN 10 10/10/2020   CREATININE 0.96 10/10/2020   BILITOT 0.4 10/10/2020   ALKPHOS 87 10/10/2020   AST 19 10/10/2020   ALT 16 10/10/2020   PROT 6.5 10/10/2020   ALBUMIN 3.9 10/10/2020   CALCIUM 10.2 10/10/2020   EGFR 68 10/10/2020   Lab Results  Component Value Date   CHOL 164 10/10/2020   Lab Results  Component Value Date   HDL 47 10/10/2020   Lab Results  Component Value Date   LDLCALC 104 (H) 10/10/2020   Lab Results  Component Value Date   TRIG 66 10/10/2020   Lab Results  Component Value Date   CHOLHDL 3.5 10/10/2020   Lab Results  Component Value Date   HGBA1C 4.9 10/10/2020      Assessment & Plan:   Kamica was seen today for obesity.  Diagnoses and all orders for this visit:  Morbid obesity ([place]) Doing well without side effects on Wegovy. Increase to 0.5 mg weekly x 4 weeks. Then increase to 1 mg weekly x 4 weeks. Follow up in 2 months.  -     Semaglutide-Weight Management 0.5 MG/0.5ML SOAJ; Inject 0.5 mg into the skin once a week. For 4 weeks. -     Semaglutide-Weight Management 1 MG/0.5ML SOAJ; Inject 1 mg into the skin once a week. Weekly x 4 weeks.    Follow-up: Return in about 8 weeks (around 01/15/2021) for chronic follow up.   The patient indicates understanding of these issues and agrees with the plan.   Gwenlyn Perking, FNP

## 2020-11-19 ENCOUNTER — Other Ambulatory Visit: Payer: Self-pay

## 2020-11-19 ENCOUNTER — Telehealth: Payer: Self-pay | Admitting: Family Medicine

## 2020-11-19 MED ORDER — SEMAGLUTIDE-WEIGHT MANAGEMENT 1 MG/0.5ML ~~LOC~~ SOAJ: 1.0000 mg | SUBCUTANEOUS | 0 refills | Status: DC

## 2020-11-19 MED ORDER — SEMAGLUTIDE-WEIGHT MANAGEMENT 0.5 MG/0.5ML ~~LOC~~ SOAJ: 0.5000 mg | SUBCUTANEOUS | 0 refills | Status: DC

## 2020-11-19 NOTE — Telephone Encounter (Signed)
Informed patient that Hannah [name] sent in the medications yesterday. Pharmacy confirmed receipt. Pt states that she checked with them last night and they do not have it. Resent Ozempic injections to [facility] in [place].

## 2020-12-17 ENCOUNTER — Telehealth: Payer: Self-pay | Admitting: Family Medicine

## 2020-12-17 NOTE — Telephone Encounter (Signed)
Patient aware and verbalized understanding. °

## 2020-12-17 NOTE — Telephone Encounter (Signed)
Please have her force fluids then use BRAT diet.

## 2020-12-17 NOTE — Telephone Encounter (Signed)
Pt called stating that her Ozempic dosage was increased which made her constipated so she took 3 Colace and now she cant stop going to the bathroom.  Needs advise. Please call patient at [phone]

## 2020-12-19 ENCOUNTER — Other Ambulatory Visit: Payer: Self-pay | Admitting: Family Medicine

## 2020-12-19 DIAGNOSIS — G8929 Other chronic pain: Secondary | ICD-10-CM

## 2020-12-25 ENCOUNTER — Other Ambulatory Visit: Payer: Self-pay | Admitting: Family Medicine

## 2020-12-25 DIAGNOSIS — F331 Major depressive disorder, recurrent, moderate: Secondary | ICD-10-CM

## 2020-12-30 ENCOUNTER — Telehealth: Payer: Self-pay | Admitting: Family Medicine

## 2020-12-30 NOTE — Telephone Encounter (Signed)
Pt aware that [facility] told me that they had the Rx on file & they are getting it ready for her.

## 2020-12-30 NOTE — Telephone Encounter (Signed)
  Prescription Request  12/30/2020  What is the name of the medication or equipment? Ozempic 1 mg  Have you contacted your pharmacy to request a refill? (if applicable) YES  Which pharmacy would you like this sent to? [facility] in [place]   Patient notified that their request is being sent to the clinical staff for review and that they should receive a response within 2 business days.

## 2021-01-13 ENCOUNTER — Other Ambulatory Visit: Payer: Self-pay | Admitting: Family Medicine

## 2021-01-15 ENCOUNTER — Ambulatory Visit (INDEPENDENT_AMBULATORY_CARE_PROVIDER_SITE_OTHER): Payer: Medicare Other | Admitting: Family Medicine

## 2021-01-15 ENCOUNTER — Encounter: Payer: Self-pay | Admitting: Family Medicine

## 2021-01-15 ENCOUNTER — Other Ambulatory Visit: Payer: Self-pay

## 2021-01-15 DIAGNOSIS — F331 Major depressive disorder, recurrent, moderate: Secondary | ICD-10-CM

## 2021-01-15 DIAGNOSIS — F419 Anxiety disorder, unspecified: Secondary | ICD-10-CM

## 2021-01-15 DIAGNOSIS — R5383 Other fatigue: Secondary | ICD-10-CM

## 2021-01-15 DIAGNOSIS — M542 Cervicalgia: Secondary | ICD-10-CM

## 2021-01-15 DIAGNOSIS — F3340 Major depressive disorder, recurrent, in remission, unspecified: Secondary | ICD-10-CM | POA: Diagnosis not present

## 2021-01-15 DIAGNOSIS — F5101 Primary insomnia: Secondary | ICD-10-CM | POA: Diagnosis not present

## 2021-01-15 DIAGNOSIS — E7800 Pure hypercholesterolemia, unspecified: Secondary | ICD-10-CM | POA: Diagnosis not present

## 2021-01-15 DIAGNOSIS — M545 Low back pain, unspecified: Secondary | ICD-10-CM

## 2021-01-15 DIAGNOSIS — G8929 Other chronic pain: Secondary | ICD-10-CM

## 2021-01-15 DIAGNOSIS — Z79899 Other long term (current) drug therapy: Secondary | ICD-10-CM

## 2021-01-15 MED ORDER — OXYCODONE HCL 10 MG PO TABS: 10.0000 mg | ORAL_TABLET | Freq: Three times a day (TID) | ORAL | 0 refills | Status: DC | PRN

## 2021-01-15 MED ORDER — TRAZODONE HCL 100 MG PO TABS: 200.0000 mg | ORAL_TABLET | Freq: Every day | ORAL | 1 refills | Status: DC

## 2021-01-15 MED ORDER — SEMAGLUTIDE (2 MG/DOSE) 8 MG/3ML ~~LOC~~ SOPN: 2.0000 mg | PEN_INJECTOR | SUBCUTANEOUS | 0 refills | Status: DC

## 2021-01-15 MED ORDER — OXYCODONE HCL 10 MG PO TABS: 10.0000 mg | ORAL_TABLET | Freq: Three times a day (TID) | ORAL | 0 refills | Status: AC | PRN

## 2021-01-15 MED ORDER — ZIPRASIDONE HCL 20 MG PO CAPS: 20.0000 mg | ORAL_CAPSULE | Freq: Two times a day (BID) | ORAL | 1 refills | Status: DC

## 2021-01-15 MED ORDER — BACLOFEN 10 MG PO TABS
10.0000 mg | ORAL_TABLET | Freq: Three times a day (TID) | ORAL | 2 refills | Status: DC | PRN
Start: 2021-01-15 — End: 2021-04-16

## 2021-01-15 NOTE — Patient Instructions (Signed)
https://doi.[email or web address]">  Chronic Back Pain When back pain lasts longer than 3 months, it is called chronic back pain. The cause of your back pain may not be known. Some common causes include: Wear and tear (degenerative disease) of the bones, ligaments, or disks in your back. Inflammation and stiffness in your back (arthritis). People who have chronic back pain often go through certain periods in which the pain is more intense (flare-ups). Many people can learn to manage the pain with home care. Follow these instructions at home: Pay attention to any changes in your symptoms. Take these actions to help withyour pain: Managing pain and stiffness     If directed, apply ice to the painful area. Your health care provider may recommend applying ice during the first 24-48 hours after a flare-up begins. To do this: Put ice in a plastic bag. Place a towel between your skin and the bag. Leave the ice on for 20 minutes, 2-3 times per day. If directed, apply heat to the affected area as often as told by your health care provider. Use the heat source that your health care provider recommends, such as a moist heat pack or a heating pad. Place a towel between your skin and the heat source. Leave the heat on for 20-30 minutes. Remove the heat if your skin turns bright red. This is especially important if you are unable to feel pain, heat, or cold. You may have a greater risk of getting burned. Try soaking in a warm tub. Activity  Avoid bending and other activities that make the problem worse. Maintain a proper position when standing or sitting: When standing, keep your upper back and neck straight, with your shoulders pulled back. Avoid slouching. When sitting, keep your back straight and relax your shoulders. Do not round your shoulders or pull them backward. Do not sit or stand in one place for long periods of time. Take brief periods of rest throughout the day. This will reduce your  pain. Resting in a lying or standing position is usually better than sitting to rest. When you are resting for longer periods, mix in some mild activity or stretching between periods of rest. This will help to prevent stiffness and pain. Get regular exercise. Ask your health care provider what activities are safe for you. Do not lift anything that is heavier than 10 lb (4.5 kg), or the limit that you are told, until your health care provider says that it is safe. Always use proper lifting technique, which includes: Bending your knees. Keeping the load close to your body. Avoiding twisting. Sleep on a firm mattress in a comfortable position. Try lying on your side with your knees slightly bent. If you lie on your back, put a pillow under your knees.  Medicines Treatment may include medicines for pain and inflammation taken by mouth or applied to the skin, prescription pain medicine, or muscle relaxants. Take over-the-counter and prescription medicines only as told by your health care provider. Ask your health care provider if the medicine prescribed to you: Requires you to avoid driving or using machinery. Can cause constipation. You may need to take these actions to prevent or treat constipation: Drink enough fluid to keep your urine pale yellow. Take over-the-counter or prescription medicines. Eat foods that are high in fiber, such as beans, whole grains, and fresh fruits and vegetables. Limit foods that are high in fat and processed sugars, such as fried or sweet foods. General instructions Do not use any products that contain   nicotine or tobacco, such as cigarettes, e-cigarettes, and chewing tobacco. If you need help quitting, ask your health care provider. Keep all follow-up visits as told by your health care provider. This is important. Contact a health care provider if: You have pain that is not relieved with rest or medicine. Your pain gets worse, or you have new pain. You have a high  fever. You have rapid weight loss. You have trouble doing your normal activities. Get help right away if: You have weakness or numbness in one or both of your legs or feet. You have trouble controlling your bladder or your bowels. You have severe back pain and have any of the following: Nausea or vomiting. Pain in your abdomen. Shortness of breath or you faint. Summary Chronic back pain is back pain that lasts longer than 3 months. When a flare-up begins, apply ice to the painful area for the first 24-48 hours. Apply a moist heat pad or use a heating pad on the painful area as directed by your health care provider. When you are resting for longer periods, mix in some mild activity or stretching between periods of rest. This will help to prevent stiffness and pain. This information is not intended to replace advice given to you by your health care provider. Make sure you discuss any questions you have with your healthcare provider. Document Revised: 07/05/2019 Document Reviewed: 07/05/2019 Elsevier Patient Education  2022 [employer]  

## 2021-01-15 NOTE — Progress Notes (Signed)
Established Patient Office Visit  Subjective:  Patient ID: Hannah [name]. Pitones, female    DOB: 1961-03-08  Age: 60 y.o. MRN: [id number]  CC:  Chief Complaint  Patient presents with   Medical Management of Chronic Issues    HPI [name] presents for chronic follow up. She has no new concerns today.   1. Depression/anxiety Reports doing well with Zoloft and geodon. Buspar TID for anxiety as well. Reports doing well.  Depression screen [facility] 2/9 01/15/2021 11/05/2020 10/15/2020  Decreased Interest 0 0 0  Down, Depressed, Hopeless 0 0 0  PHQ - 2 Score 0 0 0  Altered sleeping 1 - 0  Tired, decreased energy 1 - 3  Change in appetite 0 - 0  Feeling bad or failure about yourself  0 - 0  Trouble concentrating 0 - 0  Moving slowly or fidgety/restless 0 - 0  Suicidal thoughts 0 - 0  PHQ-9 Score 2 - 3  Difficult doing work/chores Not difficult at all - Somewhat difficult   GAD 7 : Generalized Anxiety Score 01/15/2021 10/15/2020 04/12/2020 01/12/2020  Nervous, Anxious, on Edge 0 0 0 1  Control/stop worrying 0 1 1 1   Worry too much - different things 0 3 1 1   Trouble relaxing 0 0 0 0  Restless 0 0 0 0  Easily annoyed or irritable 0 0 1 1  Afraid - awful might happen 0 0 0 0  Total GAD 7 Score 0 4 3 4   Anxiety Difficulty Not difficult at all Somewhat difficult Not difficult at all -    2. Obesity 18 lb weight loss since last visit. Has been on Ozempic 1 mg weekly. Reports doing well with this. Reports constipation as only side effect. She has been lean meats, vegetables, seafood, some fruits. Has been walking on treadmill daily for 45 minutes.   3. Chronic back pain Takes oxycodone for chronic back pain. 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    Pain assessment: Cause of pain- forklift injury when she was in her early 20's Pain location- low back Pain on scale of 1-10- 4/10 currently Frequency- daily What increases pain-house work What makes pain  Better-resting Effects on ADL - none Any change in general medical condition-none   Current opioids rx- oxycodone 10 TID # meds rx- 90 Effectiveness of current meds-helps Adverse reactions from pain meds-none Morphine equivalent- 45MME   Pill count performed-No Last drug screen - 01/12/20 ( high risk q5m moderate risk q679mlow risk yearly ) Urine drug screen today- No Was the [place]- yes             If yes were their any concerning findings? - none     Overdose risk: 220 Opioid Risk  01/18/2020  Alcohol 3  Illegal Drugs 0  Rx Drugs 0  Alcohol 3  Illegal Drugs 0  Rx Drugs 0  Age between 16-45 years  0  History of Preadolescent Sexual Abuse 3  Psychological Disease 0  Depression 1  Opioid Risk Tool Scoring 10  Opioid Risk Interpretation High Risk    Pain contract signed: 01/23/20  5. Insomnia Reports trazodone is working well.    7. Chronic neck pain Takes baclofen as needed with good relief.   Past Medical History:  Diagnosis Date   Acquired dilation of bile duct    Alcohol abuse    Anxiety    Blood type O+    Chronic neck and  back pain    Compulsive skin picking 1998   Depression    Drug abuse ([place])    GERD (gastroesophageal reflux disease)    History of iron deficiency anemia    Hyperlipidemia    Insomnia    Migraines    Osteoporosis 07/08/2017   T-score -2.6 in 2019; -2.2 on 08/05/2018   Vitamin D deficiency     Past Surgical History:  Procedure Laterality Date   ABDOMINAL HYSTERECTOMY  09/07/2006   CESAREAN SECTION  02/16/1990   CHOLECYSTECTOMY  2001   ENDOMETRIAL ABLATION W/ [place]     GASTRIC BYPASS  1998   HEMORRHOID SURGERY  2003   HERNIA REPAIR  2004   KNEE ARTHROSCOPY Right 2008   MOUTH SURGERY     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    Family History  Problem Relation Age of Onset   Anxiety disorder Mother    Depression Mother    Breast cancer Mother    Ovarian cancer Mother    Alcohol abuse Father    Heart disease  Father    Kidney disease Brother    Heart disease Brother    Breast cancer Maternal Aunt    Breast cancer Maternal Grandmother    Breast cancer Maternal Grandfather     Social History   Socioeconomic History   Marital status: Single    Spouse name: Not on file   Number of children: 1   Years of education: Not on file   Highest education level: Some college, no degree  Occupational History   Occupation: disabled  Tobacco Use   Smoking status: Former    Types: Cigarettes    Quit date: 1991    Years since quitting: 31.6   Smokeless tobacco: Never  Vaping Use   Vaping Use: Never used  Substance and Sexual Activity   Alcohol use: Not Currently   Drug use: Not Currently   Sexual activity: Not Currently  Other Topics Concern   Not on file  Social History Narrative   Lives alone, one daughter that lives in [place]. Loves to read and go on walks.    Social Determinants of Health   Financial Resource Strain: Not on file  Food Insecurity: Not on file  Transportation Needs: Not on file  Physical Activity: Not on file  Stress: Not on file  Social Connections: Not on file  Intimate Partner Violence: Not on file    Outpatient Medications Prior to Visit  Medication Sig Dispense Refill   B Complex-C (SUPER B COMPLEX PO) Take 1 tablet by mouth daily.     baclofen (LIORESAL) 10 MG tablet Take 1 tablet by mouth three times daily as needed for muscle spasm 90 tablet 0   busPIRone (BUSPAR) 15 MG tablet Take 1 tablet (15 mg total) by mouth in the morning, at noon, and at bedtime. 90 tablet 2   celecoxib (CELEBREX) 200 MG capsule Take 1 capsule (200 mg total) by mouth daily. 90 capsule 1   Cholecalciferol (VITAMIN D3) 125 MCG (5000 UT) CAPS Take 1 capsule by mouth daily.     CINNAMON PO Take by mouth.     diphenhydrAMINE (BENADRYL) 25 mg capsule Take 50 mg by mouth daily as needed.     famotidine (PEPCID) 20 MG tablet Take 20 mg by mouth daily.     Oxycodone HCl 10 MG TABS Take 1 tablet  (10 mg total) by mouth every 8 (eight) hours as needed. 90 tablet 0   polyethylene glycol powder (GLYCOLAX/MIRALAX)  17 GM/SCOOP powder Take 17 g by mouth daily. 3350 g 1   Semaglutide-Weight Management 0.5 MG/0.5ML SOAJ Inject 0.5 mg into the skin once a week. For 4 weeks. 2 mL 0   Semaglutide-Weight Management 1 MG/0.5ML SOAJ Inject 1 mg into the skin once a week. Weekly x 4 weeks. 2 mL 0   sertraline (ZOLOFT) 100 MG tablet TAKE 1 TABLET BY MOUTH IN THE MORNING AND AT BEDTIME 180 tablet 0   traZODone (DESYREL) 100 MG tablet Take 2 tablets (200 mg total) by mouth at bedtime. 90 tablet 1   ziprasidone (GEODON) 20 MG capsule Take 1 capsule (20 mg total) by mouth in the morning and at bedtime. 180 capsule 1   No facility-administered medications prior to visit.    Allergies  Allergen Reactions   Demerol [Meperidine] Other (See Comments)    disorientation    Other Other (See Comments)    Disoriented and nausea   Latex Other (See Comments)    blisters   Morphine Palpitations    SLOW HEART RATE     ROS Review of Systems Negative unless specially indicated above in HPI.    Objective:    Physical Exam Constitutional:      General: She is not in acute distress.    Appearance: She is well-developed. She is not ill-appearing, toxic-appearing or diaphoretic.  Neck:     Thyroid: No thyromegaly.     Vascular: No carotid bruit or JVD.     Trachea: Trachea normal.  Cardiovascular:     Rate and Rhythm: Normal rate and regular rhythm.     Heart sounds: Normal heart sounds. No murmur heard.   No friction rub. No gallop.  Pulmonary:     Effort: Pulmonary effort is normal.     Breath sounds: Normal breath sounds.  Abdominal:     General: Bowel sounds are normal. There is no distension.     Palpations: Abdomen is soft. There is no mass.     Tenderness: There is no abdominal tenderness.  Musculoskeletal:     Cervical back: Full passive range of motion without pain, normal range of motion  and neck supple.     Right lower leg: No edema.     Left lower leg: No edema.  Lymphadenopathy:     Cervical: No cervical adenopathy.  Skin:    General: Skin is warm and dry.  Neurological:     General: No focal deficit present.     Mental Status: She is alert and oriented to person, place, and time.     Deep Tendon Reflexes: Reflexes are normal and symmetric.  Psychiatric:        Behavior: Behavior normal.        Thought Content: Thought content normal.        Judgment: Judgment normal.    BP 102/67   Pulse 87   Temp 98.3 F (36.8 C) (Oral)   Ht 5' 5"  (1.651 m)   Wt 253 lb 6 oz (114.9 kg)   BMI 42.16 kg/m  Wt Readings from Last 3 Encounters:  01/15/21 253 lb 6 oz (114.9 kg)  11/18/20 271 lb 4 oz (123 kg)  10/23/20 280 lb (127 kg)     Health Maintenance Due  Topic Date Due   MAMMOGRAM  08/18/2020    There are no preventive care reminders to display for this patient.  Lab Results  Component Value Date   TSH 1.70 08/11/2019   Lab Results  Component Value Date  WBC 6.1 10/10/2020   HGB 10.9 (L) 10/10/2020   HCT 34.6 10/10/2020   MCV 85 10/10/2020   PLT 187 10/10/2020   Lab Results  Component Value Date   NA 141 10/10/2020   K 4.1 10/10/2020   CO2 25 10/10/2020   GLUCOSE 96 10/10/2020   BUN 10 10/10/2020   CREATININE 0.96 10/10/2020   BILITOT 0.4 10/10/2020   ALKPHOS 87 10/10/2020   AST 19 10/10/2020   ALT 16 10/10/2020   PROT 6.5 10/10/2020   ALBUMIN 3.9 10/10/2020   CALCIUM 10.2 10/10/2020   EGFR 68 10/10/2020   Lab Results  Component Value Date   CHOL 164 10/10/2020   Lab Results  Component Value Date   HDL 47 10/10/2020   Lab Results  Component Value Date   LDLCALC 104 (H) 10/10/2020   Lab Results  Component Value Date   TRIG 66 10/10/2020   Lab Results  Component Value Date   CHOLHDL 3.5 10/10/2020   Lab Results  Component Value Date   HGBA1C 4.9 10/10/2020      Assessment & Plan:   Velita was seen today for  pain.  Diagnoses and all orders for this visit:  Morbid obesity ([place]) Doing well with ozempic for weight loss. Has been on 1 mg for 1 month. Will increase to 2 mg weekly. Continue diet and exercise. Discussed water, fiber, and miralax for constipation side effects.  Pure hypercholesterolemia LDL 104. ASCVD risk is 2%. Diet and exercise.   Chronic bilateral low back pain without sciatica Well controlled on current regimen. PDMP reviewed, no red flags. Refills provided. UTD on UDS and CSA.  -     Oxycodone HCl 10 MG TABS; Take 1 tablet (10 mg total) by mouth every 8 (eight) hours as needed. -     Oxycodone HCl 10 MG TABS; Take 1 tablet (10 mg total) by mouth every 8 (eight) hours as needed. -     Oxycodone HCl 10 MG TABS; Take 1 tablet (10 mg total) by mouth every 8 (eight) hours as needed.  Chronic neck pain Baclofen prn.   Moderate episode of recurrent major depressive disorder (HCC) Well controlled on current regimen.  -     sertraline (ZOLOFT) 100 MG tablet; Take 1 tablet (100 mg total) by mouth in the morning and at bedtime.  Anxiety Well controlled on current regimen of zoloft and buspar.   Primary insomnia Well controlled on trazodone.    Follow-up: Return in about 5 weeks (around 02/19/2021) for weight management.   The patient indicates understanding of these issues and agrees with the plan.  Gwenlyn Perking, FNP

## 2021-01-16 LAB — CMP14+EGFR
ALT: 21 IU/L (ref 0–32)
AST: 23 IU/L (ref 0–40)
Albumin/Globulin Ratio: 1.7 (ref 1.2–2.2)
Albumin: 3.9 g/dL (ref 3.8–4.9)
Alkaline Phosphatase: 90 IU/L (ref 44–121)
BUN/Creatinine Ratio: 8 — ABNORMAL LOW (ref 9–23)
BUN: 9 mg/dL (ref 6–24)
Bilirubin Total: 0.4 mg/dL (ref 0.0–1.2)
CO2: 25 mmol/L (ref 20–29)
Calcium: 10.7 mg/dL — ABNORMAL HIGH (ref 8.7–10.2)
Chloride: 105 mmol/L (ref 96–106)
Creatinine, Ser: 1.13 mg/dL — ABNORMAL HIGH (ref 0.57–1.00)
Globulin, Total: 2.3 g/dL (ref 1.5–4.5)
Glucose: 76 mg/dL (ref 65–99)
Potassium: 4.2 mmol/L (ref 3.5–5.2)
Sodium: 143 mmol/L (ref 134–144)
Total Protein: 6.2 g/dL (ref 6.0–8.5)
eGFR: 56 mL/min/{1.73_m2} — ABNORMAL LOW (ref >59)

## 2021-01-16 LAB — CBC WITH DIFFERENTIAL/PLATELET
Basophils Absolute: 0 10*3/uL (ref 0.0–0.2)
Basos: 1 %
EOS (ABSOLUTE): 0.2 10*3/uL (ref 0.0–0.4)
Eos: 3 %
Hematocrit: 38.5 % (ref 34.0–46.6)
Hemoglobin: 12.4 g/dL (ref 11.1–15.9)
Immature Grans (Abs): 0 10*3/uL (ref 0.0–0.1)
Immature Granulocytes: 0 %
Lymphocytes Absolute: 0.9 10*3/uL (ref 0.7–3.1)
Lymphs: 17 %
MCH: 29.7 pg (ref 26.6–33.0)
MCHC: 32.2 g/dL (ref 31.5–35.7)
MCV: 92 fL (ref 79–97)
Monocytes Absolute: 0.3 10*3/uL (ref 0.1–0.9)
Monocytes: 6 %
Neutrophils Absolute: 4 10*3/uL (ref 1.4–7.0)
Neutrophils: 73 %
Platelets: 165 10*3/uL (ref 150–450)
RBC: 4.17 x10E6/uL (ref 3.77–5.28)
RDW: 15.2 % (ref 11.7–15.4)
WBC: 5.4 10*3/uL (ref 3.4–10.8)

## 2021-01-16 LAB — TSH: TSH: 2.64 u[IU]/mL (ref 0.450–4.500)

## 2021-01-21 ENCOUNTER — Telehealth: Payer: Self-pay | Admitting: Family Medicine

## 2021-01-21 NOTE — Telephone Encounter (Signed)
Looks like 2 mg was what was sent in but it was sent as generic.  Not sure patient's insurance will cover brand name but guess we can try.

## 2021-01-21 NOTE — Telephone Encounter (Signed)
2 mg was sent in for patient. This does not come in a generic.

## 2021-01-21 NOTE — Telephone Encounter (Signed)
Patient states she keeps having issues with ozempic states she had this prob last month

## 2021-01-22 ENCOUNTER — Other Ambulatory Visit: Payer: Self-pay | Admitting: Family Medicine

## 2021-01-22 MED ORDER — SEMAGLUTIDE (2 MG/DOSE) 8 MG/3ML ~~LOC~~ SOPN: 2.0000 mg | PEN_INJECTOR | SUBCUTANEOUS | 0 refills | Status: DC

## 2021-01-22 NOTE — Telephone Encounter (Signed)
Rx sent in again.

## 2021-01-22 NOTE — Telephone Encounter (Signed)
Pt aware rx sent in again.

## 2021-01-27 ENCOUNTER — Telehealth: Payer: Self-pay | Admitting: Family Medicine

## 2021-01-27 ENCOUNTER — Other Ambulatory Visit: Payer: Self-pay | Admitting: Family Medicine

## 2021-01-27 MED ORDER — SEMAGLUTIDE-WEIGHT MANAGEMENT 1 MG/0.5ML ~~LOC~~ SOAJ: 1.0000 mg | SUBCUTANEOUS | 0 refills | Status: DC

## 2021-01-27 NOTE — Telephone Encounter (Signed)
Ordered

## 2021-01-27 NOTE — Telephone Encounter (Signed)
Left message rx has been sent. 

## 2021-01-28 ENCOUNTER — Telehealth: Payer: Self-pay | Admitting: Family Medicine

## 2021-01-28 MED ORDER — SEMAGLUTIDE (1 MG/DOSE) 4 MG/3ML ~~LOC~~ SOPN: 1.0000 mg | PEN_INJECTOR | SUBCUTANEOUS | 1 refills | Status: DC

## 2021-01-28 NOTE — Telephone Encounter (Signed)
Rx resent for ozempic

## 2021-02-14 ENCOUNTER — Other Ambulatory Visit: Payer: Self-pay | Admitting: Family Medicine

## 2021-02-14 DIAGNOSIS — G8929 Other chronic pain: Secondary | ICD-10-CM

## 2021-02-24 ENCOUNTER — Ambulatory Visit: Payer: Medicare Other | Admitting: Family Medicine

## 2021-02-25 ENCOUNTER — Other Ambulatory Visit: Payer: Self-pay | Admitting: Family Medicine

## 2021-02-25 DIAGNOSIS — F419 Anxiety disorder, unspecified: Secondary | ICD-10-CM

## 2021-02-26 ENCOUNTER — Other Ambulatory Visit: Payer: Self-pay

## 2021-02-28 ENCOUNTER — Encounter: Payer: Self-pay | Admitting: Family Medicine

## 2021-02-28 ENCOUNTER — Ambulatory Visit (INDEPENDENT_AMBULATORY_CARE_PROVIDER_SITE_OTHER): Payer: Medicare Other | Admitting: Family Medicine

## 2021-02-28 DIAGNOSIS — U071 COVID-19: Secondary | ICD-10-CM

## 2021-02-28 MED ORDER — MOLNUPIRAVIR EUA 200MG CAPSULE: 4.0000 | ORAL_CAPSULE | Freq: Two times a day (BID) | ORAL | 0 refills | Status: AC

## 2021-02-28 MED ORDER — ONDANSETRON HCL 4 MG PO TABS: 4.0000 mg | ORAL_TABLET | Freq: Three times a day (TID) | ORAL | 0 refills | Status: DC | PRN

## 2021-02-28 NOTE — Progress Notes (Signed)
   Virtual Visit  Note Due to COVID-19 pandemic this visit was conducted virtually. This visit type was conducted due to national recommendations for restrictions regarding the COVID-19 Pandemic (e.g. social distancing, sheltering in place) in an effort to limit this patient's exposure and mitigate transmission in our community. All issues noted in this document were discussed and addressed.  A physical exam was not performed with this format.  I connected with Hannah [name]. Tat on 02/28/21 at 1312 by telephone and verified that I am speaking with the correct person using two identifiers. Hannah [name]. Hannah [name] is currently located at home and no one is currently with her during the visit. The provider, Gabriel Earing, FNP is located in their office at time of visit.  I discussed the limitations, risks, security and privacy concerns of performing an evaluation and management service by telephone and the availability of in person appointments. I also discussed with the patient that there may be a patient responsible charge related to this service. The patient expressed understanding and agreed to proceed.  CC: Covid  History and Present Illness:  HPI Hannah [name] reports testing positive for Covid today with a home test. She reports symptoms that started yesterday. She has a HA, fever of 101, chills, body aches, fatigue, sore throat, nausea, and diarrhea. She has taken naproxen for her symptoms. She denies cough, congestion, vomiting, chest pain, or shorntess of breath. She has been vaccinated and had 2 boosters.     ROS As per HPI.   Observations/Objective: Alert and oriented x 3. Able to speak in full sentences without difficulty.   Assessment and Plan: [name] was seen today for covid positive.  Diagnoses and all orders for this visit:  COVID-19 Discussed antiviral therapy with molnupiravir. Discussed symptomatic care. Zofran ordered. Discussed quarantine, return precautions, and when to seek emergency  care.  -     molnupiravir EUA (LAGEVRIO) 200 mg CAPS capsule; Take 4 capsules (800 mg total) by mouth 2 (two) times daily for 5 days. -     ondansetron (ZOFRAN) 4 MG tablet; Take 1 tablet (4 mg total) by mouth every 8 (eight) hours as needed for nausea or vomiting.    Follow Up Instructions: As needed.     I discussed the assessment and treatment plan with the patient. The patient was provided an opportunity to ask questions and all were answered. The patient agreed with the plan and demonstrated an understanding of the instructions.   The patient was advised to call back or seek an in-person evaluation if the symptoms worsen or if the condition fails to improve as anticipated.  The above assessment and management plan was discussed with the patient. The patient verbalized understanding of and has agreed to the management plan. Patient is aware to call the clinic if symptoms persist or worsen. Patient is aware when to return to the clinic for a follow-up visit. Patient educated on when it is appropriate to go to the emergency department.   Time call ended:  1324  I provided 12 minutes of  non face-to-face time during this encounter.    Gabriel Earing, FNP

## 2021-03-17 ENCOUNTER — Ambulatory Visit (INDEPENDENT_AMBULATORY_CARE_PROVIDER_SITE_OTHER): Payer: Medicare Other | Admitting: Family Medicine

## 2021-03-17 ENCOUNTER — Encounter: Payer: Self-pay | Admitting: Family Medicine

## 2021-03-17 ENCOUNTER — Other Ambulatory Visit: Payer: Self-pay

## 2021-03-17 DIAGNOSIS — Z23 Encounter for immunization: Secondary | ICD-10-CM

## 2021-03-17 DIAGNOSIS — F419 Anxiety disorder, unspecified: Secondary | ICD-10-CM

## 2021-03-17 DIAGNOSIS — F3340 Major depressive disorder, recurrent, in remission, unspecified: Secondary | ICD-10-CM

## 2021-03-17 NOTE — Progress Notes (Signed)
Established Patient Office Visit  Subjective:  Patient ID: Hannah [name]. Landowski, female    DOB: 1961/06/07  Age: 60 y.o. MRN: [id number]  CC:  Chief Complaint  Patient presents with   Obesity    HPI [name]. [name] presents for weight management follow up.   Obesity She reports doing well with her diet. She is walking daily for about 25 minutes. She is also doing well on ozempic without side effects. She has noticed an improvement in her weight loss since increasing to the 2 mg dosage 3 weeks ago. She has lost 9 pounds since her last visit.   Wt Readings from Last 3 Encounters:  03/17/21 244 lb 4 oz (110.8 kg)  01/15/21 253 lb 6 oz (114.9 kg)  11/18/20 271 lb 4 oz (123 kg)    Depression screen [facility] 2/9 03/17/2021 01/15/2021 11/05/2020  Decreased Interest 0 0 0  Down, Depressed, Hopeless 1 0 0  PHQ - 2 Score 1 0 0  Altered sleeping 1 1 -  Tired, decreased energy 1 1 -  Change in appetite 0 0 -  Feeling bad or failure about yourself  0 0 -  Trouble concentrating 0 0 -  Moving slowly or fidgety/restless 0 0 -  Suicidal thoughts 0 0 -  PHQ-9 Score 3 2 -  Difficult doing work/chores Not difficult at all Not difficult at all -   GAD 7 : Generalized Anxiety Score 03/17/2021 01/15/2021 10/15/2020 04/12/2020  Nervous, Anxious, on Edge 1 0 0 0  Control/stop worrying 0 0 1 1  Worry too much - different things 0 0 3 1  Trouble relaxing 1 0 0 0  Restless 0 0 0 0  Easily annoyed or irritable 0 0 0 1  Afraid - awful might happen 0 0 0 0  Total GAD 7 Score 2 0 4 3  Anxiety Difficulty Not difficult at all Not difficult at all Somewhat difficult Not difficult at all      Past Medical History:  Diagnosis Date   Acquired dilation of bile duct    Alcohol abuse    Anxiety    Blood type O+    Chronic neck and back pain    Compulsive skin picking 1998   Depression    Drug abuse ([place])    GERD (gastroesophageal reflux disease)    History of iron deficiency anemia    Hyperlipidemia     Insomnia    Migraines    Osteoporosis 07/08/2017   T-score -2.6 in 2019; -2.2 on 08/05/2018   Vitamin D deficiency     Past Surgical History:  Procedure Laterality Date   ABDOMINAL HYSTERECTOMY  09/07/2006   CESAREAN SECTION  02/16/1990   CHOLECYSTECTOMY  2001   ENDOMETRIAL ABLATION W/ [place]     GASTRIC BYPASS  1998   HEMORRHOID SURGERY  2003   HERNIA REPAIR  2004   KNEE ARTHROSCOPY Right 2008   MOUTH SURGERY     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    Family History  Problem Relation Age of Onset   Anxiety disorder Mother    Depression Mother    Breast cancer Mother    Ovarian cancer Mother    Alcohol abuse Father    Heart disease Father    Kidney disease Brother    Heart disease Brother    Breast cancer Maternal Aunt    Breast cancer Maternal Grandmother    Breast cancer Maternal Grandfather     Social History   Socioeconomic  History   Marital status: Single    Spouse name: Not on file   Number of children: 1   Years of education: Not on file   Highest education level: Some college, no degree  Occupational History   Occupation: disabled  Tobacco Use   Smoking status: Former    Types: Cigarettes    Quit date: 1991    Years since quitting: 31.7   Smokeless tobacco: Never  Vaping Use   Vaping Use: Never used  Substance and Sexual Activity   Alcohol use: Not Currently   Drug use: Not Currently   Sexual activity: Not Currently  Other Topics Concern   Not on file  Social History Narrative   Lives alone, one daughter that lives in [place]. Loves to read and go on walks.    Social Determinants of Health   Financial Resource Strain: Not on file  Food Insecurity: Not on file  Transportation Needs: Not on file  Physical Activity: Not on file  Stress: Not on file  Social Connections: Not on file  Intimate Partner Violence: Not on file    Outpatient Medications Prior to Visit  Medication Sig Dispense Refill   B Complex-C (SUPER B COMPLEX PO) Take 1  tablet by mouth daily.     baclofen (LIORESAL) 10 MG tablet Take 1 tablet (10 mg total) by mouth 3 (three) times daily as needed for muscle spasms. for muscle spams 90 tablet 2   busPIRone (BUSPAR) 15 MG tablet TAKE 1 TABLET BY MOUTH IN THE MORNING AND AT NOON AND AT BEDTIME 90 tablet 2   celecoxib (CELEBREX) 200 MG capsule Take 1 capsule by mouth once daily 90 capsule 0   Cholecalciferol (VITAMIN D3) 125 MCG (5000 UT) CAPS Take 1 capsule by mouth daily.     CINNAMON PO Take by mouth.     diphenhydrAMINE (BENADRYL) 25 mg capsule Take 50 mg by mouth daily as needed.     famotidine (PEPCID) 20 MG tablet Take 20 mg by mouth daily.     ondansetron (ZOFRAN) 4 MG tablet Take 1 tablet (4 mg total) by mouth every 8 (eight) hours as needed for nausea or vomiting. 20 tablet 0   Oxycodone HCl 10 MG TABS Take 1 tablet (10 mg total) by mouth every 8 (eight) hours as needed. 90 tablet 0   polyethylene glycol powder (GLYCOLAX/MIRALAX) 17 GM/SCOOP powder Take 17 g by mouth daily. 3350 g 1   Semaglutide, 2 MG/DOSE, 8 MG/3ML SOPN Inject 2 mg as directed once a week. 9 mL 0   sertraline (ZOLOFT) 100 MG tablet TAKE 1 TABLET BY MOUTH IN THE MORNING AND AT BEDTIME 180 tablet 0   traZODone (DESYREL) 100 MG tablet Take 2 tablets (200 mg total) by mouth at bedtime. 90 tablet 1   ziprasidone (GEODON) 20 MG capsule Take 1 capsule (20 mg total) by mouth in the morning and at bedtime. 180 capsule 1   Semaglutide, 1 MG/DOSE, 4 MG/3ML SOPN Inject 1 mg as directed once a week. 3 mL 1   Semaglutide-Weight Management 1 MG/0.5ML SOAJ Inject 1 mg into the skin once a week. Weekly x 4 weeks. 2 mL 0   No facility-administered medications prior to visit.    Allergies  Allergen Reactions   Demerol [Meperidine] Other (See Comments)    disorientation    Other Other (See Comments)    Disoriented and nausea   Latex Other (See Comments)    blisters   Morphine Palpitations  SLOW HEART RATE     ROS Review of Systems As per  HPI.   Objective:    Physical Exam Vitals and nursing note reviewed.  Constitutional:      General: She is not in acute distress.    Appearance: She is not ill-appearing, toxic-appearing or diaphoretic.  Cardiovascular:     Rate and Rhythm: Normal rate and regular rhythm.     Heart sounds: Normal heart sounds. No murmur heard. Pulmonary:     Effort: Pulmonary effort is normal. No respiratory distress.     Breath sounds: Normal breath sounds.  Musculoskeletal:     Right lower leg: No edema.     Left lower leg: No edema.  Skin:    General: Skin is warm and dry.  Neurological:     General: No focal deficit present.     Mental Status: She is alert and oriented to person, place, and time.  Psychiatric:        Mood and Affect: Mood normal.        Behavior: Behavior normal.    BP 113/76   Pulse 72   Temp 97.9 F (36.6 C) (Temporal)   Ht 5' 5"  (1.651 m)   Wt 244 lb 4 oz (110.8 kg)   BMI 40.65 kg/m  Wt Readings from Last 3 Encounters:  03/17/21 244 lb 4 oz (110.8 kg)  01/15/21 253 lb 6 oz (114.9 kg)  11/18/20 271 lb 4 oz (123 kg)     Health Maintenance Due  Topic Date Due   MAMMOGRAM  08/18/2020    There are no preventive care reminders to display for this patient.  Lab Results  Component Value Date   TSH 2.640 01/15/2021   Lab Results  Component Value Date   WBC 5.4 01/15/2021   HGB 12.4 01/15/2021   HCT 38.5 01/15/2021   MCV 92 01/15/2021   PLT 165 01/15/2021   Lab Results  Component Value Date   NA 143 01/15/2021   K 4.2 01/15/2021   CO2 25 01/15/2021   GLUCOSE 76 01/15/2021   BUN 9 01/15/2021   CREATININE 1.13 (H) 01/15/2021   BILITOT 0.4 01/15/2021   ALKPHOS 90 01/15/2021   AST 23 01/15/2021   ALT 21 01/15/2021   PROT 6.2 01/15/2021   ALBUMIN 3.9 01/15/2021   CALCIUM 10.7 (H) 01/15/2021   EGFR 56 (L) 01/15/2021   Lab Results  Component Value Date   CHOL 164 10/10/2020   Lab Results  Component Value Date   HDL 47 10/10/2020   Lab  Results  Component Value Date   LDLCALC 104 (H) 10/10/2020   Lab Results  Component Value Date   TRIG 66 10/10/2020   Lab Results  Component Value Date   CHOLHDL 3.5 10/10/2020   Lab Results  Component Value Date   HGBA1C 4.9 10/10/2020      Assessment & Plan:   Shamon was seen today for obesity.  Diagnoses and all orders for this visit:  Morbid obesity ([place]) Doing well on Ozempic 2 mg with diet and exercise. Lost 9 pounds since last visit. BMI has decreased from 45 to 40 overall.   Depression, major, recurrent, in remission ([place]) Well controlled on current regimen.   Anxiety Well controlled on current regimen.   Need for immunization against influenza Flu vaccine today in office.    Follow-up: Return in about 4 weeks (around 04/14/2021) for chronic follow up.  The patient indicates understanding of these issues and agrees with the  plan.     Gwenlyn Perking, FNP

## 2021-03-26 ENCOUNTER — Other Ambulatory Visit: Payer: Self-pay | Admitting: Family Medicine

## 2021-03-26 DIAGNOSIS — F331 Major depressive disorder, recurrent, moderate: Secondary | ICD-10-CM

## 2021-04-14 ENCOUNTER — Other Ambulatory Visit: Payer: Self-pay | Admitting: Family Medicine

## 2021-04-14 DIAGNOSIS — F5101 Primary insomnia: Secondary | ICD-10-CM

## 2021-04-15 ENCOUNTER — Other Ambulatory Visit: Payer: Self-pay | Admitting: Family Medicine

## 2021-04-15 DIAGNOSIS — G8929 Other chronic pain: Secondary | ICD-10-CM

## 2021-04-16 ENCOUNTER — Encounter: Payer: Self-pay | Admitting: Family Medicine

## 2021-04-16 ENCOUNTER — Other Ambulatory Visit: Payer: Self-pay

## 2021-04-16 ENCOUNTER — Ambulatory Visit (INDEPENDENT_AMBULATORY_CARE_PROVIDER_SITE_OTHER): Payer: Medicare Other | Admitting: Family Medicine

## 2021-04-16 DIAGNOSIS — E7800 Pure hypercholesterolemia, unspecified: Secondary | ICD-10-CM | POA: Diagnosis not present

## 2021-04-16 DIAGNOSIS — F5101 Primary insomnia: Secondary | ICD-10-CM

## 2021-04-16 DIAGNOSIS — F3340 Major depressive disorder, recurrent, in remission, unspecified: Secondary | ICD-10-CM | POA: Diagnosis not present

## 2021-04-16 DIAGNOSIS — M545 Low back pain, unspecified: Secondary | ICD-10-CM

## 2021-04-16 DIAGNOSIS — D509 Iron deficiency anemia, unspecified: Secondary | ICD-10-CM | POA: Diagnosis not present

## 2021-04-16 DIAGNOSIS — G8929 Other chronic pain: Secondary | ICD-10-CM

## 2021-04-16 DIAGNOSIS — F419 Anxiety disorder, unspecified: Secondary | ICD-10-CM

## 2021-04-16 DIAGNOSIS — M542 Cervicalgia: Secondary | ICD-10-CM

## 2021-04-16 DIAGNOSIS — Z79899 Other long term (current) drug therapy: Secondary | ICD-10-CM

## 2021-04-16 DIAGNOSIS — K219 Gastro-esophageal reflux disease without esophagitis: Secondary | ICD-10-CM

## 2021-04-16 MED ORDER — OXYCODONE HCL 10 MG PO TABS: 10.0000 mg | ORAL_TABLET | Freq: Three times a day (TID) | ORAL | 0 refills | Status: DC | PRN

## 2021-04-16 MED ORDER — OXYCODONE HCL 10 MG PO TABS: 10.0000 mg | ORAL_TABLET | Freq: Three times a day (TID) | ORAL | 0 refills | Status: AC | PRN

## 2021-04-16 MED ORDER — SEMAGLUTIDE (2 MG/DOSE) 8 MG/3ML ~~LOC~~ SOPN: 2.0000 mg | PEN_INJECTOR | SUBCUTANEOUS | 1 refills | Status: DC

## 2021-04-16 MED ORDER — PANTOPRAZOLE SODIUM 20 MG PO TBEC: 20.0000 mg | DELAYED_RELEASE_TABLET | Freq: Every day | ORAL | 1 refills | Status: DC

## 2021-04-16 NOTE — Patient Instructions (Signed)
Food Choices for Gastroesophageal Reflux Disease, Adult °When you have gastroesophageal reflux disease (GERD), the foods you eat and your eating habits are very important. Choosing the right foods can help ease the discomfort of GERD. Consider working with a dietitian to help you make healthy food choices. °What are tips for following this plan? °Reading food labels °Look for foods that are low in saturated fat. Foods that have less than 5% of daily value (DV) of fat and 0 g of trans fats may help with your symptoms. °Cooking °Cook foods using methods other than frying. This may include baking, steaming, grilling, or broiling. These are all methods that do not need a lot of fat for cooking. °To add flavor, try to use herbs that are low in spice and acidity. °Meal planning ° °Choose healthy foods that are low in fat, such as fruits, vegetables, whole grains, low-fat dairy products, lean meats, fish, and poultry. °Eat frequent, small meals instead of three large meals each day. Eat your meals slowly, in a relaxed setting. Avoid bending over or lying down until 2-3 hours after eating. °Limit high-fat foods such as fatty meats or fried foods. °Limit your intake of fatty foods, such as oils, butter, and shortening. °Avoid the following as told by your health care provider: °Foods that cause symptoms. These may be different for different people. Keep a food diary to keep track of foods that cause symptoms. °Alcohol. °Drinking large amounts of liquid with meals. °Eating meals during the 2-3 hours before bed. °Lifestyle °Maintain a healthy weight. Ask your health care provider what weight is healthy for you. If you need to lose weight, work with your health care provider to do so safely. °Exercise for at least 30 minutes on 5 or more days each week, or as told by your health care provider. °Avoid wearing clothes that fit tightly around your waist and chest. °Do not use any products that contain nicotine or tobacco. These  products include cigarettes, chewing tobacco, and vaping devices, such as e-cigarettes. If you need help quitting, ask your health care provider. °Sleep with the head of your bed raised. Use a wedge under the mattress or blocks under the bed frame to raise the head of the bed. °Chew sugar-free gum after mealtimes. °What foods should I eat? °Eat a healthy, well-balanced diet of fruits, vegetables, whole grains, low-fat dairy products, lean meats, fish, and poultry. Each person is different. Foods that may trigger symptoms in one person may not trigger any symptoms in another person. Work with your health care provider to identify foods that are safe for you. °The items listed above may not be a complete list of recommended foods and beverages. Contact a dietitian for more information. °What foods should I avoid? °Limiting some of these foods may help manage the symptoms of GERD. Everyone is different. Consult a dietitian or your health care provider to help you identify the exact foods to avoid, if any. °Fruits °Any fruits prepared with added fat. Any fruits that cause symptoms. For some people this may include citrus fruits, such as oranges, grapefruit, pineapple, and lemons. °Vegetables °Deep-fried vegetables. French fries. Any vegetables prepared with added fat. Any vegetables that cause symptoms. For some people, this may include tomatoes and tomato products, chili peppers, onions and garlic, and horseradish. °Grains °Pastries or quick breads with added fat. °Meats and other proteins °High-fat meats, such as fatty beef or pork, hot dogs, ribs, ham, sausage, salami, and bacon. Fried meat or protein, including fried   fish and fried chicken. Nuts and nut butters, in large amounts. °Dairy °Whole milk and chocolate milk. Sour cream. Cream. Ice cream. Cream cheese. Milkshakes. °Fats and oils °Butter. Margarine. Shortening. Ghee. °Beverages °Coffee and tea, with or without caffeine. Carbonated beverages. Sodas. Energy  drinks. Fruit juice made with acidic fruits, such as orange or grapefruit. Tomato juice. Alcoholic drinks. °Sweets and desserts °Chocolate and cocoa. Donuts. °Seasonings and condiments °Pepper. Peppermint and spearmint. Added salt. Any condiments, herbs, or seasonings that cause symptoms. For some people, this may include curry, hot sauce, or vinegar-based salad dressings. °The items listed above may not be a complete list of foods and beverages to avoid. Contact a dietitian for more information. °Questions to ask your health care provider °Diet and lifestyle changes are usually the first steps that are taken to manage symptoms of GERD. If diet and lifestyle changes do not improve your symptoms, talk with your health care provider about taking medicines. °Where to find more information °[employer]: aboutgerd.org °Summary °When you have gastroesophageal reflux disease (GERD), food and lifestyle choices may be very helpful in easing the discomfort of GERD. °Eat frequent, small meals instead of three large meals each day. Eat your meals slowly, in a relaxed setting. Avoid bending over or lying down until 2-3 hours after eating. °Limit high-fat foods such as fatty meats or fried foods. °This information is not intended to replace advice given to you by your health care provider. Make sure you discuss any questions you have with your health care provider. °Document Revised: 12/04/2019 Document Reviewed: 12/04/2019 °Elsevier Patient Education © 2022 [employer] ° °
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
## 2021-04-17 LAB — CMP14+EGFR
ALT: 15 IU/L (ref 0–32)
AST: 18 IU/L (ref 0–40)
Albumin/Globulin Ratio: 2 (ref 1.2–2.2)
Albumin: 4.3 g/dL (ref 3.8–4.9)
Alkaline Phosphatase: 81 IU/L (ref 44–121)
BUN/Creatinine Ratio: 9 — ABNORMAL LOW (ref 12–28)
BUN: 9 mg/dL (ref 8–27)
Bilirubin Total: 0.5 mg/dL (ref 0.0–1.2)
CO2: 26 mmol/L (ref 20–29)
Calcium: 10.4 mg/dL — ABNORMAL HIGH (ref 8.7–10.3)
Chloride: 105 mmol/L (ref 96–106)
Creatinine, Ser: 1.04 mg/dL — ABNORMAL HIGH (ref 0.57–1.00)
Globulin, Total: 2.2 g/dL (ref 1.5–4.5)
Glucose: 87 mg/dL (ref 70–99)
Potassium: 4 mmol/L (ref 3.5–5.2)
Sodium: 143 mmol/L (ref 134–144)
Total Protein: 6.5 g/dL (ref 6.0–8.5)
eGFR: 62 mL/min/{1.73_m2} (ref >59)

## 2021-04-17 LAB — ANEMIA PROFILE B
Basophils Absolute: 0.1 10*3/uL (ref 0.0–0.2)
Basos: 1 %
EOS (ABSOLUTE): 0.1 10*3/uL (ref 0.0–0.4)
Eos: 2 %
Ferritin: 119 ng/mL (ref 15–150)
Folate: >20 ng/mL (ref >3.0)
Hematocrit: 40.8 % (ref 34.0–46.6)
Hemoglobin: 13.4 g/dL (ref 11.1–15.9)
Immature Grans (Abs): 0 10*3/uL (ref 0.0–0.1)
Immature Granulocytes: 0 %
Iron Saturation: 22 % (ref 15–55)
Iron: 65 ug/dL (ref 27–159)
Lymphocytes Absolute: 1.1 10*3/uL (ref 0.7–3.1)
Lymphs: 18 %
MCH: 31.5 pg (ref 26.6–33.0)
MCHC: 32.8 g/dL (ref 31.5–35.7)
MCV: 96 fL (ref 79–97)
Monocytes Absolute: 0.3 10*3/uL (ref 0.1–0.9)
Monocytes: 5 %
Neutrophils Absolute: 4.6 10*3/uL (ref 1.4–7.0)
Neutrophils: 74 %
Platelets: 166 10*3/uL (ref 150–450)
RBC: 4.26 x10E6/uL (ref 3.77–5.28)
RDW: 12.5 % (ref 11.7–15.4)
Retic Ct Pct: 1.6 % (ref 0.6–2.6)
Total Iron Binding Capacity: 294 ug/dL (ref 250–450)
UIBC: 229 ug/dL (ref 131–425)
Vitamin B-12: 323 pg/mL (ref 232–1245)
WBC: 6.1 10*3/uL (ref 3.4–10.8)

## 2021-04-17 LAB — LIPID PANEL
Chol/HDL Ratio: 4.3 ratio (ref 0.0–4.4)
Cholesterol, Total: 199 mg/dL (ref 100–199)
HDL: 46 mg/dL (ref >39)
LDL Chol Calc (NIH): 141 mg/dL — ABNORMAL HIGH (ref 0–99)
Triglycerides: 68 mg/dL (ref 0–149)
VLDL Cholesterol Cal: 12 mg/dL (ref 5–40)

## 2021-05-14 ENCOUNTER — Encounter: Payer: Self-pay | Admitting: Family Medicine

## 2021-05-14 ENCOUNTER — Ambulatory Visit (INDEPENDENT_AMBULATORY_CARE_PROVIDER_SITE_OTHER): Payer: Medicare Other | Admitting: Family Medicine

## 2021-05-14 ENCOUNTER — Other Ambulatory Visit: Payer: Self-pay | Admitting: Family Medicine

## 2021-05-14 DIAGNOSIS — F3340 Major depressive disorder, recurrent, in remission, unspecified: Secondary | ICD-10-CM | POA: Diagnosis not present

## 2021-05-14 DIAGNOSIS — F419 Anxiety disorder, unspecified: Secondary | ICD-10-CM | POA: Diagnosis not present

## 2021-05-14 DIAGNOSIS — G8929 Other chronic pain: Secondary | ICD-10-CM

## 2021-05-14 MED ORDER — HYDROXYZINE HCL 10 MG PO TABS: 10.0000 mg | ORAL_TABLET | Freq: Three times a day (TID) | ORAL | 0 refills | Status: DC | PRN

## 2021-05-14 NOTE — Patient Instructions (Signed)
Managing Anxiety, Adult ?After being diagnosed with anxiety, you may be relieved to know why you have felt or behaved a certain way. You may also feel overwhelmed about the treatment ahead and what it will mean for your life. With care and support, you can manage this condition. ?How to manage lifestyle changes ?Managing stress and anxiety ?Stress is your body's reaction to life changes and events, both good and bad. Most stress will last just a few hours, but stress can be ongoing and can lead to more than just stress. Although stress can play a major role in anxiety, it is not the same as anxiety. Stress is usually caused by something external, such as a deadline, test, or competition. Stress normally passes after the triggering event has ended.  ?Anxiety is caused by something internal, such as imagining a terrible outcome or worrying that something will go wrong that will devastate you. Anxiety often does not go away even after the triggering event is over, and it can become long-term (chronic) worry. It is important to understand the differences between stress and anxiety and to manage your stress effectively so that it does not lead to an anxious response. ?Talk with your health care provider or a counselor to learn more about reducing anxiety and stress. He or she may suggest tension reduction techniques, such as: ?Music therapy. Spend time creating or listening to music that you enjoy and that inspires you. ?Mindfulness-based meditation. Practice being aware of your normal breaths while not trying to control your breathing. It can be done while sitting or walking. ?Centering prayer. This involves focusing on a word, phrase, or sacred image that means something to you and brings you peace. ?Deep breathing. To do this, expand your stomach and inhale slowly through your nose. Hold your breath for 3-5 seconds. Then exhale slowly, letting your stomach muscles relax. ?Self-talk. Learn to notice and identify  thought patterns that lead to anxiety reactions and change those patterns to thoughts that feel peaceful. ?Muscle relaxation. Taking time to tense muscles and then relax them. ?Choose a tension reduction technique that fits your lifestyle and personality. These techniques take time and practice. Set aside 5-15 minutes a day to do them. Therapists can offer counseling and training in these techniques. The training to help with anxiety may be covered by some insurance plans. ?Other things you can do to manage stress and anxiety include: ?Keeping a stress diary. This can help you learn what triggers your reaction and then learn ways to manage your response. ?Thinking about how you react to certain situations. You may not be able to control everything, but you can control your response. ?Making time for activities that help you relax and not feeling guilty about spending your time in this way. ?Doing visual imagery. This involves imagining or creating mental pictures to help you relax. ?Practicing yoga. Through yoga poses, you can lower tension and promote relaxation. ? ?Medicines ?Medicines can help ease symptoms. Medicines for anxiety include: ?Antidepressant medicines. These are usually prescribed for long-term daily control. ?Anti-anxiety medicines. These may be added in severe cases, especially when panic attacks occur. ?Medicines will be prescribed by a health care provider. When used together, medicines, psychotherapy, and tension reduction techniques may be the most effective treatment. ?Relationships ?Relationships can play a big part in helping you recover. Try to spend more time connecting with trusted friends and family members. ?Consider going to couples counseling if you have a partner, taking family education classes, or going to family   therapy. ?Therapy can help you and others better understand your condition. ?How to recognize changes in your anxiety ?Everyone responds differently to treatment for  anxiety. Recovery from anxiety happens when symptoms decrease and stop interfering with your daily activities at home or work. This may mean that you will start to: ?Have better concentration and focus. Worry will interfere less in your daily thinking. ?Sleep better. ?Be less irritable. ?Have more energy. ?Have improved memory. ?It is also important to recognize when your condition is getting worse. Contact your health care provider if your symptoms interfere with home or work and you feel like your condition is not improving. ?Follow these instructions at home: ?Activity ?Exercise. Adults should do the following: ?Exercise for at least 150 minutes each week. The exercise should increase your heart rate and make you sweat (moderate-intensity exercise). ?Strengthening exercises at least twice a week. ?Get the right amount and quality of sleep. Most adults need 7-9 hours of sleep each night. ?Lifestyle ? ?Eat a healthy diet that includes plenty of vegetables, fruits, whole grains, low-fat dairy products, and lean protein. ?Do not eat a lot of foods that are high in fats, added sugars, or salt (sodium). ?Make choices that simplify your life. ?Do not use any products that contain nicotine or tobacco. These products include cigarettes, chewing tobacco, and vaping devices, such as e-cigarettes. If you need help quitting, ask your health care provider. ?Avoid caffeine, alcohol, and certain over-the-counter cold medicines. These may make you feel worse. Ask your pharmacist which medicines to avoid. ?General instructions ?Take over-the-counter and prescription medicines only as told by your health care provider. ?Keep all follow-up visits. This is important. ?Where to find support ?You can get help and support from these sources: ?Self-help groups. ?Online and community organizations. ?A trusted spiritual leader. ?Couples counseling. ?Family education classes. ?Family therapy. ?Where to find more information ?You may find  that joining a support group helps you deal with your anxiety. The following sources can help you locate counselors or support groups near you: ?Mental Health America: www.mentalhealthamerica.net ?Anxiety and Depression Association of America (ADAA): www.adaa.org ?National Alliance on Mental Illness (NAMI): www.nami.org ?Contact a health care provider if: ?You have a hard time staying focused or finishing daily tasks. ?You spend many hours a day feeling worried about everyday life. ?You become exhausted by worry. ?You start to have headaches or frequently feel tense. ?You develop chronic nausea or diarrhea. ?Get help right away if: ?You have a racing heart and shortness of breath. ?You have thoughts of hurting yourself or others. ?If you ever feel like you may hurt yourself or others, or have thoughts about taking your own life, get help right away. Go to your nearest emergency department or: ?Call your local emergency services (911 in the [place] ?Call a suicide crisis helpline, such as the National Suicide Prevention Lifeline at 1-800-273-8255 or 988 in the [place] This is open 24 hours a day in the [place] ?Text the Crisis Text Line at 741741 (in the [place] ?Summary ?Taking steps to learn and use tension reduction techniques can help calm you and help prevent triggering an anxiety reaction. ?When used together, medicines, psychotherapy, and tension reduction techniques may be the most effective treatment. ?Family, friends, and partners can play a big part in supporting you. ?This information is not intended to replace advice given to you by your health care provider. Make sure you discuss any questions you have with your health care provider. ?Document Revised: 12/18/2020 Document Reviewed: 09/15/2020 ?Elsevier Patient   Education ? 2022 [employer]. ? ?

## 2021-05-14 NOTE — Progress Notes (Signed)
Established Patient Office Visit  Subjective:  Patient ID: Hannah Hannah [name], female    DOB: 08/18/1960  Age: 60 y.o. MRN: [id number]  CC:  Chief Complaint  Patient Hannah [name] with   Obesity    HPI Hannah Hannah [name] for weight management follow up.   She continues to do well on ozempic 2 mg weekly. She has lost an additional 6 lbs since her visit 4 weeks ago. She continues to eat a well balanced diet with smaller portions. She continues to walk daily for 30-45 minutes.   Wt Readings from Last 3 Encounters:  05/14/21 227 lb (103 kg)  04/16/21 233 lb 8 oz (105.9 kg)  03/17/21 244 lb 4 oz (110.8 kg)    She currently has some house guests that will be staying with her for another few weeks. This has increased her anxiety levels. She wonders if there is anything else for her anxiety that she can take. She currently takes buspar, zoloft, and geodon. She is also on oxycodone for chronic pain.   Depression screen [facility] 2/9 05/14/2021 04/16/2021 03/17/2021  Decreased Interest 0 0 0  Down, Depressed, Hopeless 0 0 1  PHQ - 2 Score 0 0 1  Altered sleeping 0 0 1  Tired, decreased energy _0 Change in appetite 0 0 0  Feeling bad or failure about yourself  0 0 0  Trouble concentrating 0 0 0  Moving slowly or fidgety/restless 0 0 0  Suicidal thoughts 0 0 0  PHQ-9 Score _1 Difficult doing work/chores Not difficult at all Not difficult at all Not difficult at all  Some recent data might be hidden   GAD 7 : Generalized Anxiety Score 05/14/2021 04/16/2021 03/17/2021 01/15/2021  Nervous, Anxious, on Edge 0 0 1 0  Control/stop worrying 0 0 0 0  Worry too much - different things 0 0 0 0  Trouble relaxing 0 0 1 0  Restless 0 0 0 0  Easily annoyed or irritable 1 0 0 0  Afraid - awful might happen 0 0 0 0  Total GAD 7 Score 1 0 2 0  Anxiety Difficulty Not difficult at all Not difficult at all Not difficult at all Not difficult at all        Past Medical History:  Diagnosis Date    Acquired dilation of bile duct    Alcohol abuse    Anxiety    Blood type O+    Chronic neck and back pain    Compulsive skin picking 1998   Depression    Drug abuse ([place])    GERD (gastroesophageal reflux disease)    History of iron deficiency anemia    Hyperlipidemia    Insomnia    Migraines    Osteoporosis 07/08/2017   T-score -2.6 in 2019; -2.2 on 08/05/2018   Vitamin D deficiency     Past Surgical History:  Procedure Laterality Date   ABDOMINAL HYSTERECTOMY  09/07/2006   CESAREAN SECTION  02/16/1990   CHOLECYSTECTOMY  2001   ENDOMETRIAL ABLATION W/ NOVASURE     GASTRIC BYPASS  1998   HEMORRHOID SURGERY  2003   HERNIA REPAIR  2004   KNEE ARTHROSCOPY Right 2008   MOUTH SURGERY     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    Family History  Problem Relation Age of Onset   Anxiety disorder Mother    Depression Mother    Breast cancer Mother    Ovarian cancer Mother  Alcohol abuse Father    Heart disease Father    Kidney disease Brother    Heart disease Brother    Breast cancer Maternal Aunt    Breast cancer Maternal Grandmother    Breast cancer Maternal Grandfather     Social History   Socioeconomic History   Marital status: Single    Spouse name: Not on file   Number of children: 1   Years of education: Not on file   Highest education level: Some college, no degree  Occupational History   Occupation: disabled  Tobacco Use   Smoking status: Former    Types: Cigarettes    Quit date: 1991    Years since quitting: 31.9   Smokeless tobacco: Never  Vaping Use   Vaping Use: Never used  Substance and Sexual Activity   Alcohol use: Not Currently   Drug use: Not Currently   Sexual activity: Not Currently  Other Topics Concern   Not on file  Social History Narrative   Lives alone, one daughter that lives in [place]. Loves to read and go on walks.    Social Determinants of Health   Financial Resource Strain: Not on file  Food Insecurity: Not on file   Transportation Needs: Not on file  Physical Activity: Not on file  Stress: Not on file  Social Connections: Not on file  Intimate Partner Violence: Not on file    Outpatient Medications Prior to Visit  Medication Sig Dispense Refill   B Complex-C (SUPER B COMPLEX PO) Take 1 tablet by mouth daily.     baclofen (LIORESAL) 10 MG tablet Take 1 tablet by mouth three times daily as needed for muscle spasm 90 tablet 0   busPIRone (BUSPAR) 15 MG tablet TAKE 1 TABLET BY MOUTH IN THE MORNING AND AT NOON AND AT BEDTIME 90 tablet 2   celecoxib (CELEBREX) 200 MG capsule Take 1 capsule by mouth once daily 90 capsule 0   Cholecalciferol (VITAMIN D3) 125 MCG (5000 UT) CAPS Take 1 capsule by mouth daily.     CINNAMON PO Take by mouth.     diphenhydrAMINE (BENADRYL) 25 mg capsule Take 50 mg by mouth daily as needed.     famotidine (PEPCID) 20 MG tablet Take 20 mg by mouth daily.     ondansetron (ZOFRAN) 4 MG tablet Take 1 tablet (4 mg total) by mouth every 8 (eight) hours as needed for nausea or vomiting. 20 tablet 0   Oxycodone HCl 10 MG TABS Take 1 tablet (10 mg total) by mouth every 8 (eight) hours as needed. 90 tablet 0   [START ON 05/16/2021] Oxycodone HCl 10 MG TABS Take 1 tablet (10 mg total) by mouth every 8 (eight) hours as needed. 90 tablet 0   [START ON 06/16/2021] Oxycodone HCl 10 MG TABS Take 1 tablet (10 mg total) by mouth every 8 (eight) hours as needed. 90 tablet 0   pantoprazole (PROTONIX) 20 MG tablet Take 1 tablet (20 mg total) by mouth daily. 90 tablet 1   polyethylene glycol powder (GLYCOLAX/MIRALAX) 17 GM/SCOOP powder Take 17 g by mouth daily. 3350 g 1   Semaglutide, 2 MG/DOSE, 8 MG/3ML SOPN Inject 2 mg as directed once a week. 9 mL 1   sertraline (ZOLOFT) 100 MG tablet TAKE 1 TABLET BY MOUTH IN THE MORNING AND AT BEDTIME 180 tablet 0   traZODone (DESYREL) 100 MG tablet TAKE 2 TABLETS BY MOUTH AT BEDTIME 90 tablet 0   ziprasidone (GEODON) 20 MG capsule Take  1 capsule (20 mg total) by  mouth in the morning and at bedtime. 180 capsule 1   No facility-administered medications prior to visit.    Allergies  Allergen Reactions   Demerol [Meperidine] Other (See Comments)    disorientation    Other Other (See Comments)    Disoriented and nausea   Latex Other (See Comments)    blisters   Morphine Palpitations    SLOW HEART RATE     ROS Review of Systems As per HPI.    Objective:    Physical Exam Vitals and nursing note reviewed.  Constitutional:      General: She is not in acute distress.    Appearance: She is obese. She is not ill-appearing, toxic-appearing or diaphoretic.  Cardiovascular:     Rate and Rhythm: Normal rate and regular rhythm.     Heart sounds: Normal heart sounds. No murmur heard. Pulmonary:     Effort: Pulmonary effort is normal. No respiratory distress.     Breath sounds: Normal breath sounds.  Musculoskeletal:     Right lower leg: No edema.     Left lower leg: No edema.  Skin:    General: Skin is warm and dry.  Neurological:     General: No focal deficit present.     Mental Status: She is alert and oriented to person, place, and time.  Psychiatric:        Mood and Affect: Mood normal.        Behavior: Behavior normal.        Thought Content: Thought content normal.        Judgment: Judgment normal.    BP 99/62   Pulse 78   Temp 98.4 F (36.9 C) (Temporal)   Ht $R'5\' 5"'VW$  (1.651 m)   Wt 227 lb (103 kg)   BMI 37.77 kg/m  Wt Readings from Last 3 Encounters:  05/14/21 227 lb (103 kg)  04/16/21 233 lb 8 oz (105.9 kg)  03/17/21 244 lb 4 oz (110.8 kg)     Health Maintenance Due  Topic Date Due   MAMMOGRAM  08/18/2020    There are no preventive care reminders to display for this patient.  Lab Results  Component Value Date   TSH 2.640 01/15/2021   Lab Results  Component Value Date   WBC 6.1 04/16/2021   HGB 13.4 04/16/2021   HCT 40.8 04/16/2021   MCV 96 04/16/2021   PLT 166 04/16/2021   Lab Results  Component Value  Date   NA 143 04/16/2021   K 4.0 04/16/2021   CO2 26 04/16/2021   GLUCOSE 87 04/16/2021   BUN 9 04/16/2021   CREATININE 1.04 (H) 04/16/2021   BILITOT 0.5 04/16/2021   ALKPHOS 81 04/16/2021   AST 18 04/16/2021   ALT 15 04/16/2021   PROT 6.5 04/16/2021   ALBUMIN 4.3 04/16/2021   CALCIUM 10.4 (H) 04/16/2021   EGFR 62 04/16/2021   Lab Results  Component Value Date   CHOL 199 04/16/2021   Lab Results  Component Value Date   HDL 46 04/16/2021   Lab Results  Component Value Date   LDLCALC 141 (H) 04/16/2021   Lab Results  Component Value Date   TRIG 68 04/16/2021   Lab Results  Component Value Date   CHOLHDL 4.3 04/16/2021   Lab Results  Component Value Date   HGBA1C 4.9 10/10/2020      Assessment & Plan:   Ariyan was seen today for obesity.  Diagnoses and  all orders for this visit:  Morbid obesity ([place]) BMI 37 with HLD and HTN. Doing great with diet and exercise along with ozempic. Continue current regimen.   Depression, major, recurrent, in remission ([place]) Well controlled on current regimen.   Anxiety Can had hydroxyzine prn as needed. Discussed not to take other antihistamines with hydroxyzine.  -     hydrOXYzine (ATARAX) 10 MG tablet; Take 1 tablet (10 mg total) by mouth 3 (three) times daily as needed.   Follow-up: Return in about 2 months (around 07/15/2021) for chronic follow up.  The patient indicates understanding of these issues and agrees with the plan.    Gwenlyn Perking, FNP

## 2021-05-16 ENCOUNTER — Other Ambulatory Visit: Payer: Self-pay | Admitting: Family

## 2021-05-16 DIAGNOSIS — F5101 Primary insomnia: Secondary | ICD-10-CM

## 2021-05-29 ENCOUNTER — Other Ambulatory Visit: Payer: Self-pay | Admitting: Family Medicine

## 2021-05-29 DIAGNOSIS — G8929 Other chronic pain: Secondary | ICD-10-CM

## 2021-05-29 DIAGNOSIS — F419 Anxiety disorder, unspecified: Secondary | ICD-10-CM

## 2021-06-10 ENCOUNTER — Other Ambulatory Visit: Payer: Self-pay | Admitting: Family Medicine

## 2021-06-10 DIAGNOSIS — F331 Major depressive disorder, recurrent, moderate: Secondary | ICD-10-CM

## 2021-06-24 ENCOUNTER — Encounter: Payer: Self-pay | Admitting: Family Medicine

## 2021-06-24 ENCOUNTER — Ambulatory Visit (INDEPENDENT_AMBULATORY_CARE_PROVIDER_SITE_OTHER): Payer: Medicare Other | Admitting: Family Medicine

## 2021-06-24 VITALS — BP 102/70 | HR 75 | Temp 98.0°F | Ht 65.0 in | Wt 214.0 lb

## 2021-06-24 DIAGNOSIS — N611 Abscess of the breast and nipple: Secondary | ICD-10-CM | POA: Diagnosis not present

## 2021-06-24 MED ORDER — DOXYCYCLINE HYCLATE 100 MG PO TABS
100.0000 mg | ORAL_TABLET | Freq: Two times a day (BID) | ORAL | 0 refills | Status: AC
Start: 2021-06-24 — End: 2021-07-04

## 2021-06-24 NOTE — Progress Notes (Signed)
Subjective:  Patient ID: Hannah [name]. [place], female    DOB: Dec 26, 1960, 61 y.o.   MRN: [id number]  Patient Care Team: Gwenlyn Perking, FNP as PCP - General (Family Medicine)   Chief Complaint:  Breast Mass (Right breast/Started leaking Sunday milky white, now gunky blood)   HPI: Hannah [name] is a 61 y.o. female presenting on 06/24/2021 for Breast Mass (Right breast/Started leaking Sunday milky white, now gunky blood)   Pt presents today for evaluation of a lesion to right breast. States on Friday she noticed some swelling and redness. States by Monday the lesion had started draining. States area is tender to touch, red, and slightly swollen. No fever, chills, weakness, weight changes, night sweats, or appetite changes.    Relevant past medical, surgical, family, and social history reviewed and updated as indicated.  Allergies and medications reviewed and updated. Data reviewed: Chart in Epic.   Past Medical History:  Diagnosis Date   Acquired dilation of bile duct    Alcohol abuse    Anxiety    Blood type O+    Chronic neck and back pain    Compulsive skin picking 1998   Depression    Drug abuse ([place])    GERD (gastroesophageal reflux disease)    History of iron deficiency anemia    Hyperlipidemia    Insomnia    Migraines    Osteoporosis 07/08/2017   T-score -2.6 in 2019; -2.2 on 08/05/2018   Vitamin D deficiency     Past Surgical History:  Procedure Laterality Date   ABDOMINAL HYSTERECTOMY  09/07/2006   CESAREAN SECTION  02/16/1990   CHOLECYSTECTOMY  2001   ENDOMETRIAL ABLATION W/ Agar     GASTRIC BYPASS  1998   HEMORRHOID SURGERY  2003   HERNIA REPAIR  2004   KNEE ARTHROSCOPY Right 2008   MOUTH SURGERY     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    Social History   Socioeconomic History   Marital status: Single    Spouse name: Not on file   Number of children: 1   Years of education: Not on file   Highest education level: Some college, no degree   Occupational History   Occupation: disabled  Tobacco Use   Smoking status: Former    Types: Cigarettes    Quit date: 1991    Years since quitting: 32.0   Smokeless tobacco: Never  Vaping Use   Vaping Use: Never used  Substance and Sexual Activity   Alcohol use: Not Currently   Drug use: Not Currently   Sexual activity: Not Currently  Other Topics Concern   Not on file  Social History Narrative   Lives alone, one daughter that lives in [place]. Loves to read and go on walks.    Social Determinants of Health   Financial Resource Strain: Not on file  Food Insecurity: Not on file  Transportation Needs: Not on file  Physical Activity: Not on file  Stress: Not on file  Social Connections: Not on file  Intimate Partner Violence: Not on file    Outpatient Encounter Medications as of 06/24/2021  Medication Sig   B Complex-C (SUPER B COMPLEX PO) Take 1 tablet by mouth daily.   baclofen (LIORESAL) 10 MG tablet Take 1 tablet by mouth three times daily as needed for muscle spasm   busPIRone (BUSPAR) 15 MG tablet TAKE 1 TABLET BY MOUTH IN THE MORNING AND 1 AT NOON AND 1 AT BEDTIME   celecoxib (CELEBREX)  200 MG capsule Take 1 capsule by mouth once daily   Cholecalciferol (VITAMIN D3) 125 MCG (5000 UT) CAPS Take 1 capsule by mouth daily.   CINNAMON PO Take by mouth.   diphenhydrAMINE (BENADRYL) 25 mg capsule Take 50 mg by mouth daily as needed.   doxycycline (VIBRA-TABS) 100 MG tablet Take 1 tablet (100 mg total) by mouth 2 (two) times daily for 10 days. 1 po bid   famotidine (PEPCID) 20 MG tablet Take 20 mg by mouth daily.   hydrOXYzine (ATARAX) 10 MG tablet Take 1 tablet (10 mg total) by mouth 3 (three) times daily as needed.   ondansetron (ZOFRAN) 4 MG tablet Take 1 tablet (4 mg total) by mouth every 8 (eight) hours as needed for nausea or vomiting.   Oxycodone HCl 10 MG TABS Take 1 tablet (10 mg total) by mouth every 8 (eight) hours as needed.   pantoprazole (PROTONIX) 20 MG tablet Take  1 tablet (20 mg total) by mouth daily.   polyethylene glycol powder (GLYCOLAX/MIRALAX) 17 GM/SCOOP powder Take 17 g by mouth daily.   Semaglutide, 2 MG/DOSE, 8 MG/3ML SOPN Inject 2 mg as directed once a week.   sertraline (ZOLOFT) 100 MG tablet TAKE 1 TABLET BY MOUTH IN THE MORNING AND AT BEDTIME   traZODone (DESYREL) 100 MG tablet TAKE 2 TABLETS BY MOUTH AT BEDTIME   ziprasidone (GEODON) 20 MG capsule Take 1 capsule (20 mg total) by mouth in the morning and at bedtime.   No facility-administered encounter medications on file as of 06/24/2021.    Allergies  Allergen Reactions   Demerol [Meperidine] Other (See Comments)    disorientation    Other Other (See Comments)    Disoriented and nausea   Latex Other (See Comments)    blisters   Morphine Palpitations    SLOW HEART RATE     Review of Systems  Constitutional:  Negative for activity change, appetite change, chills, diaphoresis, fatigue, fever and unexpected weight change.  HENT: Negative.    Eyes: Negative.   Respiratory:  Negative for cough, chest tightness and shortness of breath.   Cardiovascular:  Negative for chest pain, palpitations and leg swelling.  Gastrointestinal:  Negative for abdominal pain, blood in stool, constipation, diarrhea, nausea and vomiting.  Endocrine: Negative.   Genitourinary:  Negative for dysuria, frequency and urgency.  Musculoskeletal:  Negative for arthralgias and myalgias.  Skin:  Positive for color change and wound. Negative for pallor and rash.  Allergic/Immunologic: Negative.   Neurological:  Negative for dizziness and headaches.  Hematological: Negative.   Psychiatric/Behavioral:  Negative for confusion, hallucinations, sleep disturbance and suicidal ideas.   All other systems reviewed and are negative.      Objective:  BP 102/70    Pulse 75    Temp 98 F (36.7 C)    Ht _0  (1.651 m)    Wt 214 lb (97.1 kg)    SpO2 98%    BMI 35.61 kg/m    Wt Readings from Last 3 Encounters:   06/24/21 214 lb (97.1 kg)  05/14/21 227 lb (103 kg)  04/16/21 233 lb 8 oz (105.9 kg)    Physical Exam Vitals and nursing note reviewed.  Constitutional:      General: She is not in acute distress.    Appearance: Normal appearance. She is well-developed and well-groomed. She is obese. She is not ill-appearing, toxic-appearing or diaphoretic.  HENT:     Head: Normocephalic and atraumatic.     Jaw: There  is normal jaw occlusion.     Right Ear: Hearing normal.     Left Ear: Hearing normal.     Nose: Nose normal.     Mouth/Throat:     Lips: Pink.     Mouth: Mucous membranes are moist.     Pharynx: Oropharynx is clear. Uvula midline.  Eyes:     General: Lids are normal.     Conjunctiva/sclera: Conjunctivae normal.     Pupils: Pupils are equal, round, and reactive to light.  Neck:     Thyroid: No thyroid mass, thyromegaly or thyroid tenderness.     Vascular: No carotid bruit or JVD.     Trachea: Trachea and phonation normal.  Cardiovascular:     Rate and Rhythm: Normal rate and regular rhythm.     Chest Wall: PMI is not displaced.     Pulses: Normal pulses.     Heart sounds: Normal heart sounds. No murmur heard.   No friction rub. No gallop.  Pulmonary:     Effort: Pulmonary effort is normal. No respiratory distress.     Breath sounds: Normal breath sounds.  Chest:     Chest wall: No mass, lacerations, deformity, swelling, tenderness, crepitus or edema. There is no dullness to percussion.  Breasts:    Tanner Score is 5.     Breasts are symmetrical.     Right: Swelling, skin change and tenderness present. No bleeding, inverted nipple, mass or nipple discharge.     Left: No swelling, bleeding, inverted nipple, mass, nipple discharge, skin change or tenderness.       Comments: Right lateral breast abscess Abdominal:     General: There is no abdominal bruit.     Palpations: There is no hepatomegaly or splenomegaly.  Musculoskeletal:        General: Normal range of motion.      Cervical back: Normal range of motion and neck supple.     Right lower leg: No edema.     Left lower leg: No edema.  Lymphadenopathy:     Cervical: No cervical adenopathy.  Skin:    General: Skin is warm and dry.     Capillary Refill: Capillary refill takes less than 2 seconds.     Coloration: Skin is not cyanotic, jaundiced or pale.     Findings: Abscess (right lateral breast) present. No rash.  Neurological:     General: No focal deficit present.     Mental Status: She is alert and oriented to person, place, and time.     Sensory: Sensation is intact.     Motor: Motor function is intact.     Coordination: Coordination is intact.     Gait: Gait is intact.     Deep Tendon Reflexes: Reflexes are normal and symmetric.  Psychiatric:        Attention and Perception: Attention and perception normal.        Mood and Affect: Mood and affect normal.        Speech: Speech normal.        Behavior: Behavior normal. Behavior is cooperative.        Thought Content: Thought content normal.        Cognition and Memory: Cognition and memory normal.        Judgment: Judgment normal.    Results for orders placed or performed in visit on 04/16/21  Anemia Profile B  Result Value Ref Range   Total Iron Binding Capacity 294 250 - 450 ug/dL  UIBC 229 131 - 425 ug/dL   Iron 65 27 - 159 ug/dL   Iron Saturation 22 15 - 55 %   Ferritin 119 15 - 150 ng/mL   Vitamin B-12 323 232 - 1,245 pg/mL   Folate >20.0 >3.0 ng/mL   WBC 6.1 3.4 - 10.8 x10E3/uL   RBC 4.26 3.77 - 5.28 x10E6/uL   Hemoglobin 13.4 11.1 - 15.9 g/dL   Hematocrit 40.8 34.0 - 46.6 %   MCV 96 79 - 97 fL   MCH 31.5 26.6 - 33.0 pg   MCHC 32.8 31.5 - 35.7 g/dL   RDW 12.5 11.7 - 15.4 %   Platelets 166 150 - 450 x10E3/uL   Neutrophils 74 Not Estab. %   Lymphs 18 Not Estab. %   Monocytes 5 Not Estab. %   Eos 2 Not Estab. %   Basos 1 Not Estab. %   Neutrophils Absolute 4.6 1.4 - 7.0 x10E3/uL   Lymphocytes Absolute 1.1 0.7 - 3.1  x10E3/uL   Monocytes Absolute 0.3 0.1 - 0.9 x10E3/uL   EOS (ABSOLUTE) 0.1 0.0 - 0.4 x10E3/uL   Basophils Absolute 0.1 0.0 - 0.2 x10E3/uL   Immature Granulocytes 0 Not Estab. %   Immature Grans (Abs) 0.0 0.0 - 0.1 x10E3/uL   Retic Ct Pct 1.6 0.6 - 2.6 %  CMP14+EGFR  Result Value Ref Range   Glucose 87 70 - 99 mg/dL   BUN 9 8 - 27 mg/dL   Creatinine, Ser 1.04 (H) 0.57 - 1.00 mg/dL   eGFR 62 >59 mL/min/1.73   BUN/Creatinine Ratio 9 (L) 12 - 28   Sodium 143 134 - 144 mmol/L   Potassium 4.0 3.5 - 5.2 mmol/L   Chloride 105 96 - 106 mmol/L   CO2 26 20 - 29 mmol/L   Calcium 10.4 (H) 8.7 - 10.3 mg/dL   Total Protein 6.5 6.0 - 8.5 g/dL   Albumin 4.3 3.8 - 4.9 g/dL   Globulin, Total 2.2 1.5 - 4.5 g/dL   Albumin/Globulin Ratio 2.0 1.2 - 2.2   Bilirubin Total 0.5 0.0 - 1.2 mg/dL   Alkaline Phosphatase 81 44 - 121 IU/L   AST 18 0 - 40 IU/L   ALT 15 0 - 32 IU/L  Lipid panel  Result Value Ref Range   Cholesterol, Total 199 100 - 199 mg/dL   Triglycerides 68 0 - 149 mg/dL   HDL 46 >39 mg/dL   VLDL Cholesterol Cal 12 5 - 40 mg/dL   LDL Chol Calc (NIH) 141 (H) 0 - 99 mg/dL   Chol/HDL Ratio 4.3 0.0 - 4.4 ratio       Pertinent labs & imaging results that were available during my care of the patient were reviewed by me and considered in my medical decision making.  Assessment & Plan:  Hannah [name] was seen today for breast mass.  Diagnoses and all orders for this visit:  Breast abscess Abscess to right lateral breast, no other noted skin changes or areola changes. Symptomatic care discussed in detail. Will treat with doxycycline. Follow up in 2 weeks or sooner if not improving. Will obtain diagnostic testing if not improving. If resolved, will obtain screening mammogram as pt is past due.  -     doxycycline (VIBRA-TABS) 100 MG tablet; Take 1 tablet (100 mg total) by mouth 2 (two) times daily for 10 days. 1 po bid     Continue all other maintenance medications.  Follow up plan: Return in about  2 weeks (around 07/08/2021),  or if symptoms worsen or fail to improve, for abscess.   Continue healthy lifestyle choices, including diet (rich in fruits, vegetables, and lean proteins, and low in salt and simple carbohydrates) and exercise (at least 30 minutes of moderate physical activity daily).  Educational handout given for skin abscess  The above assessment and management plan was discussed with the patient. The patient verbalized understanding of and has agreed to the management plan. Patient is aware to call the clinic if they develop any new symptoms or if symptoms persist or worsen. Patient is aware when to return to the clinic for a follow-up visit. Patient educated on when it is appropriate to go to the emergency department.   Monia Pouch, FNP-C [facility] [phone]

## 2021-07-15 ENCOUNTER — Telehealth: Payer: Self-pay | Admitting: Family Medicine

## 2021-07-15 ENCOUNTER — Encounter: Payer: Self-pay | Admitting: Family Medicine

## 2021-07-15 ENCOUNTER — Ambulatory Visit (INDEPENDENT_AMBULATORY_CARE_PROVIDER_SITE_OTHER): Payer: Medicare Other | Admitting: Family Medicine

## 2021-07-15 DIAGNOSIS — E7800 Pure hypercholesterolemia, unspecified: Secondary | ICD-10-CM | POA: Diagnosis not present

## 2021-07-15 DIAGNOSIS — Z7689 Persons encountering health services in other specified circumstances: Secondary | ICD-10-CM | POA: Diagnosis not present

## 2021-07-15 DIAGNOSIS — F419 Anxiety disorder, unspecified: Secondary | ICD-10-CM

## 2021-07-15 DIAGNOSIS — Z79899 Other long term (current) drug therapy: Secondary | ICD-10-CM | POA: Diagnosis not present

## 2021-07-15 DIAGNOSIS — F331 Major depressive disorder, recurrent, moderate: Secondary | ICD-10-CM

## 2021-07-15 DIAGNOSIS — M545 Low back pain, unspecified: Secondary | ICD-10-CM | POA: Diagnosis not present

## 2021-07-15 DIAGNOSIS — D508 Other iron deficiency anemias: Secondary | ICD-10-CM | POA: Diagnosis not present

## 2021-07-15 DIAGNOSIS — E162 Hypoglycemia, unspecified: Secondary | ICD-10-CM | POA: Diagnosis not present

## 2021-07-15 DIAGNOSIS — G8929 Other chronic pain: Secondary | ICD-10-CM | POA: Diagnosis not present

## 2021-07-15 DIAGNOSIS — N611 Abscess of the breast and nipple: Secondary | ICD-10-CM

## 2021-07-15 LAB — BAYER DCA HB A1C WAIVED: HB A1C (BAYER DCA - WAIVED): 4.3 % — ABNORMAL LOW (ref 4.8–5.6)

## 2021-07-15 MED ORDER — DOXYCYCLINE HYCLATE 100 MG PO TABS: 100.0000 mg | ORAL_TABLET | Freq: Two times a day (BID) | ORAL | 0 refills | Status: AC

## 2021-07-15 MED ORDER — ZIPRASIDONE HCL 20 MG PO CAPS: 20.0000 mg | ORAL_CAPSULE | Freq: Two times a day (BID) | ORAL | 1 refills | Status: DC

## 2021-07-15 MED ORDER — SULFAMETHOXAZOLE-TRIMETHOPRIM 800-160 MG PO TABS: 1.0000 | ORAL_TABLET | Freq: Two times a day (BID) | ORAL | 0 refills | Status: DC

## 2021-07-15 MED ORDER — NALOXONE HCL 4 MG/0.1ML NA LIQD
NASAL | 1 refills | Status: AC
Start: 2021-07-15 — End: ?

## 2021-07-15 MED ORDER — SEMAGLUTIDE (2 MG/DOSE) 8 MG/3ML ~~LOC~~ SOPN: 2.0000 mg | PEN_INJECTOR | SUBCUTANEOUS | 1 refills | Status: DC

## 2021-07-15 MED ORDER — OXYCODONE HCL 10 MG PO TABS: 10.0000 mg | ORAL_TABLET | Freq: Three times a day (TID) | ORAL | 0 refills | Status: DC | PRN

## 2021-07-15 NOTE — Telephone Encounter (Signed)
I will send in doxy. Thanks.

## 2021-07-15 NOTE — Telephone Encounter (Signed)
Pharmacist at [facility] calling because patient has sulfa allergy and Bactrim was prescribed.  This allergy was not listed on our records but spoke with patient and she is allergic.  Please send in new antibiotic.  I've added this to her allergy list.

## 2021-07-15 NOTE — Addendum Note (Signed)
Addended by: Sonny Masters on: 07/15/2021 09:58 AM   Modules accepted: Orders

## 2021-07-15 NOTE — Progress Notes (Addendum)
Subjective:  Patient ID: Hannah [name]. Devora, female    DOB: 02/24/1961, 61 y.o.   MRN: [id number]  Patient Care Team: Gwenlyn Perking, FNP as PCP - General (Family Medicine)   Chief Complaint:  Medical Management of Chronic Issues   HPI: Hannah [name] is a 61 y.o. female presenting on 07/15/2021 for Medical Management of Chronic Issues    1. Morbid obesity ([place]) 2. Encounter for weight management Has been trying to stay active and watch diet. Currently on Ozempic and tolerating well, denies adverse side effects. Has lost 19lbs since last visit.   3. Chronic bilateral low back pain without sciatica Pain assessment: Cause of pain- old trauma from [place] accident Pain location- lower back and neck Pain on scale of 1-10- 6/10 without medications, 0/10 with medications Frequency- daily What increases pain-activity like house work or lifting objects What makes pain Better-resting and medicatoin Effects on ADL - minimal to none Any change in general medical condition-none  Current opioids rx- Oxycodone 10 mg TID PRN # meds rx- 90 Effectiveness of current meds-good Adverse reactions from pain meds-none Morphine equivalent- 43.5 MME  Pill count performed-No Last drug screen - 01/12/2020 - updated today Urine drug screen today- Yes Was the [place] reviewed- yes  If yes were their any concerning findings? - no  Overdose risk: high Opioid Risk  01/18/2020  Alcohol 3  Illegal Drugs 0  Rx Drugs 0  Alcohol 3  Illegal Drugs 0  Rx Drugs 0  Age between 16-45 years  0  History of Preadolescent Sexual Abuse 3  Psychological Disease 0  Depression 1  Opioid Risk Tool Scoring 10  Opioid Risk Interpretation High Risk    4. Moderate episode of recurrent major depressive disorder ([place]) 5. Anxiety Doing very well on current regimen without adverse side effects.  Depression screen [facility] 2/9 07/15/2021 06/24/2021 05/14/2021 04/16/2021 03/17/2021  Decreased Interest 0 0 0 0 0  Down,  Depressed, Hopeless 0 1 0 0 1  PHQ - 2 Score 0 1 0 0 1  Altered sleeping 0 0 0 0 1  Tired, decreased energy _0 Change in appetite 0 1 0 0 0  Feeling bad or failure about yourself  0 0 0 0 0  Trouble concentrating 0 0 0 0 0  Moving slowly or fidgety/restless 0 0 0 0 0  Suicidal thoughts 0 0 0 0 0  PHQ-9 Score _1 Difficult doing work/chores Not difficult at all Somewhat difficult Not difficult at all Not difficult at all Not difficult at all  Some recent data might be hidden   GAD 7 : Generalized Anxiety Score 07/15/2021 06/24/2021 05/14/2021 04/16/2021  Nervous, Anxious, on Edge 0 0 0 0  Control/stop worrying 1 1 0 0  Worry too much - different things 0 1 0 0  Trouble relaxing 0 0 0 0  Restless 0 0 0 0  Easily annoyed or irritable 0 0 1 0  Afraid - awful might happen 0 0 0 0  Total GAD 7 Score _2 0  Anxiety Difficulty Not difficult at all Somewhat difficult Not difficult at all Not difficult at all      6. Pure hypercholesterolemia Diet and exercise controlled. Tries to avoid fried, greasy, fatty, and fast foods.   7. Breast abscess Was treated with antibiotics and states has almost completely resolved. No fever, chills, weakness, confusion, or decreased urine output. Completed full course  of antibiotics.  8. Other iron deficiency anemia Not on iron repletion, denies abnormal bleeding or bruising. No chest pain, shortness of breath, dizziness, or fatigue.     Relevant past medical, surgical, family, and social history reviewed and updated as indicated.  Allergies and medications reviewed and updated. Data reviewed: Chart in Epic.   Past Medical History:  Diagnosis Date   Acquired dilation of bile duct    Alcohol abuse    Anxiety    Blood type O+    Chronic neck and back pain    Compulsive skin picking 1998   Depression    Drug abuse ([place])    GERD (gastroesophageal reflux disease)    History of iron deficiency anemia    Hyperlipidemia    Insomnia     Migraines    Osteoporosis 07/08/2017   T-score -2.6 in 2019; -2.2 on 08/05/2018   Vitamin D deficiency     Past Surgical History:  Procedure Laterality Date   ABDOMINAL HYSTERECTOMY  09/07/2006   CESAREAN SECTION  02/16/1990   CHOLECYSTECTOMY  2001   ENDOMETRIAL ABLATION W/ [place]     GASTRIC BYPASS  1998   HEMORRHOID SURGERY  2003   HERNIA REPAIR  2004   KNEE ARTHROSCOPY Right 2008   MOUTH SURGERY     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    Social History   Socioeconomic History   Marital status: Single    Spouse name: Not on file   Number of children: 1   Years of education: Not on file   Highest education level: Some college, no degree  Occupational History   Occupation: disabled  Tobacco Use   Smoking status: Former    Types: Cigarettes    Quit date: 1991    Years since quitting: 32.1   Smokeless tobacco: Never  Vaping Use   Vaping Use: Never used  Substance and Sexual Activity   Alcohol use: Not Currently   Drug use: Not Currently   Sexual activity: Not Currently  Other Topics Concern   Not on file  Social History Narrative   Lives alone, one daughter that lives in [place]. Loves to read and go on walks.    Social Determinants of Health   Financial Resource Strain: Not on file  Food Insecurity: Not on file  Transportation Needs: Not on file  Physical Activity: Not on file  Stress: Not on file  Social Connections: Not on file  Intimate Partner Violence: Not on file    Outpatient Encounter Medications as of 07/15/2021  Medication Sig   B Complex-C (SUPER B COMPLEX PO) Take 1 tablet by mouth daily.   baclofen (LIORESAL) 10 MG tablet Take 1 tablet by mouth three times daily as needed for muscle spasm   busPIRone (BUSPAR) 15 MG tablet TAKE 1 TABLET BY MOUTH IN THE MORNING AND 1 AT NOON AND 1 AT BEDTIME   celecoxib (CELEBREX) 200 MG capsule Take 1 capsule by mouth once daily   Cholecalciferol (VITAMIN D3) 125 MCG (5000 UT) CAPS Take 1 capsule by mouth  daily.   CINNAMON PO Take by mouth.   diphenhydrAMINE (BENADRYL) 25 mg capsule Take 50 mg by mouth daily as needed.   famotidine (PEPCID) 20 MG tablet Take 20 mg by mouth daily.   pantoprazole (PROTONIX) 20 MG tablet Take 1 tablet (20 mg total) by mouth daily.   polyethylene glycol powder (GLYCOLAX/MIRALAX) 17 GM/SCOOP powder Take 17 g by mouth daily.   sertraline (ZOLOFT) 100 MG tablet TAKE 1  TABLET BY MOUTH IN THE MORNING AND AT BEDTIME   sulfamethoxazole-trimethoprim (BACTRIM DS) 800-160 MG tablet Take 1 tablet by mouth 2 (two) times daily for 7 days.   traZODone (DESYREL) 100 MG tablet TAKE 2 TABLETS BY MOUTH AT BEDTIME   [DISCONTINUED] Oxycodone HCl 10 MG TABS Take 1 tablet (10 mg total) by mouth every 8 (eight) hours as needed.   [DISCONTINUED] Semaglutide, 2 MG/DOSE, 8 MG/3ML SOPN Inject 2 mg as directed once a week.   [DISCONTINUED] ziprasidone (GEODON) 20 MG capsule Take 1 capsule (20 mg total) by mouth in the morning and at bedtime.   Oxycodone HCl 10 MG TABS Take 1 tablet (10 mg total) by mouth every 8 (eight) hours as needed.   Semaglutide, 2 MG/DOSE, 8 MG/3ML SOPN Inject 2 mg as directed once a week.   ziprasidone (GEODON) 20 MG capsule Take 1 capsule (20 mg total) by mouth in the morning and at bedtime.   [DISCONTINUED] hydrOXYzine (ATARAX) 10 MG tablet Take 1 tablet (10 mg total) by mouth 3 (three) times daily as needed.   [DISCONTINUED] ondansetron (ZOFRAN) 4 MG tablet Take 1 tablet (4 mg total) by mouth every 8 (eight) hours as needed for nausea or vomiting.   No facility-administered encounter medications on file as of 07/15/2021.    Allergies  Allergen Reactions   Demerol [Meperidine] Other (See Comments)    disorientation    Other Other (See Comments)    Disoriented and nausea   Latex Other (See Comments)    blisters   Morphine Palpitations    SLOW HEART RATE     Review of Systems  Constitutional:  Positive for fatigue. Negative for activity change, appetite  change, chills, diaphoresis, fever and unexpected weight change.  HENT: Negative.    Eyes: Negative.  Negative for photophobia and visual disturbance.  Respiratory:  Negative for cough, chest tightness and shortness of breath.   Cardiovascular:  Negative for chest pain, palpitations and leg swelling.  Gastrointestinal:  Negative for abdominal pain, blood in stool, constipation, diarrhea, nausea and vomiting.  Endocrine: Negative.   Genitourinary:  Negative for decreased urine volume, difficulty urinating, dysuria, frequency and urgency.  Musculoskeletal:  Negative for arthralgias and myalgias.  Skin:  Positive for wound. Negative for color change, pallor and rash.  Allergic/Immunologic: Negative.   Neurological:  Negative for dizziness, tremors, seizures, syncope, facial asymmetry, speech difficulty, weakness, light-headedness, numbness and headaches.  Hematological: Negative.   Psychiatric/Behavioral:  Negative for agitation, behavioral problems, confusion, decreased concentration, dysphoric mood, hallucinations, self-injury, sleep disturbance and suicidal ideas. The patient is not nervous/anxious and is not hyperactive.   All other systems reviewed and are negative.      Objective:  BP 99/62    Pulse 96    Temp 98.3 F (36.8 C) (Temporal)    Ht _0  (1.651 m)    Wt 208 lb 2 oz (94.4 kg)    BMI 34.63 kg/m    Wt Readings from Last 3 Encounters:  07/15/21 208 lb 2 oz (94.4 kg)  06/24/21 214 lb (97.1 kg)  05/14/21 227 lb (103 kg)    Physical Exam Vitals and nursing note reviewed.  Constitutional:      General: She is not in acute distress.    Appearance: Normal appearance. She is well-developed and well-groomed. She is obese. She is not ill-appearing, toxic-appearing or diaphoretic.  HENT:     Head: Normocephalic and atraumatic.     Jaw: There is normal jaw occlusion.  Right Ear: Hearing normal.     Left Ear: Hearing normal.     Nose: Nose normal.     Mouth/Throat:      Lips: Pink.     Mouth: Mucous membranes are moist.     Pharynx: Oropharynx is clear. Uvula midline.  Eyes:     General: Lids are normal.     Extraocular Movements: Extraocular movements intact.     Conjunctiva/sclera: Conjunctivae normal.     Pupils: Pupils are equal, round, and reactive to light.  Neck:     Thyroid: No thyroid mass, thyromegaly or thyroid tenderness.     Vascular: No carotid bruit or JVD.     Trachea: Trachea and phonation normal.  Cardiovascular:     Rate and Rhythm: Normal rate and regular rhythm.     Chest Wall: PMI is not displaced.     Pulses: Normal pulses.     Heart sounds: Normal heart sounds. No murmur heard.   No friction rub. No gallop.  Pulmonary:     Effort: Pulmonary effort is normal. No respiratory distress.     Breath sounds: Normal breath sounds. No wheezing.  Abdominal:     General: Bowel sounds are normal. There is no distension or abdominal bruit.     Palpations: Abdomen is soft. There is no hepatomegaly or splenomegaly.     Tenderness: There is no abdominal tenderness. There is no right CVA tenderness or left CVA tenderness.     Hernia: No hernia is present.  Musculoskeletal:     Cervical back: Normal, normal range of motion and neck supple.     Thoracic back: Tenderness present. No swelling, edema, deformity, signs of trauma, lacerations, spasms or bony tenderness. Normal range of motion. No scoliosis.     Lumbar back: Tenderness present. No swelling, edema, deformity, signs of trauma, lacerations, spasms or bony tenderness. Decreased range of motion. Negative right straight leg raise test and negative left straight leg raise test. No scoliosis.     Right hip: Normal.     Left hip: Normal.     Right lower leg: No edema.     Left lower leg: No edema.  Lymphadenopathy:     Cervical: No cervical adenopathy.  Skin:    General: Skin is warm and dry.     Capillary Refill: Capillary refill takes less than 2 seconds.     Coloration: Skin is not  cyanotic, jaundiced or pale.     Findings: Abscess (right lateral breast, healing well, small amount of purulent discharge, no erythema or increased warmth) present. No rash.  Neurological:     General: No focal deficit present.     Mental Status: She is alert and oriented to person, place, and time.     Sensory: Sensation is intact.     Motor: Motor function is intact.     Coordination: Coordination is intact.     Gait: Gait is intact.     Deep Tendon Reflexes: Reflexes are normal and symmetric.  Psychiatric:        Attention and Perception: Attention and perception normal.        Mood and Affect: Mood and affect normal.        Speech: Speech normal.        Behavior: Behavior normal. Behavior is cooperative.        Thought Content: Thought content normal.        Cognition and Memory: Cognition and memory normal.  Judgment: Judgment normal.    Results for orders placed or performed in visit on 04/16/21  Anemia Profile B  Result Value Ref Range   Total Iron Binding Capacity 294 250 - 450 ug/dL   UIBC 229 131 - 425 ug/dL   Iron 65 27 - 159 ug/dL   Iron Saturation 22 15 - 55 %   Ferritin 119 15 - 150 ng/mL   Vitamin B-12 323 232 - 1,245 pg/mL   Folate >20.0 >3.0 ng/mL   WBC 6.1 3.4 - 10.8 x10E3/uL   RBC 4.26 3.77 - 5.28 x10E6/uL   Hemoglobin 13.4 11.1 - 15.9 g/dL   Hematocrit 40.8 34.0 - 46.6 %   MCV 96 79 - 97 fL   MCH 31.5 26.6 - 33.0 pg   MCHC 32.8 31.5 - 35.7 g/dL   RDW 12.5 11.7 - 15.4 %   Platelets 166 150 - 450 x10E3/uL   Neutrophils 74 Not Estab. %   Lymphs 18 Not Estab. %   Monocytes 5 Not Estab. %   Eos 2 Not Estab. %   Basos 1 Not Estab. %   Neutrophils Absolute 4.6 1.4 - 7.0 x10E3/uL   Lymphocytes Absolute 1.1 0.7 - 3.1 x10E3/uL   Monocytes Absolute 0.3 0.1 - 0.9 x10E3/uL   EOS (ABSOLUTE) 0.1 0.0 - 0.4 x10E3/uL   Basophils Absolute 0.1 0.0 - 0.2 x10E3/uL   Immature Granulocytes 0 Not Estab. %   Immature Grans (Abs) 0.0 0.0 - 0.1 x10E3/uL   Retic Ct  Pct 1.6 0.6 - 2.6 %  CMP14+EGFR  Result Value Ref Range   Glucose 87 70 - 99 mg/dL   BUN 9 8 - 27 mg/dL   Creatinine, Ser 1.04 (H) 0.57 - 1.00 mg/dL   eGFR 62 >59 mL/min/1.73   BUN/Creatinine Ratio 9 (L) 12 - 28   Sodium 143 134 - 144 mmol/L   Potassium 4.0 3.5 - 5.2 mmol/L   Chloride 105 96 - 106 mmol/L   CO2 26 20 - 29 mmol/L   Calcium 10.4 (H) 8.7 - 10.3 mg/dL   Total Protein 6.5 6.0 - 8.5 g/dL   Albumin 4.3 3.8 - 4.9 g/dL   Globulin, Total 2.2 1.5 - 4.5 g/dL   Albumin/Globulin Ratio 2.0 1.2 - 2.2   Bilirubin Total 0.5 0.0 - 1.2 mg/dL   Alkaline Phosphatase 81 44 - 121 IU/L   AST 18 0 - 40 IU/L   ALT 15 0 - 32 IU/L  Lipid panel  Result Value Ref Range   Cholesterol, Total 199 100 - 199 mg/dL   Triglycerides 68 0 - 149 mg/dL   HDL 46 >39 mg/dL   VLDL Cholesterol Cal 12 5 - 40 mg/dL   LDL Chol Calc (NIH) 141 (H) 0 - 99 mg/dL   Chol/HDL Ratio 4.3 0.0 - 4.4 ratio     EKG: SR 80, PR 168 ms, QT 370 ms, nonspecific T wave changes, no acute ST-T changes, incomplete BBB, no ectopy, no prior EKG for comparison. Monia Pouch, FNP-C  Pertinent labs & imaging results that were available during my care of the patient were reviewed by me and considered in my medical decision making.  Assessment & Plan:  Hannah [name] was seen today for medical management of chronic issues.  Diagnoses and all orders for this visit:  Morbid obesity ([place]) Encounter for weight management Has been doing well on Ozempic, will continue. Diet and exercise encouraged. Labs pending.  -     Semaglutide, 2 MG/DOSE, 8 MG/3ML SOPN;  Inject 2 mg as directed once a week. -     Bayer DCA Hb A1c Waived -     CMP14+EGFR -     Lipid panel -     Thyroid Panel With TSH  Chronic bilateral low back pain without sciatica Well controlled on current regimen. PCP on leave, will refill prescription today. Sedation precautions provided. Narcan sent to pharmacy.  -     Oxycodone HCl 10 MG TABS; Take 1 tablet (10 mg total) by mouth  every 8 (eight) hours as needed. -     ToxASSURE Select 13 (MW), Urine  Moderate episode of recurrent major depressive disorder ([place]) Anxiety Well controlled on current regimen, continue. Needs refill of Geodon today. EKG updated today. Will check thyroid function today.  -     ziprasidone (GEODON) 20 MG capsule; Take 1 capsule (20 mg total) by mouth in the morning and at bedtime. -     Thyroid Panel With TSH  Pure hypercholesterolemia Has been diet controlled. Will check labs today. Diet and exercise encouraged.  -     CMP14+EGFR -     Lipid panel  Breast abscess Almost resolved. No indications of systemic infection. Open and draining. Will treat with additional week of doxycycline as prescribed.  -    doxycycline 100 mg twice daily for 7 days.   Other iron deficiency anemia Will check anemia profile today.  -     Anemia Profile B  High risk medication use EKG without prolonged QT or acute changes. Labs pending.  -     EKG 12-Lead -     CMP14+EGFR -     Lipid panel -     Thyroid Panel With TSH -     ToxASSURE Select 13 (MW), Urine -     Anemia Profile B     Continue all other maintenance medications.  Follow up plan: Return in about 3 months (around 10/12/2021), or if symptoms worsen or fail to improve, for chronic follow up .   Continue healthy lifestyle choices, including diet (rich in fruits, vegetables, and lean proteins, and low in salt and simple carbohydrates) and exercise (at least 30 minutes of moderate physical activity daily).  Educational handout given for health maintenance  The above assessment and management plan was discussed with the patient. The patient verbalized understanding of and has agreed to the management plan. Patient is aware to call the clinic if they develop any new symptoms or if symptoms persist or worsen. Patient is aware when to return to the clinic for a follow-up visit. Patient educated on when it is appropriate to go to the emergency  department.   Monia Pouch, FNP-C [facility] [phone]

## 2021-07-16 ENCOUNTER — Encounter: Payer: Self-pay | Admitting: Family Medicine

## 2021-07-16 LAB — CMP14+EGFR
ALT: 21 IU/L (ref 0–32)
AST: 20 IU/L (ref 0–40)
Albumin/Globulin Ratio: 1.7 (ref 1.2–2.2)
Albumin: 4 g/dL (ref 3.8–4.9)
Alkaline Phosphatase: 101 IU/L (ref 44–121)
BUN/Creatinine Ratio: 9 — ABNORMAL LOW (ref 12–28)
BUN: 10 mg/dL (ref 8–27)
Bilirubin Total: 0.4 mg/dL (ref 0.0–1.2)
CO2: 24 mmol/L (ref 20–29)
Calcium: 10.3 mg/dL (ref 8.7–10.3)
Chloride: 105 mmol/L (ref 96–106)
Creatinine, Ser: 1.11 mg/dL — ABNORMAL HIGH (ref 0.57–1.00)
Globulin, Total: 2.3 g/dL (ref 1.5–4.5)
Glucose: 33 mg/dL — CL (ref 70–99)
Potassium: 3.7 mmol/L (ref 3.5–5.2)
Sodium: 144 mmol/L (ref 134–144)
Total Protein: 6.3 g/dL (ref 6.0–8.5)
eGFR: 57 mL/min/{1.73_m2} — ABNORMAL LOW (ref >59)

## 2021-07-16 LAB — ANEMIA PROFILE B
Basophils Absolute: 0.1 10*3/uL (ref 0.0–0.2)
Basos: 1 %
EOS (ABSOLUTE): 0.1 10*3/uL (ref 0.0–0.4)
Eos: 2 %
Ferritin: 176 ng/mL — ABNORMAL HIGH (ref 15–150)
Folate: >20 ng/mL (ref >3.0)
Hematocrit: 40.8 % (ref 34.0–46.6)
Hemoglobin: 13.5 g/dL (ref 11.1–15.9)
Immature Grans (Abs): 0 10*3/uL (ref 0.0–0.1)
Immature Granulocytes: 0 %
Iron Saturation: 29 % (ref 15–55)
Iron: 78 ug/dL (ref 27–159)
Lymphocytes Absolute: 1.2 10*3/uL (ref 0.7–3.1)
Lymphs: 19 %
MCH: 32.1 pg (ref 26.6–33.0)
MCHC: 33.1 g/dL (ref 31.5–35.7)
MCV: 97 fL (ref 79–97)
Monocytes Absolute: 0.5 10*3/uL (ref 0.1–0.9)
Monocytes: 8 %
Neutrophils Absolute: 4.3 10*3/uL (ref 1.4–7.0)
Neutrophils: 70 %
Platelets: 171 10*3/uL (ref 150–450)
RBC: 4.21 x10E6/uL (ref 3.77–5.28)
RDW: 12.7 % (ref 11.7–15.4)
Retic Ct Pct: 1.8 % (ref 0.6–2.6)
Total Iron Binding Capacity: 269 ug/dL (ref 250–450)
UIBC: 191 ug/dL (ref 131–425)
Vitamin B-12: 292 pg/mL (ref 232–1245)
WBC: 6.1 10*3/uL (ref 3.4–10.8)

## 2021-07-16 LAB — THYROID PANEL WITH TSH
Free Thyroxine Index: 1.4 (ref 1.2–4.9)
T3 Uptake Ratio: 24 % (ref 24–39)
T4, Total: 5.7 ug/dL (ref 4.5–12.0)
TSH: 2.93 u[IU]/mL (ref 0.450–4.500)

## 2021-07-16 LAB — LIPID PANEL
Chol/HDL Ratio: 4.3 ratio (ref 0.0–4.4)
Cholesterol, Total: 193 mg/dL (ref 100–199)
HDL: 45 mg/dL (ref >39)
LDL Chol Calc (NIH): 135 mg/dL — ABNORMAL HIGH (ref 0–99)
Triglycerides: 71 mg/dL (ref 0–149)
VLDL Cholesterol Cal: 13 mg/dL (ref 5–40)

## 2021-07-31 ENCOUNTER — Other Ambulatory Visit: Payer: Self-pay | Admitting: Family Medicine

## 2021-07-31 ENCOUNTER — Other Ambulatory Visit: Payer: Medicare Other

## 2021-07-31 DIAGNOSIS — Z79899 Other long term (current) drug therapy: Secondary | ICD-10-CM | POA: Diagnosis not present

## 2021-07-31 DIAGNOSIS — N289 Disorder of kidney and ureter, unspecified: Secondary | ICD-10-CM

## 2021-07-31 LAB — BMP8+EGFR
BUN/Creatinine Ratio: 9 — ABNORMAL LOW (ref 12–28)
BUN: 10 mg/dL (ref 8–27)
CO2: 24 mmol/L (ref 20–29)
Calcium: 10.3 mg/dL (ref 8.7–10.3)
Chloride: 108 mmol/L — ABNORMAL HIGH (ref 96–106)
Creatinine, Ser: 1.12 mg/dL — ABNORMAL HIGH (ref 0.57–1.00)
Glucose: 91 mg/dL (ref 70–99)
Potassium: 4.4 mmol/L (ref 3.5–5.2)
Sodium: 144 mmol/L (ref 134–144)
eGFR: 56 mL/min/{1.73_m2} — ABNORMAL LOW (ref >59)

## 2021-08-04 ENCOUNTER — Encounter: Payer: Self-pay | Admitting: Family Medicine

## 2021-08-04 DIAGNOSIS — U071 COVID-19: Secondary | ICD-10-CM | POA: Diagnosis not present

## 2021-08-05 ENCOUNTER — Encounter: Payer: Self-pay | Admitting: Family Medicine

## 2021-08-05 ENCOUNTER — Telehealth: Payer: Self-pay | Admitting: Family Medicine

## 2021-08-05 ENCOUNTER — Other Ambulatory Visit: Payer: Self-pay | Admitting: Family Medicine

## 2021-08-05 DIAGNOSIS — M545 Low back pain, unspecified: Secondary | ICD-10-CM

## 2021-08-05 DIAGNOSIS — G8929 Other chronic pain: Secondary | ICD-10-CM

## 2021-08-05 MED ORDER — OXYCODONE HCL 5 MG PO TABA: 10.0000 mg | ORAL_TABLET | Freq: Three times a day (TID) | ORAL | 0 refills | Status: DC | PRN

## 2021-08-05 MED ORDER — ENALAPRIL MALEATE 2.5 MG PO TABS: 2.5000 mg | ORAL_TABLET | Freq: Every day | ORAL | 1 refills | Status: DC

## 2021-08-05 NOTE — Telephone Encounter (Signed)
See My chart message

## 2021-08-05 NOTE — Addendum Note (Signed)
Addended by: Sonny Masters on: 08/05/2021 09:38 AM   Modules accepted: Orders

## 2021-08-05 NOTE — Progress Notes (Signed)
New RX for oxycodone sent to pharmacy due to medication backorder.

## 2021-08-05 NOTE — Addendum Note (Signed)
Addended by: Sonny Masters on: 08/05/2021 02:25 PM   Modules accepted: Orders

## 2021-08-05 NOTE — Telephone Encounter (Signed)
We need to call the pharmacy and determine this was not filled and that pt does need new prescription. If so, need to cancel 10 mg prescriptions.

## 2021-08-06 ENCOUNTER — Telehealth: Payer: Self-pay | Admitting: Family Medicine

## 2021-08-06 DIAGNOSIS — G8929 Other chronic pain: Secondary | ICD-10-CM

## 2021-08-06 DIAGNOSIS — M545 Low back pain, unspecified: Secondary | ICD-10-CM

## 2021-08-06 MED ORDER — OXYCODONE HCL 5 MG PO TABS: 10.0000 mg | ORAL_TABLET | Freq: Three times a day (TID) | ORAL | 0 refills | Status: DC | PRN

## 2021-08-06 NOTE — Telephone Encounter (Signed)
LM for pt to call back to discuss

## 2021-08-06 NOTE — Telephone Encounter (Signed)
New prescription has been sent.

## 2021-08-06 NOTE — Telephone Encounter (Signed)
I sent in this medication for you yesterday. You are to take two 5 mg tablets which is equal to 10 mg per dose. I sent in enough for 2 weeks until you can be seen by PCP or other medications are back in stock. I do not feel snorting your medications are appropriate or joking about doing this is appropriate. If you are or have been doing such in the past, we might need to discuss rehabilitation.  ?

## 2021-08-06 NOTE — Telephone Encounter (Signed)
Pt aware rx sent to pharmacy.

## 2021-08-06 NOTE — Telephone Encounter (Signed)
Spoke with pt - she states that the med sent in for OXY 5 is a derivative and that the pharm told her that this one was for crushing (she assumed the snorting part)  ?She states that she does not do that and just want the regular pill form. ? ? ? ? ?I called [facility] and spoke with pharmacist:  ?He states that the OXY 10 is on backorder ?The Abuse deter 5 mg is unavailable  ? ? ?But that REG plain OXY 5 mg is available - will need new rx sent - will set up  ? ? ?Please call pt - once completed and sent ? ?

## 2021-08-13 ENCOUNTER — Telehealth: Payer: Self-pay | Admitting: Family Medicine

## 2021-08-13 ENCOUNTER — Other Ambulatory Visit: Payer: Self-pay | Admitting: Family Medicine

## 2021-08-13 DIAGNOSIS — G8929 Other chronic pain: Secondary | ICD-10-CM

## 2021-08-13 MED ORDER — OXYCODONE HCL 5 MG PO TABS: 10.0000 mg | ORAL_TABLET | Freq: Three times a day (TID) | ORAL | 0 refills | Status: AC | PRN

## 2021-08-13 NOTE — Telephone Encounter (Signed)
Pt called to let provider know that she needs 5mg  of Oxycodone 3x per day instead of the 10mg  because the pharmacy doesn't have the 10mg  in stock.  ?

## 2021-08-13 NOTE — Telephone Encounter (Signed)
Please let the patient know that I sent their prescription to their pharmacy. Thanks, WS 

## 2021-08-14 NOTE — Telephone Encounter (Signed)
Pt aware.

## 2021-08-25 ENCOUNTER — Other Ambulatory Visit: Payer: Self-pay | Admitting: Family Medicine

## 2021-08-25 DIAGNOSIS — F419 Anxiety disorder, unspecified: Secondary | ICD-10-CM

## 2021-08-28 ENCOUNTER — Other Ambulatory Visit: Payer: Self-pay | Admitting: Family Medicine

## 2021-08-28 DIAGNOSIS — G8929 Other chronic pain: Secondary | ICD-10-CM

## 2021-08-28 DIAGNOSIS — M545 Low back pain, unspecified: Secondary | ICD-10-CM

## 2021-09-25 ENCOUNTER — Other Ambulatory Visit: Payer: Self-pay | Admitting: Family Medicine

## 2021-09-25 DIAGNOSIS — F331 Major depressive disorder, recurrent, moderate: Secondary | ICD-10-CM

## 2021-10-06 ENCOUNTER — Other Ambulatory Visit: Payer: Self-pay | Admitting: Family Medicine

## 2021-10-06 DIAGNOSIS — K219 Gastro-esophageal reflux disease without esophagitis: Secondary | ICD-10-CM

## 2021-10-09 ENCOUNTER — Encounter: Payer: Self-pay | Admitting: Family Medicine

## 2021-10-09 ENCOUNTER — Ambulatory Visit (INDEPENDENT_AMBULATORY_CARE_PROVIDER_SITE_OTHER): Payer: Medicare Other | Admitting: Family Medicine

## 2021-10-09 DIAGNOSIS — M542 Cervicalgia: Secondary | ICD-10-CM | POA: Diagnosis not present

## 2021-10-09 DIAGNOSIS — H60503 Unspecified acute noninfective otitis externa, bilateral: Secondary | ICD-10-CM | POA: Diagnosis not present

## 2021-10-09 DIAGNOSIS — M545 Low back pain, unspecified: Secondary | ICD-10-CM

## 2021-10-09 DIAGNOSIS — K219 Gastro-esophageal reflux disease without esophagitis: Secondary | ICD-10-CM | POA: Diagnosis not present

## 2021-10-09 DIAGNOSIS — F5101 Primary insomnia: Secondary | ICD-10-CM | POA: Diagnosis not present

## 2021-10-09 DIAGNOSIS — Z79899 Other long term (current) drug therapy: Secondary | ICD-10-CM | POA: Diagnosis not present

## 2021-10-09 DIAGNOSIS — F331 Major depressive disorder, recurrent, moderate: Secondary | ICD-10-CM | POA: Diagnosis not present

## 2021-10-09 DIAGNOSIS — G8929 Other chronic pain: Secondary | ICD-10-CM | POA: Diagnosis not present

## 2021-10-09 DIAGNOSIS — F419 Anxiety disorder, unspecified: Secondary | ICD-10-CM | POA: Diagnosis not present

## 2021-10-09 DIAGNOSIS — M5412 Radiculopathy, cervical region: Secondary | ICD-10-CM

## 2021-10-09 MED ORDER — PREDNISONE 20 MG PO TABS: 40.0000 mg | ORAL_TABLET | Freq: Every day | ORAL | 0 refills | Status: AC

## 2021-10-09 MED ORDER — ZIPRASIDONE HCL 20 MG PO CAPS: 20.0000 mg | ORAL_CAPSULE | Freq: Two times a day (BID) | ORAL | 1 refills | Status: DC

## 2021-10-09 MED ORDER — OXYCODONE HCL 10 MG PO TABS: 10.0000 mg | ORAL_TABLET | Freq: Three times a day (TID) | ORAL | 0 refills | Status: AC | PRN

## 2021-10-09 MED ORDER — HYDROXYZINE HCL 10 MG PO TABS: 10.0000 mg | ORAL_TABLET | Freq: Three times a day (TID) | ORAL | 11 refills | Status: DC | PRN

## 2021-10-09 MED ORDER — OFLOXACIN 0.3 % OT SOLN: 5.0000 [drp] | Freq: Every day | OTIC | 0 refills | Status: AC

## 2021-10-09 MED ORDER — CELECOXIB 200 MG PO CAPS: 200.0000 mg | ORAL_CAPSULE | Freq: Every day | ORAL | 0 refills | Status: DC

## 2021-10-09 MED ORDER — BUSPIRONE HCL 15 MG PO TABS: ORAL_TABLET | ORAL | 3 refills | Status: DC

## 2021-10-09 MED ORDER — BACLOFEN 10 MG PO TABS: 10.0000 mg | ORAL_TABLET | Freq: Three times a day (TID) | ORAL | 5 refills | Status: DC | PRN

## 2021-10-09 MED ORDER — OXYCODONE HCL 10 MG PO TABS: 10.0000 mg | ORAL_TABLET | Freq: Three times a day (TID) | ORAL | 0 refills | Status: DC | PRN

## 2021-10-09 MED ORDER — TRAZODONE HCL 100 MG PO TABS: 200.0000 mg | ORAL_TABLET | Freq: Every day | ORAL | 3 refills | Status: DC

## 2021-10-09 NOTE — Progress Notes (Signed)
? ?Established Patient Office Visit ? ?Subjective   ?Patient ID: Hannah [name]. Helminiak, female    DOB: Dec 29, 1960  Age: 61 y.o. MRN: [id number] ? ?Chief Complaint  ?Patient presents with  ? Medical Management of Chronic Issues  ? Arm Pain  ?  Left arm pain / swelling for 3wks now   ? Ear Pain  ?  Left ear , sore very painful only when she sleeps for about 2 weeks now  ? ? ?HPI ? ?Obesity ?She has been maintaining a healthy diet. She is down another 11 lbs. She is planning to join [employer] to increase her physical activity. She continues on ozempic and is tolerating this well.  ? ?2. Chronic neck and back pain ?She takes baclofen prn and celebrex daily.  ?Pain assessment: ?Cause of pain- old trauma from [place] accident ?Pain location- lower back and neck ?Pain on scale of 1-10- 6/10 without medications, 0/10 with medications ?Frequency- daily ?What increases pain-activity like house work or lifting objects ?What makes pain Better-resting and medicatoin ?Effects on ADL - minimal to none ?Any change in general medical condition-none ?  ?Current opioids rx- Oxycodone 10 mg TID PRN ?# meds rx- 65 ?Effectiveness of current meds-good ?Adverse reactions from pain meds-none ?Morphine equivalent- 43.5 MME ?  ?Pill count performed-No ?Last drug screen - updated today ?Urine drug screen today- Yes ?Was the [place] reviewed- yes ?            If yes were their any concerning findings? - no ?CSA signed today.  ? ?3. Left upper arm pain ?She does report a dull ache down her left upper arm for the last few weeks, worse at night. Denies injury, numbness, tingling, or decreased ROM. ? ?4. Ear pain ?She reports left ear pain for the last 2 weeks. It is very tender when touched and hurts at night when sleeping. Her right ear is also tender now as well. Denies fever, drainage, or nasal congestion.  ? ?5. Depression/anxiety/insomnia ?Has been on geodone and zoloft for years with good control. She is taking buspar  TID. She used to also  take vistaril prn and would like a refill of this to have on hand. She takes trazodone for insomnia with good relief.  ? ? ?  10/09/2021  ? 10:40 AM 07/15/2021  ?  8:08 AM 06/24/2021  ? 12:02 PM 05/14/2021  ?  8:22 AM  ?GAD 7 : Generalized Anxiety Score  ?Nervous, Anxious, on Edge 0 0 0 0  ?Control/stop worrying 1 1 1  0  ?Worry too much - different things 1 0 1 0  ?Trouble relaxing 0 0 0 0  ?Restless 0 0 0 0  ?Easily annoyed or irritable 0 0 0 1  ?Afraid - awful might happen 0 0 0 0  ?Total GAD 7 Score 2 1 2 1   ?Anxiety Difficulty Not difficult at all Not difficult at all Somewhat difficult Not difficult at all  ? ? ? ? ?Patient Active Problem List  ? Diagnosis Date Noted  ? Depression, major, recurrent, in remission ([place]) 04/16/2021  ? Iron deficiency anemia 04/16/2021  ? Pure hypercholesterolemia 01/15/2021  ? Chronic neck pain 01/15/2021  ? Constipation 11/06/2020  ? Unilateral primary osteoarthritis, left knee 10/23/2020  ? Primary insomnia 07/15/2020  ? Controlled substance agreement signed 07/15/2020  ? Depression, recurrent ([place]) 07/15/2020  ? Generalized edema 07/15/2020  ? Morbid obesity ([place]) 01/15/2020  ? Chronic bilateral low back pain without sciatica 01/15/2020  ? Anxiety   ?  Moderate episode of recurrent major depressive disorder ([place]) 08/04/2017  ? ?  ? ? ?  10/09/2021  ? 10:40 AM 07/15/2021  ?  8:07 AM 06/24/2021  ? 12:02 PM  ?Depression screen PHQ 2/9  ?Decreased Interest 0 0 0  ?Down, Depressed, Hopeless 1 0 1  ?PHQ - 2 Score 1 0 1  ?Altered sleeping 0 0 0  ?Tired, decreased energy 1 1 1   ?Change in appetite 0 0 1  ?Feeling bad or failure about yourself  0 0 0  ?Trouble concentrating 0 0 0  ?Moving slowly or fidgety/restless 0 0 0  ?Suicidal thoughts 0 0 0  ?PHQ-9 Score 2 1 3   ?Difficult doing work/chores Somewhat difficult Not difficult at all Somewhat difficult  ? ? ?  10/09/2021  ? 10:40 AM 07/15/2021  ?  8:08 AM 06/24/2021  ? 12:02 PM 05/14/2021  ?  8:22 AM  ?GAD 7 : Generalized Anxiety Score  ?Nervous,  Anxious, on Edge 0 0 0 0  ?Control/stop worrying 1 1 1  0  ?Worry too much - different things 1 0 1 0  ?Trouble relaxing 0 0 0 0  ?Restless 0 0 0 0  ?Easily annoyed or irritable 0 0 0 1  ?Afraid - awful might happen 0 0 0 0  ?Total GAD 7 Score 2 1 2 1   ?Anxiety Difficulty Not difficult at all Not difficult at all Somewhat difficult Not difficult at all  ? ?BP Readings from Last 3 Encounters:  ?10/09/21 99/61  ?07/15/21 99/62  ?06/24/21 102/70  ? ? ? ?ROS ?Negative unless specially indicated above in HPI. ?  ?Objective:  ?  ? ?BP 99/61   Pulse 78   Temp 97.8 ?F (36.6 ?C) (Temporal)   Resp 20   Ht 5' 3"  (1.6 m)   Wt 197 lb (89.4 kg)   SpO2 96%   BMI 34.90 kg/m?  ?Wt Readings from Last 3 Encounters:  ?10/09/21 197 lb (89.4 kg)  ?07/15/21 208 lb 2 oz (94.4 kg)  ?06/24/21 214 lb (97.1 kg)  ? ?  ? ?Physical Exam ?Vitals and nursing note reviewed.  ?Constitutional:   ?   General: She is not in acute distress. ?   Appearance: She is not ill-appearing, toxic-appearing or diaphoretic.  ?HENT:  ?   Head: Normocephalic and atraumatic.  ?   Right Ear: Tympanic membrane normal. Swelling (canal) present. No tenderness.  ?   Left Ear: Tympanic membrane normal. Swelling (canal) and tenderness (tragus) present.  ?   Nose: Nose normal.  ?Cardiovascular:  ?   Rate and Rhythm: Normal rate and regular rhythm.  ?   Heart sounds: Normal heart sounds. No murmur heard. ?Pulmonary:  ?   Effort: Pulmonary effort is normal. No respiratory distress.  ?   Breath sounds: Normal breath sounds.  ?Abdominal:  ?   General: Bowel sounds are normal. There is no distension.  ?   Palpations: Abdomen is soft.  ?   Tenderness: There is no abdominal tenderness. There is no guarding or rebound.  ?Musculoskeletal:  ?   Left upper arm: Normal.  ?   Right lower leg: No edema.  ?   Left lower leg: No edema.  ?Skin: ?   General: Skin is warm and dry.  ?Neurological:  ?   General: No focal deficit present.  ?   Mental Status: She is alert and oriented to  person, place, and time.  ?Psychiatric:     ?   Mood and  Affect: Mood normal.     ?   Behavior: Behavior normal.     ?   Thought Content: Thought content normal.     ?   Judgment: Judgment normal.  ? ? ? ?No results found for any visits on 10/09/21. ? ?Last CBC ?Lab Results  ?Component Value Date  ? WBC 6.1 07/15/2021  ? HGB 13.5 07/15/2021  ? HCT 40.8 07/15/2021  ? MCV 97 07/15/2021  ? MCH 32.1 07/15/2021  ? RDW 12.7 07/15/2021  ? PLT 171 07/15/2021  ? ?Last metabolic panel ?Lab Results  ?Component Value Date  ? GLUCOSE 91 07/31/2021  ? NA 144 07/31/2021  ? K 4.4 07/31/2021  ? CL 108 (H) 07/31/2021  ? CO2 24 07/31/2021  ? BUN 10 07/31/2021  ? CREATININE 1.12 (H) 07/31/2021  ? EGFR 56 (L) 07/31/2021  ? CALCIUM 10.3 07/31/2021  ? PROT 6.3 07/15/2021  ? ALBUMIN 4.0 07/15/2021  ? LABGLOB 2.3 07/15/2021  ? AGRATIO 1.7 07/15/2021  ? BILITOT 0.4 07/15/2021  ? ALKPHOS 101 07/15/2021  ? AST 20 07/15/2021  ? ALT 21 07/15/2021  ? ?Last lipids ?Lab Results  ?Component Value Date  ? CHOL 193 07/15/2021  ? HDL 45 07/15/2021  ? LDLCALC 135 (H) 07/15/2021  ? TRIG 71 07/15/2021  ? CHOLHDL 4.3 07/15/2021  ? ?Last thyroid functions ?Lab Results  ?Component Value Date  ? TSH 2.930 07/15/2021  ? T4TOTAL 5.7 07/15/2021  ? ?  ? ?The 10-year ASCVD risk score (Arnett DK, et al., 2019) is: 2.2% ? ?  ?Assessment & Plan:  ? ?Clotile was seen today for medical management of chronic issues, arm pain and ear pain. ? ?Diagnoses and all orders for this visit: ? ?Morbid obesity ([place]) ?Continue ozempic, diet, and exercise. Has had significant success so far. Labs pending.  ?-     CBC with Differential/Platelet ?-     CMP14+EGFR ?-     Lipid panel ? ?Gastroesophageal reflux disease without esophagitis ?Well controlled on current regimen. Labs pending.  ?-     CBC with Differential/Platelet ?-     CMP14+EGFR ? ?Chronic bilateral low back pain without sciatica ?Chronic neck pain ?Controlled substance agreement signed ?CSA and UDS updated today. PDMP  reviewed, no red flags. Refills provided as below.  ?-     celecoxib (CELEBREX) 200 MG capsule; Take 1 capsule (200 mg total) by mouth daily. ?-     ToxASSURE Select 13 (MW), Urine ?-     Oxycodone HCl 10 MG TABS; Take 1 tabl

## 2021-10-09 NOTE — Patient Instructions (Signed)
Cervical Radiculopathy  Cervical radiculopathy happens when a nerve in the neck (a cervical nerve) is pinched or bruised. This condition can happen because of an injury to the cervical spine (vertebrae) in the neck, or as part of the normal aging process. Pressure on the cervical nerves can cause pain or numbness that travels from the neck all the way down to the arm and fingers. This condition usually gets better with rest. Treatment may be needed if the condition does not improve. What are the causes? This condition may be caused by: A neck injury. A bulging (herniated) disk. Muscle spasms. Muscle tightness in the neck due to overuse. Arthritis. Breakdown or degeneration in the bones and joints of the spine (spondylosis) due to aging. Bone spurs that may develop near the cervical nerves. What are the signs or symptoms? Symptoms of this condition include: Pain. The pain may travel from the neck to the arm and hand. The pain can be severe or irritating. It may get worse when you move your neck. Numbness or tingling in your arm or hand. Weakness in the affected arm and hand, in severe cases. How is this diagnosed? This condition may be diagnosed based on your symptoms, your medical history, and a physical exam. You may also have tests, including: X-rays. CT scan. MRI. Electromyogram (EMG). Nerve conduction tests. How is this treated? In many cases, treatment is not needed for this condition. With rest, the condition usually gets better over time. If treatment is needed, options may include: Wearing a soft neck collar (cervical collar) for short periods of time. Doing physical therapy to strengthen your neck muscles. Taking medicines. These may include NSAIDs, such as ibuprofen, or oral corticosteroids. Having spinal injections, in severe cases. Having surgery. This may be needed if other treatments do not help. Different types of surgery may be done depending on the cause of this  condition. Follow these instructions at home: If you have a cervical collar: Wear it as told by your health care provider. Remove it only as told by your health care provider. Ask your health care provider if you can remove the cervical collar for cleaning and bathing. If you are allowed to remove the collar for cleaning or bathing: Follow instructions from your health care provider about how to remove the collar safely. Clean the collar by wiping it with mild soap and water and drying it completely. Take out any removable pads in the collar every 1-2 days, and wash them by hand with soap and water. Let them air-dry completely before you put them back in the collar. Check your skin under the collar for irritation or sores. If you see any, tell your health care provider. Managing pain     Take over-the-counter and prescription medicines only as told by your health care provider. If directed, put ice on the affected area. To do this: If you have a soft neck collar, remove it as told by your health care provider. Put ice in a plastic bag. Place a towel between your skin and the bag. Leave the ice on for 20 minutes, 2-3 times a day. Remove the ice if your skin turns bright red. This is very important. If you cannot feel pain, heat, or cold, you have a greater risk of damage to the area. If applying ice does not help, you can try using heat. Use the heat source that your health care provider recommends, such as a moist heat pack or a heating pad. Place a towel between   your skin and the heat source. Leave the heat on for 20-30 minutes. Remove the heat if your skin turns bright red. This is especially important if you are unable to feel pain, heat, or cold. You have a greater risk of getting burned. Try a gentle neck and shoulder massage to help relieve symptoms. Activity Rest as needed. Return to your normal activities as told by your health care provider. Ask your health care provider what  activities are safe for you. Do stretching and strengthening exercises as told by your health care provider or your physical therapist. You may have to avoid lifting. Ask your health care provider how much you can safely lift. General instructions Use a flat pillow when you sleep. Do not drive while wearing a cervical collar. If you do not have a cervical collar, ask your health care provider if it is safe to drive while your neck heals. Ask your health care provider if the medicine prescribed to you requires you to avoid driving or using machinery. Do not use any products that contain nicotine or tobacco. These products include cigarettes, chewing tobacco, and vaping devices, such as e-cigarettes. If you need help quitting, ask your health care provider. Keep all follow-up visits. This is important. Contact a health care provider if: Your condition does not improve with treatment. Get help right away if: Your pain gets much worse and is not controlled with medicines. You have weakness or numbness in your hand, arm, face, or leg. You have a high fever. You have a stiff, rigid neck. You lose control of your bowels or your bladder (have incontinence). You have trouble with walking, balance, or speaking. Summary Cervical radiculopathy happens when a nerve in the neck is pinched or bruised. A nerve can get pinched from a bulging disk, arthritis, muscle spasms, or an injury to the neck. Symptoms include pain, tingling, or numbness radiating from the neck to the arm or hand. Weakness can also occur in severe cases. Treatment may include rest, wearing a cervical collar, and physical therapy. Medicines may be prescribed to help with pain. In severe cases, injections or surgery may be needed. This information is not intended to replace advice given to you by your health care provider. Make sure you discuss any questions you have with your health care provider. Document Revised: 11/28/2020 Document  Reviewed: 11/28/2020 Elsevier Patient Education  2023 [employer]  

## 2021-10-10 ENCOUNTER — Encounter: Payer: Self-pay | Admitting: Family Medicine

## 2021-10-10 LAB — CMP14+EGFR
ALT: 13 IU/L (ref 0–32)
AST: 19 IU/L (ref 0–40)
Albumin/Globulin Ratio: 1.6 (ref 1.2–2.2)
Albumin: 4 g/dL (ref 3.8–4.9)
Alkaline Phosphatase: 72 IU/L (ref 44–121)
BUN/Creatinine Ratio: 10 — ABNORMAL LOW (ref 12–28)
BUN: 11 mg/dL (ref 8–27)
Bilirubin Total: 0.4 mg/dL (ref 0.0–1.2)
CO2: 26 mmol/L (ref 20–29)
Calcium: 10 mg/dL (ref 8.7–10.3)
Chloride: 104 mmol/L (ref 96–106)
Creatinine, Ser: 1.06 mg/dL — ABNORMAL HIGH (ref 0.57–1.00)
Globulin, Total: 2.5 g/dL (ref 1.5–4.5)
Glucose: 80 mg/dL (ref 70–99)
Potassium: 4.1 mmol/L (ref 3.5–5.2)
Sodium: 142 mmol/L (ref 134–144)
Total Protein: 6.5 g/dL (ref 6.0–8.5)
eGFR: 60 mL/min/{1.73_m2} (ref >59)

## 2021-10-10 LAB — CBC WITH DIFFERENTIAL/PLATELET
Basophils Absolute: 0.1 10*3/uL (ref 0.0–0.2)
Basos: 1 %
EOS (ABSOLUTE): 0.1 10*3/uL (ref 0.0–0.4)
Eos: 2 %
Hematocrit: 38.2 % (ref 34.0–46.6)
Hemoglobin: 12.6 g/dL (ref 11.1–15.9)
Immature Grans (Abs): 0 10*3/uL (ref 0.0–0.1)
Immature Granulocytes: 0 %
Lymphocytes Absolute: 1.2 10*3/uL (ref 0.7–3.1)
Lymphs: 18 %
MCH: 31.1 pg (ref 26.6–33.0)
MCHC: 33 g/dL (ref 31.5–35.7)
MCV: 94 fL (ref 79–97)
Monocytes Absolute: 0.4 10*3/uL (ref 0.1–0.9)
Monocytes: 6 %
Neutrophils Absolute: 4.8 10*3/uL (ref 1.4–7.0)
Neutrophils: 73 %
Platelets: 170 10*3/uL (ref 150–450)
RBC: 4.05 x10E6/uL (ref 3.77–5.28)
RDW: 12 % (ref 11.7–15.4)
WBC: 6.5 10*3/uL (ref 3.4–10.8)

## 2021-10-10 LAB — LIPID PANEL
Chol/HDL Ratio: 3.9 ratio (ref 0.0–4.4)
Cholesterol, Total: 181 mg/dL (ref 100–199)
HDL: 47 mg/dL (ref >39)
LDL Chol Calc (NIH): 119 mg/dL — ABNORMAL HIGH (ref 0–99)
Triglycerides: 79 mg/dL (ref 0–149)
VLDL Cholesterol Cal: 15 mg/dL (ref 5–40)

## 2021-10-13 ENCOUNTER — Encounter: Payer: Self-pay | Admitting: Family Medicine

## 2021-10-13 ENCOUNTER — Ambulatory Visit: Payer: Medicare Other | Admitting: Family Medicine

## 2021-10-13 ENCOUNTER — Ambulatory Visit: Payer: Medicare Other

## 2021-10-16 LAB — TOXASSURE SELECT 13 (MW), URINE

## 2021-10-23 ENCOUNTER — Encounter: Payer: Self-pay | Admitting: Family Medicine

## 2021-10-23 ENCOUNTER — Ambulatory Visit: Payer: Medicare Other | Admitting: Emergency Medicine

## 2021-10-23 VITALS — BP 101/66 | HR 77

## 2021-10-23 DIAGNOSIS — F419 Anxiety disorder, unspecified: Secondary | ICD-10-CM

## 2021-10-23 NOTE — Progress Notes (Signed)
Home Pressures have been as follows:  Sunday-105/69  Mon- 75/45- first in the AM , 101/65  Tuesday- 105/71  Thursday- 85/55( this afternoon)   BP in the office was 101/66 P- 77  Patient is going out of town tomorrow to [place]

## 2021-10-23 NOTE — Progress Notes (Signed)
Have her decrease trazodone to 100 mg as this could be causing her BP to be low. Continue to monitor BP and see if this improves with decrease in trazodone. Stay well hydrated. She should be evaluated if she is dizzy or lightheaded, otherwise follow up in the office when she returns from [place].

## 2021-10-29 ENCOUNTER — Other Ambulatory Visit: Payer: Self-pay | Admitting: Family Medicine

## 2021-10-29 DIAGNOSIS — Z1231 Encounter for screening mammogram for malignant neoplasm of breast: Secondary | ICD-10-CM

## 2021-11-04 ENCOUNTER — Ambulatory Visit
Admission: RE | Admit: 2021-11-04 | Discharge: 2021-11-04 | Disposition: A | Discharge status: Home / Self Care | Payer: Medicare Other | Source: Ambulatory Visit | Attending: Family Medicine | Admitting: Family Medicine

## 2021-11-04 DIAGNOSIS — Z1231 Encounter for screening mammogram for malignant neoplasm of breast: Secondary | ICD-10-CM

## 2021-11-06 ENCOUNTER — Ambulatory Visit (INDEPENDENT_AMBULATORY_CARE_PROVIDER_SITE_OTHER): Payer: Medicare Other

## 2021-11-06 VITALS — Wt 197.0 lb

## 2021-11-06 DIAGNOSIS — F419 Anxiety disorder, unspecified: Secondary | ICD-10-CM | POA: Diagnosis not present

## 2021-11-06 DIAGNOSIS — Z658 Other specified problems related to psychosocial circumstances: Secondary | ICD-10-CM | POA: Diagnosis not present

## 2021-11-06 DIAGNOSIS — Z0000 Encounter for general adult medical examination without abnormal findings: Secondary | ICD-10-CM

## 2021-11-06 DIAGNOSIS — F3340 Major depressive disorder, recurrent, in remission, unspecified: Secondary | ICD-10-CM | POA: Diagnosis not present

## 2021-11-06 DIAGNOSIS — Z0001 Encounter for general adult medical examination with abnormal findings: Secondary | ICD-10-CM | POA: Diagnosis not present

## 2021-11-06 IMAGING — DX DG KNEE 1-2V*L*
2 series · 2 of 2 positions shown · non-contrast
Comparison: None.

CLINICAL DATA: Posterior knee pain.

EXAM:
LEFT KNEE - 1-2 VIEW

[knee ap]
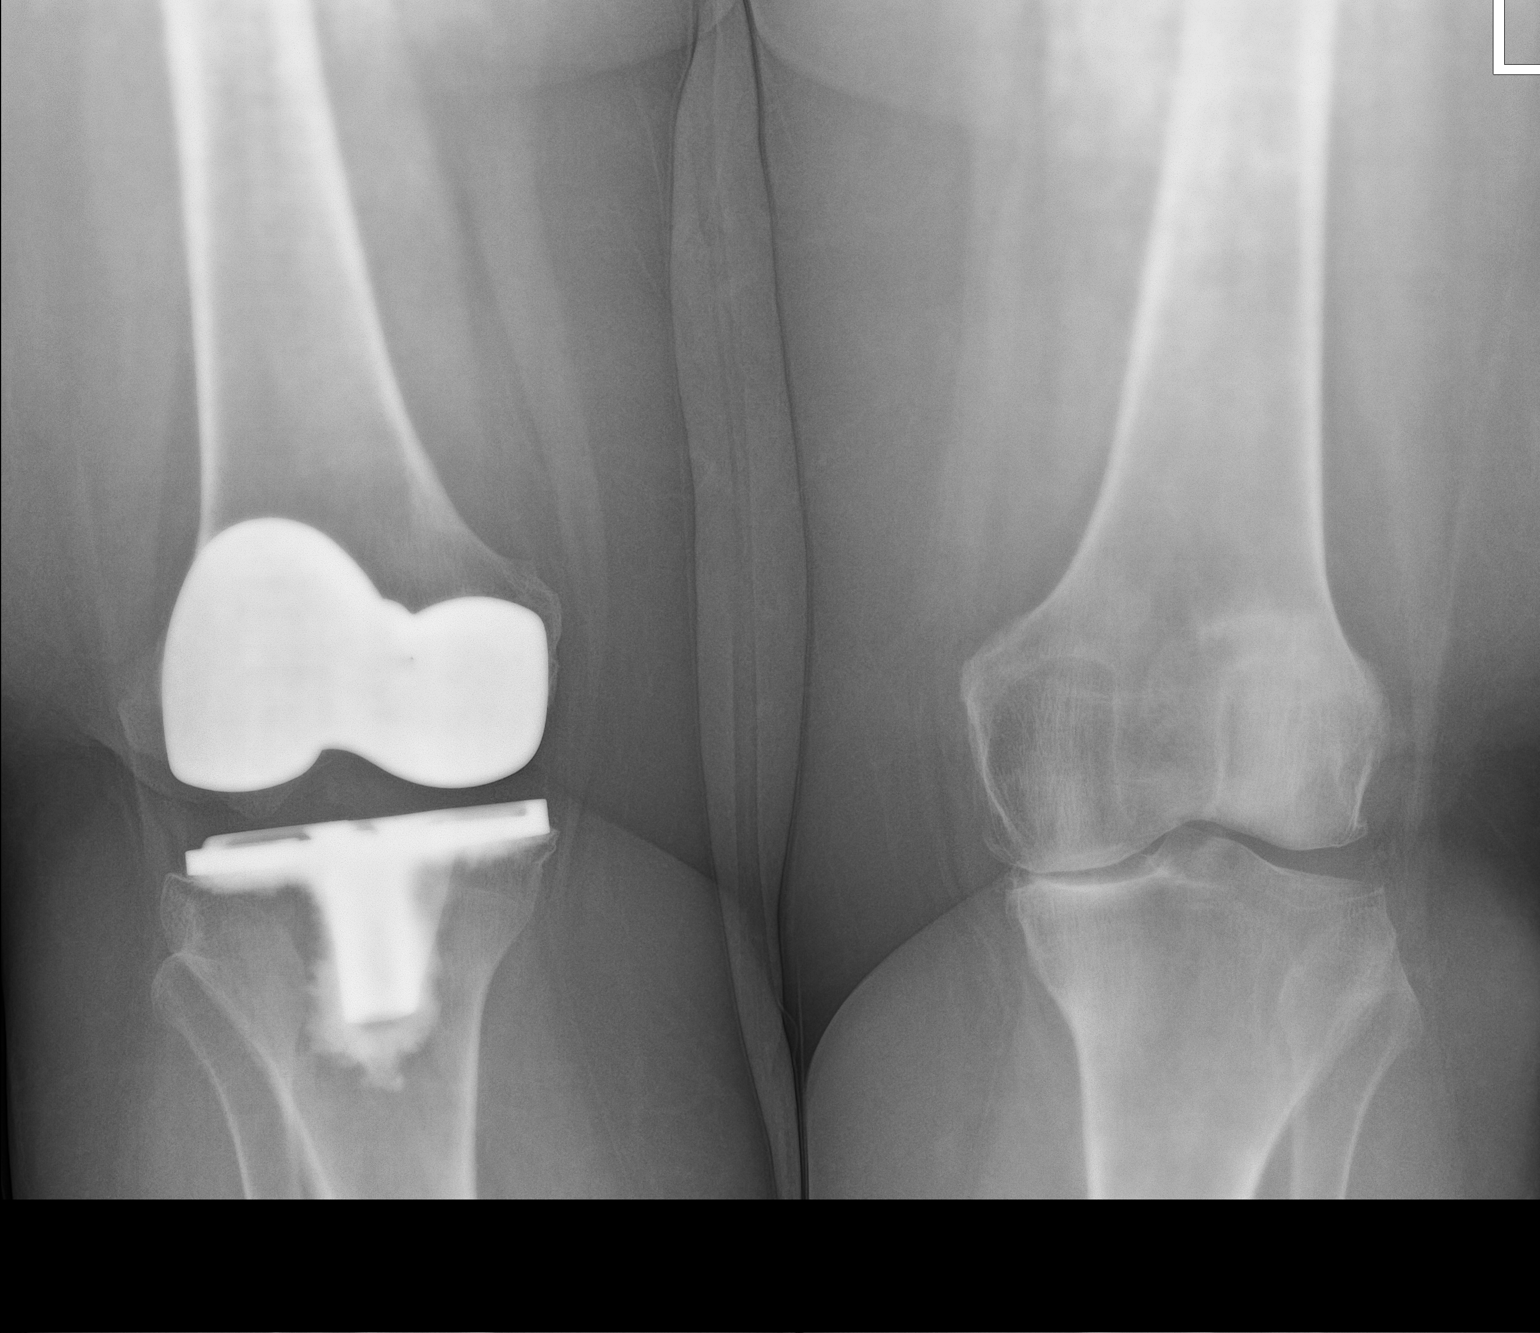

[knee lat]
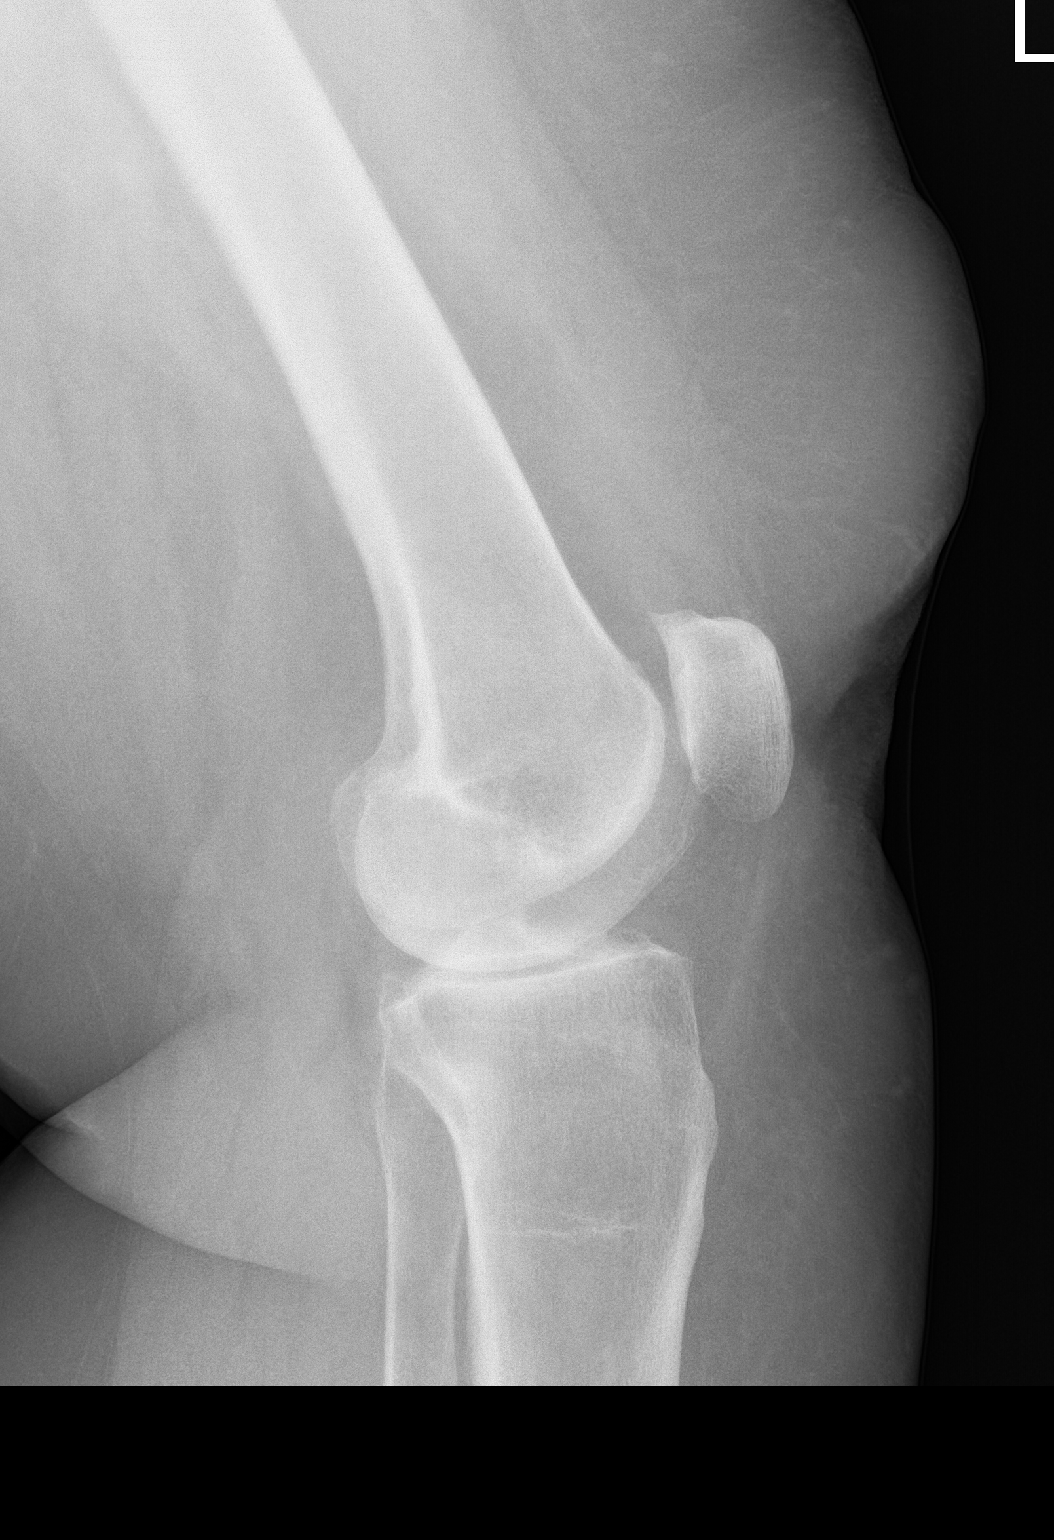

[2 of 2 positions shown; findings below may reference images not displayed]

FINDINGS: No evidence of an acute fracture or dislocation. Moderate to marked
severity medial tibiofemoral compartment space narrowing is seen.
Soft tissues are unremarkable. Of incidental note is the presence of
an intact right hip replacement seen on the frontal view.
IMPRESSION: Degenerative changes with a small joint effusion.

## 2021-11-06 NOTE — Progress Notes (Signed)
Subjective:   Hannah [name] is a 61 y.o. female who presents for Medicare Annual (Subsequent) preventive examination.  Virtual Visit via Telephone Note  I connected with  Hannah [name] on 11/06/21 at  3:30 PM EDT by telephone and verified that I am speaking with the correct person using two identifiers.  Location: Patient: Home Provider: [facility] Persons participating in the virtual visit: patient/Nurse Health Advisor   I discussed the limitations, risks, security and privacy concerns of performing an evaluation and management service by telephone and the availability of in person appointments. The patient expressed understanding and agreed to proceed.  Interactive audio and video telecommunications were attempted between this nurse and patient, however failed, due to patient having technical difficulties OR patient did not have access to video capability.  We continued and completed visit with audio only.  Some vital signs may be absent or patient reported.   Kolter Reaver E Shaima Sardinas, LPN   Review of Systems     Cardiac Risk Factors include: dyslipidemia;sedentary lifestyle;obesity (BMI >30kg/m2);Other (see comment), Risk factor comments: anemia     Objective:    Today's Vitals   11/06/21 1517  Weight: 197 lb (89.4 kg)   Body mass index is 34.9 kg/m.     11/06/2021    3:41 PM 11/05/2020    4:23 PM  Advanced Directives  Does Patient Have a Medical Advance Directive? No No  Would patient like information on creating a medical advance directive? No - Patient declined No - Guardian declined    Current Medications (verified) Outpatient Encounter Medications as of 11/06/2021  Medication Sig   B Complex-C (SUPER B COMPLEX PO) Take 1 tablet by mouth daily.   baclofen (LIORESAL) 10 MG tablet Take 1 tablet (10 mg total) by mouth 3 (three) times daily as needed. for muscle spams   busPIRone (BUSPAR) 15 MG tablet TAKE 1 TABLET BY MOUTH IN THE MORNING AND  1  BY  MOUTH  AT  NOON  AND  1  BY   MOUTH  AT  BEDTIME   celecoxib (CELEBREX) 200 MG capsule Take 1 capsule (200 mg total) by mouth daily.   Cholecalciferol (VITAMIN D3) 125 MCG (5000 UT) CAPS Take 1 capsule by mouth daily.   CINNAMON PO Take by mouth.   diphenhydrAMINE (BENADRYL) 25 mg capsule Take 50 mg by mouth daily as needed.   famotidine (PEPCID) 20 MG tablet Take 20 mg by mouth daily.   hydrOXYzine (ATARAX) 10 MG tablet Take 1 tablet (10 mg total) by mouth 3 (three) times daily as needed.   naloxone (NARCAN) nasal spray 4 mg/0.1 mL Use nasally for overdose   Oxycodone HCl 10 MG TABS Take 1 tablet (10 mg total) by mouth 3 (three) times daily as needed.   pantoprazole (PROTONIX) 20 MG tablet Take 1 tablet by mouth once daily   Semaglutide, 2 MG/DOSE, 8 MG/3ML SOPN Inject 2 mg as directed once a week.   sertraline (ZOLOFT) 100 MG tablet TAKE 1 TABLET BY MOUTH IN THE MORNING AND AT BEDTIME   traZODone (DESYREL) 100 MG tablet Take 2 tablets (200 mg total) by mouth at bedtime.   ziprasidone (GEODON) 20 MG capsule Take 1 capsule (20 mg total) by mouth in the morning and at bedtime.   [START ON 11/09/2021] Oxycodone HCl 10 MG TABS Take 1 tablet (10 mg total) by mouth 3 (three) times daily as needed.   [START ON 12/10/2021] Oxycodone HCl 10 MG TABS Take 1 tablet (10 mg total)  by mouth 3 (three) times daily as needed.   No facility-administered encounter medications on file as of 11/06/2021.    Allergies (verified) Demerol [meperidine], Other, Latex, Morphine, and Sulfa antibiotics   History: Past Medical History:  Diagnosis Date   Acquired dilation of bile duct    Alcohol abuse    Anemia 06/27/2000   Anxiety    Arthritis 05/14/1998   Blood type O+    Chronic neck and back pain    Compulsive skin picking 1998   Depression    Drug abuse (HCC)    GERD (gastroesophageal reflux disease)    History of iron deficiency anemia    Hyperlipidemia    Insomnia    Migraines    Osteoporosis 07/08/2017   T-score -2.6 in 2019; -2.2 on  08/05/2018   Vitamin D deficiency    Past Surgical History:  Procedure Laterality Date   ABDOMINAL HYSTERECTOMY  09/07/2006   CESAREAN SECTION  02/16/1990   CHOLECYSTECTOMY  2001   ENDOMETRIAL ABLATION W/ NOVASURE     GASTRIC BYPASS  1998   HEMORRHOID SURGERY  2003   HERNIA REPAIR  2004   JOINT REPLACEMENT  08/21/2016   KNEE ARTHROSCOPY Right 2008   MOUTH SURGERY     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   Family History  Problem Relation Age of Onset   Anxiety disorder Mother    Depression Mother    Breast cancer Mother    Ovarian cancer Mother    Obesity Mother    Alcohol abuse Father    Heart disease Father    Kidney disease Brother    Heart disease Brother    Breast cancer Maternal Aunt    Breast cancer Maternal Grandmother    Breast cancer Maternal Grandfather    Social History   Socioeconomic History   Marital status: Single    Spouse name: Not on file   Number of children: 1   Years of education: Not on file   Highest education level: Some college, no degree  Occupational History   Occupation: disabled  Tobacco Use   Smoking status: Former    Packs/day: 1.00    Types: Cigarettes    Quit date: 06/08/1989    Years since quitting: 32.4   Smokeless tobacco: Never  Vaping Use   Vaping Use: Never used  Substance and Sexual Activity   Alcohol use: Not Currently   Drug use: Not Currently   Sexual activity: Not Currently    Birth control/protection: Abstinence  Other Topics Concern   Not on file  Social History Narrative   Lives alone, one daughter that lives in [place]. Loves to read and go on walks.    Social Determinants of Health   Financial Resource Strain: Low Risk    Difficulty of Paying Living Expenses: Not very hard  Food Insecurity: No Food Insecurity   Worried About Programme researcher, broadcasting/film/video in the Last Year: Never true   Ran Out of Food in the Last Year: Never true  Transportation Needs: No Transportation Needs   Lack of Transportation (Medical): No    Lack of Transportation (Non-Medical): No  Physical Activity: Insufficiently Active   Days of Exercise per Week: 3 days   Minutes of Exercise per Session: 30 min  Stress: Stress Concern Present   Feeling of Stress : To some extent  Social Connections: Socially Isolated   Frequency of Communication with Friends and Family: More than three times a week   Frequency of Social Gatherings with  Friends and Family: Never   Attends Religious Services: Never   Database administrator or Organizations: No   Attends Engineer, structural: Never   Marital Status: Divorced    Tobacco Counseling Counseling given: Not Answered   Clinical Intake:  Pre-visit preparation completed: Yes  Pain : No/denies pain     BMI - recorded: 34.9 Nutritional Status: BMI > 30  Obese Nutritional Risks: None Diabetes: No  How often do you need to have someone help you when you read instructions, pamphlets, or other written materials from your doctor or pharmacy?: 1 - Never  Diabetic? no  Interpreter Needed?: No  Information entered by :: Ceria Suminski, LPN   Activities of Daily Living    11/06/2021    3:39 PM 11/02/2021    9:15 AM  In your present state of health, do you have any difficulty performing the following activities:  Hearing? 0 0  Vision? 0 0  Difficulty concentrating or making decisions? 0 0  Walking or climbing stairs? 0 0  Dressing or bathing? 0 0  Doing errands, shopping? 0 0  Preparing Food and eating ? N N  Using the Toilet? N N  In the past six months, have you accidently leaked urine? N N  Do you have problems with loss of bowel control? N N  Managing your Medications? N N  Managing your Finances? N N  Housekeeping or managing your Housekeeping? N N    Patient Care Team: Gabriel Earing, FNP as PCP - General (Family Medicine) Michaelle Copas, MD as Referring Physician (Optometry)  Indicate any recent Medical Services you may have received from other than [facility]  providers in the past year (date may be approximate).     Assessment:   This is a routine wellness examination for [facility]  Hearing/Vision screen Hearing Screening - Comments:: Denies hearing difficulties   Vision Screening - Comments:: Wears rx glasses - behind with routine eye exams with [facility] in [place]  Dietary issues and exercise activities discussed: Current Exercise Habits: Home exercise routine, Type of exercise: walking, Time (Minutes): 30, Frequency (Times/Week): 3, Weekly Exercise (Minutes/Week): 90, Intensity: Mild, Exercise limited by: orthopedic condition(s)   Goals Addressed             This Visit's Progress    COMPLETED: Patient Stated       11/05/2020 AWV Goal: Fall Prevention  Over the next year, patient will decrease their risk for falls by: Using assistive devices, such as a cane or walker, as needed Identifying fall risks within their home and correcting them by: Removing throw rugs Adding handrails to stairs or ramps Removing clutter and keeping a clear pathway throughout the home Increasing light, especially at night Adding shower handles/bars Raising toilet seat Identifying potential personal risk factors for falls: Medication side effects Incontinence/urgency Vestibular dysfunction Hearing loss Musculoskeletal disorders Neurological disorders Orthostatic hypotension       Patient Stated       Wants to lose weight (goal is 145#), get more active and healthy - meet people in community and be more socially active (CRR sent today for this)       Depression Screen    11/06/2021    3:44 PM 10/09/2021   10:40 AM 07/15/2021    8:07 AM 06/24/2021   12:02 PM 05/14/2021    8:22 AM 04/16/2021    8:11 AM 03/17/2021    8:48 AM  PHQ 2/9 Scores  PHQ - 2 Score  1 1 0 1 0 0 1  PHQ- 9 Score 2 2 1 3 1 1 3     Fall Risk    11/06/2021    3:18 PM 11/02/2021    9:15 AM 07/15/2021    8:07 AM 06/24/2021   12:01 PM 05/14/2021    8:21 AM  Fall Risk   Falls  in the past year? 0 0 1 1 1   Number falls in past yr: 0 0 1 1 1   Injury with Fall? 0 0 0 0 0  Risk for fall due to : Orthopedic patient  History of fall(s) History of fall(s) History of fall(s)  Follow up Falls prevention discussed  Falls evaluation completed  Falls evaluation completed    FALL RISK PREVENTION PERTAINING TO THE HOME:  Any stairs in or around the home? No  If so, are there any without handrails? No  Home free of loose throw rugs in walkways, pet beds, electrical cords, etc? Yes  Adequate lighting in your home to reduce risk of falls? Yes   ASSISTIVE DEVICES UTILIZED TO PREVENT FALLS:  Life alert? No  Use of a cane, walker or w/c? No  Grab bars in the bathroom? No  Shower chair or bench in shower? No  Elevated toilet seat or a handicapped toilet? No   TIMED UP AND GO:  Was the test performed? No . Telephonic visit  Cognitive Function:        11/06/2021    3:22 PM 11/05/2020    4:26 PM  6CIT Screen  What Year? 0 points 0 points  What month? 0 points 0 points  What time? 0 points 0 points  Count back from 20 0 points 0 points  Months in reverse 0 points 0 points  Repeat phrase 0 points 0 points  Total Score 0 points 0 points    Immunizations Immunization History  Administered Date(s) Administered   Influenza,inj,Quad PF,6+ Mos 03/26/2020, 03/17/2021   PFIZER(Purple Top)SARS-COV-2 Vaccination 09/15/2019, 10/06/2019, 05/05/2020   Pneumococcal Polysaccharide-23 08/04/2018    TDAP status: Up to date  Flu Vaccine status: Up to date  Pneumococcal vaccine status: Up to date  Covid-19 vaccine status: Completed vaccines  Qualifies for Shingles Vaccine? Yes   Zostavax completed Yes   Shingrix Completed?: Yes  Screening Tests Health Maintenance  Topic Date Due   DEXA SCAN  08/10/2020   MAMMOGRAM  08/18/2020   COLONOSCOPY (Pts 45-188yrs Insurance coverage will need to be confirmed)  11/18/2021 (Originally 02/15/2006)   COVID-19 Vaccine (4 - Booster for  Pfizer series) 12/04/2021 (Originally 06/30/2020)   Zoster Vaccines- Shingrix (1 of 2) 02/18/2022 (Originally 02/16/1980)   TETANUS/TDAP  04/16/2022 (Originally [birth year]/16/1980)   INFLUENZA VACCINE  01/06/2022   Hepatitis C Screening  Completed   HIV Screening  Completed   HPV VACCINES  Aged Out    Health Maintenance  Health Maintenance Due  Topic Date Due   DEXA SCAN  08/10/2020   MAMMOGRAM  08/18/2020    Colorectal cancer screening: Type of screening: Colonoscopy. [place] 2020 in [place]. Repeat every 5 years  Mammogram status: Completed 11/04/2021. Repeat every year  Bone Density status: Completed 08/11/2018. Results reflect: Bone density results: OSTEOPENIA. Repeat every 2 years.  Lung Cancer Screening: (Low Dose CT Chest recommended if Age 36-80 years, 30 pack-year currently smoking OR have quit w/in 15years.) does not qualify.  Additional Screening:  Hepatitis C Screening: does qualify; Completed 09/18/2014  Vision Screening: Recommended annual ophthalmology exams for early detection of glaucoma and  other disorders of the eye. Is the patient up to date with their annual eye exam?  No  Who is the provider or what is the name of the office in which the patient attends annual eye exams? [facility] [place] If pt is not established with a provider, would they like to be referred to a provider to establish care? No .   Dental Screening: Recommended annual dental exams for proper oral hygiene  Community Resource Referral / Chronic Care Management: CRR required this visit?  Yes   CCM required this visit?  No      Plan:     I have personally reviewed and noted the following in the patient's chart:   Medical and social history Use of alcohol, tobacco or illicit drugs  Current medications and supplements including opioid prescriptions.  Functional ability and status Nutritional status Physical activity Advanced directives List of other physicians Hospitalizations,  surgeries, and ER visits in previous 12 months Vitals Screenings to include cognitive, depression, and falls Referrals and appointments  In addition, I have reviewed and discussed with patient certain preventive protocols, quality metrics, and best practice recommendations. A written personalized care plan for preventive services as well as general preventive health recommendations were provided to patient.     [place] Constable, LPN   5/0/0938   Nurse Notes: CRR sent to inform patient of local groups to join to help her socialize more and improve her anxiety and depression.

## 2021-11-06 NOTE — Patient Instructions (Signed)
Hannah [name] , Thank you for taking time to come for your Medicare Wellness Visit. I appreciate your ongoing commitment to your health goals. Please review the following plan we discussed and let me know if I can assist you in the future.   Screening recommendations/referrals: Colonoscopy: Done in [place] around 2020 - Recommended repeat in 5 years (2025) Mammogram: Done 11/04/2021 - Repeat annually  Bone Density: Done 08/11/2018 - Repeat every 2 years  Recommended yearly ophthalmology/optometry visit for glaucoma screening and checkup Recommended yearly dental visit for hygiene and checkup  Vaccinations: Influenza vaccine: Done 03/17/2021 - Repeat annually Pneumococcal vaccine: Done 08/04/2018 - Get Prevnar at age 65 Tdap vaccine: Done - we need dates Shingles vaccine: Done - we need dates  Covid-19: Done 09/15/2019, 10/06/2019, & 05/05/2020  Advanced directives: Advance directive discussed with you today. Even though you declined this today, please call our office should you change your mind, and we can give you the proper paperwork for you to fill out.   Conditions/risks identified: Aim for 30 minutes of exercise or brisk walking, 6-8 glasses of water, and 5 servings of fruits and vegetables each day.   Next appointment: Follow up in one year for your annual wellness visit.   Preventive Care 40-64 Years, Female Preventive care refers to lifestyle choices and visits with your health care provider that can promote health and wellness. What does preventive care include? A yearly physical exam. This is also called an annual well check. Dental exams once or twice a year. Routine eye exams. Ask your health care provider how often you should have your eyes checked. Personal lifestyle choices, including: Daily care of your teeth and gums. Regular physical activity. Eating a healthy diet. Avoiding tobacco and drug use. Limiting alcohol use. Practicing safe sex. Taking low-dose aspirin daily  starting at age 50. Taking vitamin and mineral supplements as recommended by your health care provider. What happens during an annual well check? The services and screenings done by your health care provider during your annual well check will depend on your age, overall health, lifestyle risk factors, and family history of disease. Counseling  Your health care provider may ask you questions about your: Alcohol use. Tobacco use. Drug use. Emotional well-being. Home and relationship well-being. Sexual activity. Eating habits. Work and work environment. Method of birth control. Menstrual cycle. Pregnancy history. Screening  You may have the following tests or measurements: Height, weight, and BMI. Blood pressure. Lipid and cholesterol levels. These may be checked every 5 years, or more frequently if you are over 50 years old. Skin check. Lung cancer screening. You may have this screening every year starting at age 55 if you have a 30-pack-year history of smoking and currently smoke or have quit within the past 15 years. Fecal occult blood test (FOBT) of the stool. You may have this test every year starting at age 50. Flexible sigmoidoscopy or colonoscopy. You may have a sigmoidoscopy every 5 years or a colonoscopy every 10 years starting at age 50. Hepatitis C blood test. Hepatitis B blood test. Sexually transmitted disease (STD) testing. Diabetes screening. This is done by checking your blood sugar (glucose) after you have not eaten for a while (fasting). You may have this done every 1-3 years. Mammogram. This may be done every 1-2 years. Talk to your health care provider about when you should start having regular mammograms. This may depend on whether you have a family history of breast cancer. BRCA-related cancer screening. This may be   done if you have a family history of breast, ovarian, tubal, or peritoneal cancers. Pelvic exam and Pap test. This may be done every 3 years starting  at age 21. Starting at age 30, this may be done every 5 years if you have a Pap test in combination with an HPV test. Bone density scan. This is done to screen for osteoporosis. You may have this scan if you are at high risk for osteoporosis. Discuss your test results, treatment options, and if necessary, the need for more tests with your health care provider. Vaccines  Your health care provider may recommend certain vaccines, such as: Influenza vaccine. This is recommended every year. Tetanus, diphtheria, and acellular pertussis (Tdap, Td) vaccine. You may need a Td booster every 10 years. Zoster vaccine. You may need this after age 60. Pneumococcal 13-valent conjugate (PCV13) vaccine. You may need this if you have certain conditions and were not previously vaccinated. Pneumococcal polysaccharide (PPSV23) vaccine. You may need one or two doses if you smoke cigarettes or if you have certain conditions. Talk to your health care provider about which screenings and vaccines you need and how often you need them. This information is not intended to replace advice given to you by your health care provider. Make sure you discuss any questions you have with your health care provider. Document Released: 06/21/2015 Document Revised: 02/12/2016 Document Reviewed: 03/26/2015 Elsevier Interactive Patient Education  2017 [employer] Inc[employer]    Fall Prevention in the Home Falls can cause injuries. They can happen to people of all ages. There are many things you can do to make your home safe and to help prevent falls. What can I do on the outside of my home? Regularly fix the edges of walkways and driveways and fix any cracks. Remove anything that might make you trip as you walk through a door, such as a raised step or threshold. Trim any bushes or trees on the path to your home. Use bright outdoor lighting. Clear any walking paths of anything that might make someone trip, such as rocks or tools. Regularly check  to see if handrails are loose or broken. Make sure that both sides of any steps have handrails. Any raised decks and porches should have guardrails on the edges. Have any leaves, snow, or ice cleared regularly. Use sand or salt on walking paths during winter. Clean up any spills in your garage right away. This includes oil or grease spills. What can I do in the bathroom? Use night lights. Install grab bars by the toilet and in the tub and shower. Do not use towel bars as grab bars. Use non-skid mats or decals in the tub or shower. If you need to sit down in the shower, use a plastic, non-slip stool. Keep the floor dry. Clean up any water that spills on the floor as soon as it happens. Remove soap buildup in the tub or shower regularly. Attach bath mats securely with double-sided non-slip rug tape. Do not have throw rugs and other things on the floor that can make you trip. What can I do in the bedroom? Use night lights. Make sure that you have a light by your bed that is easy to reach. Do not use any sheets or blankets that are too big for your bed. They should not hang down onto the floor. Have a firm chair that has side arms. You can use this for support while you get dressed. Do not have throw rugs and other things on   the floor that can make you trip. What can I do in the kitchen? Clean up any spills right away. Avoid walking on wet floors. Keep items that you use a lot in easy-to-reach places. If you need to reach something above you, use a strong step stool that has a grab bar. Keep electrical cords out of the way. Do not use floor polish or wax that makes floors slippery. If you must use wax, use non-skid floor wax. Do not have throw rugs and other things on the floor that can make you trip. What can I do with my stairs? Do not leave any items on the stairs. Make sure that there are handrails on both sides of the stairs and use them. Fix handrails that are broken or loose. Make  sure that handrails are as long as the stairways. Check any carpeting to make sure that it is firmly attached to the stairs. Fix any carpet that is loose or worn. Avoid having throw rugs at the top or bottom of the stairs. If you do have throw rugs, attach them to the floor with carpet tape. Make sure that you have a light switch at the top of the stairs and the bottom of the stairs. If you do not have them, ask someone to add them for you. What else can I do to help prevent falls? Wear shoes that: Do not have high heels. Have rubber bottoms. Are comfortable and fit you well. Are closed at the toe. Do not wear sandals. If you use a stepladder: Make sure that it is fully opened. Do not climb a closed stepladder. Make sure that both sides of the stepladder are locked into place. Ask someone to hold it for you, if possible. Clearly mark and make sure that you can see: Any grab bars or handrails. First and last steps. Where the edge of each step is. Use tools that help you move around (mobility aids) if they are needed. These include: Canes. Walkers. Scooters. Crutches. Turn on the lights when you go into a dark area. Replace any light bulbs as soon as they burn out. Set up your furniture so you have a clear path. Avoid moving your furniture around. If any of your floors are uneven, fix them. If there are any pets around you, be aware of where they are. Review your medicines with your doctor. Some medicines can make you feel dizzy. This can increase your chance of falling. Ask your doctor what other things that you can do to help prevent falls. This information is not intended to replace advice given to you by your health care provider. Make sure you discuss any questions you have with your health care provider. Document Released: 03/21/2009 Document Revised: 10/31/2015 Document Reviewed: 06/29/2014 Elsevier Interactive Patient Education  2017 [employer] Inc[employer]  

## 2021-11-07 ENCOUNTER — Telehealth: Payer: Self-pay

## 2021-11-07 NOTE — Telephone Encounter (Signed)
   Telephone encounter was:  Successful.  11/07/2021 Name: Hannah [name] MRN: [id number] DOB: September 29, 1960  Hannah [name] is a 61 y.o. year old female who is a primary care patient of Gabriel Earing, FNP . The community resource team was consulted for assistance with  silver sneakers  Care guide performed the following interventions: Patient provided with information about care guide support team and interviewed to confirm resource needs.patients wants me to email her senior resource center information as well as silver sneakers information  Follow Up Plan:  Care guide will follow up with patient by phone over the next day    [facility] Guide, Embedded Care Coordination [facility], Care Management  [phone] [address] [place], [place], [place] [address] Phone: [phone] Email: [email or web address]

## 2021-11-10 ENCOUNTER — Telehealth: Payer: Self-pay

## 2021-11-10 NOTE — Telephone Encounter (Signed)
   Telephone encounter was:  Successful.  11/10/2021 Name: Hannah [name] MRN: [id number] DOB: [birth year]  Hannah [name] is a 61 y.o. year old female who is a primary care patient of Gabriel Earing, FNP . The community resource team was consulted for assistance with  silver sneakers  Care guide performed the following interventions: Patient provided with information about care guide support team and interviewed to confirm resource needs.Patient confirmed she received the emailed resources that was sent to her for silver sneakers    [facility] Guide, Embedded Care Coordination [facility], Care Management  [phone] [address] [place], [place], [place] [address] Phone: [phone] Email: [email or web address]

## 2021-12-17 ENCOUNTER — Other Ambulatory Visit: Payer: Self-pay | Admitting: Family Medicine

## 2021-12-17 DIAGNOSIS — F331 Major depressive disorder, recurrent, moderate: Secondary | ICD-10-CM

## 2021-12-25 ENCOUNTER — Other Ambulatory Visit: Payer: Self-pay | Admitting: Family Medicine

## 2021-12-25 DIAGNOSIS — K219 Gastro-esophageal reflux disease without esophagitis: Secondary | ICD-10-CM

## 2022-01-09 ENCOUNTER — Ambulatory Visit (INDEPENDENT_AMBULATORY_CARE_PROVIDER_SITE_OTHER): Payer: Medicare Other

## 2022-01-09 ENCOUNTER — Ambulatory Visit (INDEPENDENT_AMBULATORY_CARE_PROVIDER_SITE_OTHER): Payer: Medicare Other | Admitting: Family Medicine

## 2022-01-09 ENCOUNTER — Encounter: Payer: Self-pay | Admitting: Family Medicine

## 2022-01-09 VITALS — BP 91/59 | HR 76 | Temp 98.7°F | Ht 63.0 in | Wt 181.5 lb

## 2022-01-09 DIAGNOSIS — K219 Gastro-esophageal reflux disease without esophagitis: Secondary | ICD-10-CM | POA: Diagnosis not present

## 2022-01-09 DIAGNOSIS — F419 Anxiety disorder, unspecified: Secondary | ICD-10-CM | POA: Diagnosis not present

## 2022-01-09 DIAGNOSIS — M6740 Ganglion, unspecified site: Secondary | ICD-10-CM

## 2022-01-09 DIAGNOSIS — F3340 Major depressive disorder, recurrent, in remission, unspecified: Secondary | ICD-10-CM

## 2022-01-09 DIAGNOSIS — F5101 Primary insomnia: Secondary | ICD-10-CM

## 2022-01-09 DIAGNOSIS — Z79899 Other long term (current) drug therapy: Secondary | ICD-10-CM | POA: Diagnosis not present

## 2022-01-09 DIAGNOSIS — E7800 Pure hypercholesterolemia, unspecified: Secondary | ICD-10-CM | POA: Diagnosis not present

## 2022-01-09 DIAGNOSIS — M545 Low back pain, unspecified: Secondary | ICD-10-CM

## 2022-01-09 DIAGNOSIS — Z6832 Body mass index (BMI) 32.0-32.9, adult: Secondary | ICD-10-CM

## 2022-01-09 DIAGNOSIS — D509 Iron deficiency anemia, unspecified: Secondary | ICD-10-CM

## 2022-01-09 DIAGNOSIS — E6609 Other obesity due to excess calories: Secondary | ICD-10-CM | POA: Diagnosis not present

## 2022-01-09 DIAGNOSIS — M542 Cervicalgia: Secondary | ICD-10-CM | POA: Diagnosis not present

## 2022-01-09 DIAGNOSIS — G8929 Other chronic pain: Secondary | ICD-10-CM

## 2022-01-09 DIAGNOSIS — Z780 Asymptomatic menopausal state: Secondary | ICD-10-CM

## 2022-01-09 DIAGNOSIS — I959 Hypotension, unspecified: Secondary | ICD-10-CM

## 2022-01-09 DIAGNOSIS — M8589 Other specified disorders of bone density and structure, multiple sites: Secondary | ICD-10-CM | POA: Diagnosis not present

## 2022-01-09 MED ORDER — OXYCODONE HCL 10 MG PO TABS: 10.0000 mg | ORAL_TABLET | Freq: Three times a day (TID) | ORAL | 0 refills | Status: AC | PRN

## 2022-01-09 MED ORDER — SEMAGLUTIDE (1 MG/DOSE) 4 MG/3ML ~~LOC~~ SOPN
1.0000 mg | PEN_INJECTOR | SUBCUTANEOUS | 3 refills | Status: DC
Start: 2022-01-09 — End: 2022-03-30

## 2022-01-09 NOTE — Progress Notes (Signed)
Established Patient Office Visit  Subjective   Patient ID: Hannah [name], female    DOB: [birth year]  Age: 61 y.o. MRN: [id number]  Chief Complaint  Patient presents with   Medical Management of Chronic Issues    HPI  Obesity She has been maintaining a healthy diet. She is down another 17 lbs. She has started a silver sneakers class. She continues on ozempic and is tolerating this well.   2. Chronic neck and back pain She takes baclofen prn and celebrex daily.  Pain assessment: Cause of pain- old trauma from [place] accident Pain location- lower back and neck Pain on scale of 1-10- 6/10 without medications, 0/10 with medications Frequency- daily What increases pain-activity like house work or lifting objects What makes pain Better-resting and medication Effects on ADL - minimal to none Any change in general medical condition-none   Current opioids rx- Oxycodone 10 mg TID PRN # meds rx- 90 Effectiveness of current meds-good Adverse reactions from pain meds-none Morphine equivalent- 43.5 MME   Pill count performed-No Last drug screen - 10/09/21 Urine drug screen today- no Was the [place] reviewed- yes             If yes were their any concerning findings? - no CSA signed  - 10/09/21  3. GERD  Compliant with medications - Yes Current medications - protonix, pepcid daily DEXA if on PPI - DEXA scan ordered to evaluate the patient for osteoporosis. Cough - No Sore throat - No Voice change - No Hemoptysis - No Dysphagia or dyspepsia - No Water brash - No Red Flags (weight loss, hematochezia, melena, weight loss, early satiety, fevers, odynophagia, or persistent vomiting) - No  4. Depression/anxiety/insomnia Has been on Geodon and zoloft for years with good control. She is taking buspar TID. She takes trazodone 1.5 tablets for insomnia with good relief. She would like to try weaning of or down on some of her medications. She would like to try to wean both Geodon and  trazodone for now.      01/09/2022   10:02 AM 11/06/2021    3:44 PM 10/09/2021   10:40 AM  Depression screen PHQ 2/9  Decreased Interest 0 0 0  Down, Depressed, Hopeless 0 1 1  PHQ - 2 Score 0 1 1  Altered sleeping 0 0 0  Tired, decreased energy _0 Change in appetite 0 0 0  Feeling bad or failure about yourself  0 0 0  Trouble concentrating 0 0 0  Moving slowly or fidgety/restless 0 0 0  Suicidal thoughts 0 0 0  PHQ-9 Score _1 Difficult doing work/chores Not difficult at all Somewhat difficult Somewhat difficult       01/09/2022   10:02 AM 10/09/2021   10:40 AM 07/15/2021    8:08 AM 06/24/2021   12:02 PM  GAD 7 : Generalized Anxiety Score  Nervous, Anxious, on Edge 0 0 0 0  Control/stop worrying 0 _2 Worry too much - different things 0 1 0 1  Trouble relaxing 0 0 0 0  Restless 0 0 0 0  Easily annoyed or irritable 0 0 0 0  Afraid - awful might happen 0 0 0 0  Total GAD 7 Score 0 _3 Anxiety Difficulty Not difficult at all Not difficult at all Not difficult at all Somewhat difficult   5. Cyst on left elbow She has a large cyst on the inside of her left  elbow. It has been present for months. It has gotten a little large. It is not painful or tender. Denies exudate, erythema, or swelling. No injury.    Patient Active Problem List   Diagnosis Date Noted   Gastroesophageal reflux disease without esophagitis 10/09/2021   Depression, major, recurrent, in remission ([place]) 04/16/2021   Iron deficiency anemia 04/16/2021   Pure hypercholesterolemia 01/15/2021   Chronic neck pain 01/15/2021   Constipation 11/06/2020   Unilateral primary osteoarthritis, left knee 10/23/2020   Primary insomnia 07/15/2020   Controlled substance agreement signed 07/15/2020   Depression, recurrent ([place]) 07/15/2020   Generalized edema 07/15/2020   Morbid obesity ([place]) 01/15/2020   Chronic bilateral low back pain without sciatica 01/15/2020   Anxiety    Moderate episode of recurrent major  depressive disorder ([place]) 08/04/2017         01/09/2022   10:02 AM 11/06/2021    3:44 PM 10/09/2021   10:40 AM  Depression screen PHQ 2/9  Decreased Interest 0 0 0  Down, Depressed, Hopeless 0 1 1  PHQ - 2 Score 0 1 1  Altered sleeping 0 0 0  Tired, decreased energy _0 Change in appetite 0 0 0  Feeling bad or failure about yourself  0 0 0  Trouble concentrating 0 0 0  Moving slowly or fidgety/restless 0 0 0  Suicidal thoughts 0 0 0  PHQ-9 Score _1 Difficult doing work/chores Not difficult at all Somewhat difficult Somewhat difficult      01/09/2022   10:02 AM 10/09/2021   10:40 AM 07/15/2021    8:08 AM 06/24/2021   12:02 PM  GAD 7 : Generalized Anxiety Score  Nervous, Anxious, on Edge 0 0 0 0  Control/stop worrying 0 _2 Worry too much - different things 0 1 0 1  Trouble relaxing 0 0 0 0  Restless 0 0 0 0  Easily annoyed or irritable 0 0 0 0  Afraid - awful might happen 0 0 0 0  Total GAD 7 Score 0 _3 Anxiety Difficulty Not difficult at all Not difficult at all Not difficult at all Somewhat difficult   BP Readings from Last 3 Encounters:  01/09/22 (!) 91/59  10/23/21 101/66  10/09/21 99/61     ROS Negative unless specially indicated above in HPI.   Objective:     BP (!) 91/59   Pulse 76   Temp 98.7 F (37.1 C) (Temporal)   Ht _4  (1.6 m)   Wt 181 lb 8 oz (82.3 kg)   SpO2 97%   BMI 32.15 kg/m  Wt Readings from Last 3 Encounters:  01/09/22 181 lb 8 oz (82.3 kg)  11/06/21 197 lb (89.4 kg)  10/09/21 197 lb (89.4 kg)   BP Readings from Last 3 Encounters:  01/09/22 (!) 91/59  10/23/21 101/66  10/09/21 99/61    Physical Exam Vitals and nursing note reviewed.  Constitutional:      General: She is not in acute distress.    Appearance: She is not ill-appearing, toxic-appearing or diaphoretic.  HENT:     Head: Normocephalic and atraumatic.  Cardiovascular:     Rate and Rhythm: Normal rate and regular rhythm.     Heart sounds: Normal heart  sounds. No murmur heard. Pulmonary:     Effort: Pulmonary effort is normal. No respiratory distress.     Breath sounds: Normal breath sounds.  Abdominal:     General: Bowel  sounds are normal. There is no distension.     Palpations: Abdomen is soft.     Tenderness: There is no abdominal tenderness. There is no guarding or rebound.  Musculoskeletal:     Right lower leg: No edema.     Left lower leg: No edema.     Comments: Non tender cyst to left medial antecubital fossa. No erythema or fluctuance.   Skin:    General: Skin is warm and dry.  Neurological:     General: No focal deficit present.     Mental Status: She is alert and oriented to person, place, and time.     Motor: No weakness.     Gait: Gait normal.  Psychiatric:        Mood and Affect: Mood normal.        Behavior: Behavior normal.        Thought Content: Thought content normal.        Judgment: Judgment normal.      No results found for any visits on 01/09/22.  Last CBC Lab Results  Component Value Date   WBC 6.5 10/09/2021   HGB 12.6 10/09/2021   HCT 38.2 10/09/2021   MCV 94 10/09/2021   MCH 31.1 10/09/2021   RDW 12.0 10/09/2021   PLT 170 64/33/2951   Last metabolic panel Lab Results  Component Value Date   GLUCOSE 80 10/09/2021   NA 142 10/09/2021   K 4.1 10/09/2021   CL 104 10/09/2021   CO2 26 10/09/2021   BUN 11 10/09/2021   CREATININE 1.06 (H) 10/09/2021   EGFR 60 10/09/2021   CALCIUM 10.0 10/09/2021   PROT 6.5 10/09/2021   ALBUMIN 4.0 10/09/2021   LABGLOB 2.5 10/09/2021   AGRATIO 1.6 10/09/2021   BILITOT 0.4 10/09/2021   ALKPHOS 72 10/09/2021   AST 19 10/09/2021   ALT 13 10/09/2021   Last lipids Lab Results  Component Value Date   CHOL 181 10/09/2021   HDL 47 10/09/2021   LDLCALC 119 (H) 10/09/2021   TRIG 79 10/09/2021   CHOLHDL 3.9 10/09/2021   Last thyroid functions Lab Results  Component Value Date   TSH 2.930 07/15/2021   T4TOTAL 5.7 07/15/2021      The 10-year ASCVD  risk score (Arnett DK, et al., 2019) is: 1.8%    Assessment & Plan:   Smith was seen today for medical management of chronic issues.  Diagnoses and all orders for this visit:  Class 1 obesity due to excess calories with serious comorbidity and body mass index (BMI) of 32.0 to 32.9 in adult Down another 17 lbs over last 3 months with ozempic. Down a total of 111 lbs. Congratulated on this. Will decrease to 1 mg weekly on ozempic to improve appetite and food intake to slow weight loss since her BP is consistently low and she is often fatigued. Discussed increasing protein intake as well. Labs pending.  -     Semaglutide, 1 MG/DOSE, 4 MG/3ML SOPN; Inject 1 mg as directed once a week. -     CMP14+EGFR -     Lipid panel -     Anemia Profile B  Hypotension, unspecified hypotension type Labs pending. Will decrease ozempic to 1 mg to slow weight loss. Discussed hydration and diet.  -     CMP14+EGFR -     Anemia Profile B  Pure hypercholesterolemia Continue diet and exercise. Labs pending.  -     CMP14+EGFR -     Lipid panel  Iron deficiency anemia, unspecified iron deficiency anemia type On iron supplement. Labs pending.  -     Anemia Profile B  Gastroesophageal reflux disease without esophagitis On protonix and pepcid. Labs pending.  -     CMP14+EGFR -     Anemia Profile B  Depression, major, recurrent, in remission ([place]) Anxiety Well controlled. She would like to try weaning off geodon. Discussed slow wean. Decreased to 10 mg BID for 4 weeks. She will then message me to let me know how she is feeling.   Primary insomnia She would like to wean trazodone. Drop to 100 mg nightly. She will message me in 2 weeks to let me know how this goes.   Chronic neck pain Chronic bilateral low back pain without sciatica Controlled substance agreement signed Well controlled. PDMP reviewed, no red flags. CSA and UDS are UTD. Refills provided.  -     Oxycodone HCl 10 MG TABS; Take 1 tablet (10 mg  total) by mouth 3 (three) times daily as needed. -     Oxycodone HCl 10 MG TABS; Take 1 tablet (10 mg total) by mouth 3 (three) times daily as needed. -     Oxycodone HCl 10 MG TABS; Take 1 tablet (10 mg total) by mouth 3 (three) times daily as needed.  Ganglion cyst Left elbow. Referral to ortho.  -     Ambulatory referral to Orthopedic Surgery  Postmenopausal -     DG [facility] DEXA; Future  Return in about 3 months (around 04/11/2022) for chronic follow up, shedule appt with ortho at [facility].   The patient indicates understanding of these issues and agrees with the plan.  Gwenlyn Perking, FNP

## 2022-01-09 NOTE — Patient Instructions (Addendum)
Open Geodon capsule and use half the powder twice a day for the next 4 weeks. Then message me and let me know how you feel.   Start 1 tablet of trazodone nightly for 2 weeks. If you'd like to wean further at that point, message me and let me know.

## 2022-01-10 LAB — ANEMIA PROFILE B
Basophils Absolute: 0.1 10*3/uL (ref 0.0–0.2)
Basos: 1 %
EOS (ABSOLUTE): 0.1 10*3/uL (ref 0.0–0.4)
Eos: 2 %
Ferritin: 213 ng/mL — ABNORMAL HIGH (ref 15–150)
Folate: >20 ng/mL (ref >3.0)
Hematocrit: 37.3 % (ref 34.0–46.6)
Hemoglobin: 12.5 g/dL (ref 11.1–15.9)
Immature Grans (Abs): 0 10*3/uL (ref 0.0–0.1)
Immature Granulocytes: 0 %
Iron Saturation: 31 % (ref 15–55)
Iron: 86 ug/dL (ref 27–159)
Lymphocytes Absolute: 1.1 10*3/uL (ref 0.7–3.1)
Lymphs: 19 %
MCH: 32.3 pg (ref 26.6–33.0)
MCHC: 33.5 g/dL (ref 31.5–35.7)
MCV: 96 fL (ref 79–97)
Monocytes Absolute: 0.4 10*3/uL (ref 0.1–0.9)
Monocytes: 7 %
Neutrophils Absolute: 4.1 10*3/uL (ref 1.4–7.0)
Neutrophils: 71 %
Platelets: 148 10*3/uL — ABNORMAL LOW (ref 150–450)
RBC: 3.87 x10E6/uL (ref 3.77–5.28)
RDW: 12 % (ref 11.7–15.4)
Retic Ct Pct: 1.3 % (ref 0.6–2.6)
Total Iron Binding Capacity: 280 ug/dL (ref 250–450)
UIBC: 194 ug/dL (ref 131–425)
Vitamin B-12: 319 pg/mL (ref 232–1245)
WBC: 5.8 10*3/uL (ref 3.4–10.8)

## 2022-01-10 LAB — CMP14+EGFR
ALT: 24 IU/L (ref 0–32)
AST: 28 IU/L (ref 0–40)
Albumin/Globulin Ratio: 2.1 (ref 1.2–2.2)
Albumin: 4.1 g/dL (ref 3.8–4.9)
Alkaline Phosphatase: 85 IU/L (ref 44–121)
BUN/Creatinine Ratio: 7 — ABNORMAL LOW (ref 12–28)
BUN: 8 mg/dL (ref 8–27)
Bilirubin Total: 0.4 mg/dL (ref 0.0–1.2)
CO2: 25 mmol/L (ref 20–29)
Calcium: 10.2 mg/dL (ref 8.7–10.3)
Chloride: 105 mmol/L (ref 96–106)
Creatinine, Ser: 1.1 mg/dL — ABNORMAL HIGH (ref 0.57–1.00)
Globulin, Total: 2 g/dL (ref 1.5–4.5)
Glucose: 86 mg/dL (ref 70–99)
Potassium: 4.6 mmol/L (ref 3.5–5.2)
Sodium: 141 mmol/L (ref 134–144)
Total Protein: 6.1 g/dL (ref 6.0–8.5)
eGFR: 58 mL/min/{1.73_m2} — ABNORMAL LOW (ref >59)

## 2022-01-10 LAB — LIPID PANEL
Chol/HDL Ratio: 3.3 ratio (ref 0.0–4.4)
Cholesterol, Total: 169 mg/dL (ref 100–199)
HDL: 51 mg/dL (ref >39)
LDL Chol Calc (NIH): 107 mg/dL — ABNORMAL HIGH (ref 0–99)
Triglycerides: 56 mg/dL (ref 0–149)
VLDL Cholesterol Cal: 11 mg/dL (ref 5–40)

## 2022-01-15 ENCOUNTER — Ambulatory Visit (INDEPENDENT_AMBULATORY_CARE_PROVIDER_SITE_OTHER): Payer: Medicare Other | Admitting: Family Medicine

## 2022-01-15 ENCOUNTER — Encounter: Payer: Self-pay | Admitting: Family Medicine

## 2022-01-15 VITALS — BP 99/59 | HR 87 | Temp 98.2°F | Ht 63.0 in | Wt 177.5 lb

## 2022-01-15 DIAGNOSIS — J029 Acute pharyngitis, unspecified: Secondary | ICD-10-CM | POA: Diagnosis not present

## 2022-01-15 LAB — CULTURE, GROUP A STREP

## 2022-01-15 LAB — RAPID STREP SCREEN (MED CTR MEBANE ONLY): Strep Gp A Ag, IA W/Reflex: NEGATIVE

## 2022-01-15 MED ORDER — FLUTICASONE PROPIONATE 50 MCG/ACT NA SUSP: 2.0000 | Freq: Every day | NASAL | 6 refills | Status: DC

## 2022-01-15 NOTE — Patient Instructions (Signed)
Sore Throat A sore throat is pain, burning, irritation, or scratchiness in the throat. When you have a sore throat, you may feel pain or tenderness in your throat when you swallow or talk. Many things can cause a sore throat, including: An infection. Seasonal allergies. Dryness in the air. Irritants, such as smoke or pollution. Radiation treatment for cancer. Gastroesophageal reflux disease (GERD). A tumor. A sore throat is often the first sign of another sickness. It may happen with other symptoms, such as coughing, sneezing, fever, and swollen neck glands. Most sore throats go away without medical treatment. Follow these instructions at home:     Medicines Take over-the-counter and prescription medicines only as told by your health care provider. Children often get sore throats. Do not give your child aspirin because of the association with Reye's syndrome. Use throat sprays to soothe your throat as told by your health care provider. Managing pain To help with pain, try: Sipping warm liquids, such as broth, herbal tea, or warm water. Eating or drinking cold or frozen liquids, such as frozen ice pops. Gargling with a mixture of salt and water 3-4 times a day or as needed. To make salt water, completely dissolve -1 tsp (3-6 g) of salt in 1 cup (237 mL) of warm water. Sucking on hard candy or throat lozenges. Putting a cool-mist humidifier in your bedroom at night to moisten the air. Sitting in the bathroom with the door closed for 5-10 minutes while you run hot water in the shower. General instructions Do not use any products that contain nicotine or tobacco. These products include cigarettes, chewing tobacco, and vaping devices, such as e-cigarettes. If you need help quitting, ask your health care provider. Rest as needed. Drink enough fluid to keep your urine pale yellow. Wash your hands often with soap and water for at least 20 seconds. If soap and water are not available, use hand  sanitizer. Contact a health care provider if: You have a fever for more than 2-3 days. You have symptoms that last for more than 2-3 days. Your throat does not get better within 7 days. You have a fever and your symptoms suddenly get worse. Get help right away if: You have difficulty breathing. You cannot swallow fluids, soft foods, or your saliva. You have increased swelling in your throat or neck. You have persistent nausea and vomiting. These symptoms may represent a serious problem that is an emergency. Do not wait to see if the symptoms will go away. Get medical help right away. Call your local emergency services (911 in the [place] Do not drive yourself to the hospital. Summary A sore throat is pain, burning, irritation, or scratchiness in the throat. Many things can cause a sore throat. Take over-the-counter medicines only as told by your health care provider. Rest as needed. Drink enough fluid to keep your urine pale yellow. Contact a health care provider if your throat does not get better within 7 days. This information is not intended to replace advice given to you by your health care provider. Make sure you discuss any questions you have with your health care provider. Document Revised: 08/21/2020 Document Reviewed: 08/21/2020 Elsevier Patient Education  2023 Elsevier Inc.  

## 2022-01-15 NOTE — Progress Notes (Signed)
Acute Office Visit  Subjective:     Patient ID: Hannah [name]. Anger, female    DOB: 06/17/1960, 61 y.o.   MRN: [id number]  Chief Complaint  Patient presents with   Sore Throat    Sore Throat  This is a new problem. The current episode started in the past 7 days. The problem has been gradually worsening. The pain is worse on the right side. There has been no fever. The pain is moderate. Associated symptoms include abdominal pain (mild cramping) and swollen glands. Pertinent negatives include no congestion, coughing, diarrhea, drooling, ear discharge, ear pain, headaches, hoarse voice, plugged ear sensation, neck pain, shortness of breath, stridor, trouble swallowing or vomiting. Associated symptoms comments: rhinorrhea. She has had no exposure to strep. She has tried nothing for the symptoms.    Review of Systems  Constitutional:  Negative for chills and fever.  HENT:  Positive for sore throat. Negative for congestion, drooling, ear discharge, ear pain, hoarse voice, sinus pain and trouble swallowing.   Respiratory:  Negative for cough, shortness of breath, wheezing and stridor.   Cardiovascular:  Negative for chest pain.  Gastrointestinal:  Positive for abdominal pain (mild cramping). Negative for diarrhea, heartburn, nausea and vomiting.  Musculoskeletal:  Negative for neck pain.  Neurological:  Negative for headaches.       Objective:    BP (!) 99/59   Pulse 87   Temp 98.2 F (36.8 C) (Temporal)   Ht 5\' 3"  (1.6 m)   Wt 177 lb 8 oz (80.5 kg)   SpO2 99%   BMI 31.44 kg/m    Physical Exam Vitals and nursing note reviewed.  Constitutional:      General: She is not in acute distress.    Appearance: She is not ill-appearing, toxic-appearing or diaphoretic.  HENT:     Head: Normocephalic and atraumatic.     Right Ear: Tympanic membrane and ear canal normal.     Left Ear: Tympanic membrane and ear canal normal.     Nose: Rhinorrhea present.     Mouth/Throat:     Mouth:  Mucous membranes are moist. No oral lesions.     Pharynx: Posterior oropharyngeal erythema present. No pharyngeal swelling or oropharyngeal exudate.     Tonsils: No tonsillar exudate or tonsillar abscesses. 1+ on the right. 1+ on the left.  Eyes:     Extraocular Movements:     Right eye: Normal extraocular motion.     Left eye: Normal extraocular motion.     Conjunctiva/sclera: Conjunctivae normal.  Cardiovascular:     Rate and Rhythm: Normal rate and regular rhythm.     Heart sounds: Normal heart sounds. No murmur heard. Pulmonary:     Effort: Pulmonary effort is normal.     Breath sounds: Normal breath sounds.  Abdominal:     General: There is no distension.     Palpations: Abdomen is soft.     Tenderness: There is no abdominal tenderness. There is no guarding or rebound.  Musculoskeletal:     Cervical back: Neck supple.  Lymphadenopathy:     Cervical: No cervical adenopathy.  Skin:    General: Skin is warm and dry.  Neurological:     Mental Status: She is alert and oriented to person, place, and time.     No results found for any visits on 01/15/22.      Assessment & Plan:   Rawan was seen today for sore throat.  Diagnoses and all orders for this visit:  Sore throat With rhinorrhea. Negative rapid strep today. Flonase as below. Discussed symptomatic care and return precautions.  -     Rapid Strep Screen ([facility] ONLY) -     fluticasone (FLONASE) 50 MCG/ACT nasal spray; Place 2 sprays into both nostrils daily.   Return if symptoms worsen or fail to improve.  The patient indicates understanding of these issues and agrees with the plan.  Gabriel Earing, FNP

## 2022-01-29 ENCOUNTER — Encounter: Payer: Self-pay | Admitting: Family Medicine

## 2022-02-09 ENCOUNTER — Other Ambulatory Visit: Payer: Self-pay | Admitting: Family Medicine

## 2022-02-09 DIAGNOSIS — G8929 Other chronic pain: Secondary | ICD-10-CM

## 2022-02-16 ENCOUNTER — Ambulatory Visit: Payer: Medicare Other | Admitting: Orthopedic Surgery

## 2022-02-16 ENCOUNTER — Encounter: Payer: Self-pay | Admitting: Family Medicine

## 2022-02-16 DIAGNOSIS — M545 Low back pain, unspecified: Secondary | ICD-10-CM

## 2022-02-18 MED ORDER — CELECOXIB 100 MG PO CAPS: 100.0000 mg | ORAL_CAPSULE | Freq: Every day | ORAL | 1 refills | Status: DC

## 2022-02-19 DIAGNOSIS — Z23 Encounter for immunization: Secondary | ICD-10-CM | POA: Diagnosis not present

## 2022-02-24 ENCOUNTER — Encounter: Payer: Self-pay | Admitting: Family Medicine

## 2022-03-04 ENCOUNTER — Ambulatory Visit (INDEPENDENT_AMBULATORY_CARE_PROVIDER_SITE_OTHER): Payer: Medicare Other

## 2022-03-04 ENCOUNTER — Ambulatory Visit (INDEPENDENT_AMBULATORY_CARE_PROVIDER_SITE_OTHER): Payer: Medicare Other | Admitting: Orthopedic Surgery

## 2022-03-04 VITALS — Ht 63.0 in | Wt 184.0 lb

## 2022-03-04 DIAGNOSIS — M25522 Pain in left elbow: Secondary | ICD-10-CM

## 2022-03-04 DIAGNOSIS — R2232 Localized swelling, mass and lump, left upper limb: Secondary | ICD-10-CM | POA: Diagnosis not present

## 2022-03-05 ENCOUNTER — Encounter: Payer: Self-pay | Admitting: Orthopedic Surgery

## 2022-03-05 NOTE — Progress Notes (Signed)
New Patient Visit  Assessment: Hannah [name] is a 61 y.o. female with the following: 1. Mass of elbow region, left  Plan: Hannah [name] has swelling on the medial aspect of the left elbow.  Occasionally it will be painful.  She does note that it has changed slightly in consistency.  She states that it has been there for a few months, but also notes that she recently lost 100 pounds, appropriately.  It is possible that this has been there for longer, and she simply did not notice.  Nonetheless, it is not causing her a lot of symptoms.  We discussed warning signs, and if she is concerned, she will return to clinic to discuss this further.  Nothing further needed at this time.  Follow-up: Return if symptoms worsen or fail to improve.  Subjective:  Chief Complaint  Patient presents with   Arm Pain    Lt inner arm nodule for 6 mos. Pt states it has gotten larger and a more firm.     History of Present Illness: Hannah [name] is a 62 y.o. female who has been referred by Harlow Mares, FNP for evaluation of left elbow swelling.  She notes a growth on the medial aspect of the left elbow.  This is just proximal to the [facility] fossa.  It is been there for at least 6 months.  She feels as though it has gotten bigger recently.  Consistency has also changed.  She denies overlying skin changes.  Her health has remained stable.  Of note, she states that she has had significant weight loss, which was planned.  She thinks that this may have been there earlier, but she could not appreciated.  No numbness or tingling.   Review of Systems: No fevers or chills No numbness or tingling No chest pain No shortness of breath No bowel or bladder dysfunction No GI distress No headaches   Medical History:  Past Medical History:  Diagnosis Date   Acquired dilation of bile duct    Alcohol abuse    Anemia 06/27/2000   Anxiety    Arthritis 05/14/1998   Blood type O+    Chronic neck and back pain     Compulsive skin picking 1998   Depression    Drug abuse (HCC)    GERD (gastroesophageal reflux disease)    History of iron deficiency anemia    Hyperlipidemia    Insomnia    Migraines    Osteoporosis 07/08/2017   T-score -2.6 in 2019; -2.2 on 08/05/2018   Vitamin D deficiency     Past Surgical History:  Procedure Laterality Date   ABDOMINAL HYSTERECTOMY  09/07/2006   CESAREAN SECTION  02/16/1990   CHOLECYSTECTOMY  2001   ENDOMETRIAL ABLATION W/ NOVASURE     GASTRIC BYPASS  1998   HEMORRHOID SURGERY  2003   HERNIA REPAIR  2004   JOINT REPLACEMENT  08/21/2016   KNEE ARTHROSCOPY Right 2008   MOUTH SURGERY     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    Family History  Problem Relation Age of Onset   Anxiety disorder Mother    Depression Mother    Breast cancer Mother    Ovarian cancer Mother    Obesity Mother    Alcohol abuse Father    Heart disease Father    Kidney disease Brother    Heart disease Brother    Breast cancer Maternal Aunt    Breast cancer Maternal Grandmother    Breast cancer Maternal  Grandfather    Social History   Tobacco Use   Smoking status: Former    Packs/day: 1.00    Types: Cigarettes    Quit date: 06/08/1989    Years since quitting: 32.7   Smokeless tobacco: Never  Vaping Use   Vaping Use: Never used  Substance Use Topics   Alcohol use: Not Currently   Drug use: Not Currently    Allergies  Allergen Reactions   Demerol [Meperidine] Other (See Comments)    disorientation    Other Other (See Comments)    Disoriented and nausea   Latex Other (See Comments)    blisters   Morphine Palpitations    SLOW HEART RATE    Sulfa Antibiotics Rash    No outpatient medications have been marked as taking for the 03/04/22 encounter (Office Visit) with Mordecai Rasmussen, MD.    Objective: Ht 5\' 3"  (1.6 m)   Wt 184 lb (83.5 kg)   BMI 32.59 kg/m   Physical Exam:  General: Alert and oriented. and No acute distress. Gait: Normal gait.  Left  forearm without obvious deformity.  No atrophy is appreciated.  She has full and painless range motion of the left arm.  There is a small, approximately 2 cm in diameter area of growth within the medial aspect of the upper arm.  There are no overlying skin changes.  Negative Tinels at the growth.  She tolerates gentle palpation.  IMAGING: I personally ordered and reviewed the following images  X-rays of the left elbow were obtained in clinic today.  No acute injuries noted.  Well-maintained joint space within the elbow.  The elbow was appropriately reduced.  No evidence of recent trauma.  No calcifications within the soft tissue.  Mild soft tissue swelling over the anterior elbow.  Impression: Negative left elbow x-ray   New Medications:  No orders of the defined types were placed in this encounter.     Mordecai Rasmussen, MD  03/05/2022 1:33 PM

## 2022-03-25 ENCOUNTER — Other Ambulatory Visit: Payer: Self-pay | Admitting: Family Medicine

## 2022-03-25 DIAGNOSIS — F419 Anxiety disorder, unspecified: Secondary | ICD-10-CM

## 2022-03-29 ENCOUNTER — Other Ambulatory Visit: Payer: Self-pay | Admitting: Family Medicine

## 2022-03-29 DIAGNOSIS — F331 Major depressive disorder, recurrent, moderate: Secondary | ICD-10-CM

## 2022-03-30 ENCOUNTER — Other Ambulatory Visit: Payer: Self-pay | Admitting: Family Medicine

## 2022-03-30 ENCOUNTER — Encounter: Payer: Self-pay | Admitting: Family Medicine

## 2022-03-30 DIAGNOSIS — E6609 Other obesity due to excess calories: Secondary | ICD-10-CM

## 2022-03-30 MED ORDER — SEMAGLUTIDE (2 MG/DOSE) 8 MG/3ML ~~LOC~~ SOPN: 2.0000 mg | PEN_INJECTOR | SUBCUTANEOUS | 1 refills | Status: DC

## 2022-04-04 ENCOUNTER — Other Ambulatory Visit: Payer: Self-pay | Admitting: Family Medicine

## 2022-04-04 DIAGNOSIS — K219 Gastro-esophageal reflux disease without esophagitis: Secondary | ICD-10-CM

## 2022-04-10 ENCOUNTER — Ambulatory Visit: Payer: Medicare Other | Admitting: Family Medicine

## 2022-04-10 ENCOUNTER — Telehealth (INDEPENDENT_AMBULATORY_CARE_PROVIDER_SITE_OTHER): Payer: Medicare Other | Admitting: Family Medicine

## 2022-04-10 ENCOUNTER — Encounter: Payer: Self-pay | Admitting: Family Medicine

## 2022-04-10 DIAGNOSIS — M545 Low back pain, unspecified: Secondary | ICD-10-CM

## 2022-04-10 DIAGNOSIS — G8929 Other chronic pain: Secondary | ICD-10-CM | POA: Diagnosis not present

## 2022-04-10 DIAGNOSIS — Z87828 Personal history of other (healed) physical injury and trauma: Secondary | ICD-10-CM

## 2022-04-10 MED ORDER — OXYCODONE HCL 10 MG PO TABS: 10.0000 mg | ORAL_TABLET | Freq: Three times a day (TID) | ORAL | 0 refills | Status: DC | PRN

## 2022-04-10 NOTE — Progress Notes (Signed)
MyChart Video visit  Subjective: CC: Chronic pain management PCP: Gwenlyn Perking, FNP DEY:CXKG R. Mowers is a 61 y.o. female. Patient provides verbal consent for consult held via video.  Due to COVID-19 pandemic this visit was conducted virtually. This visit type was conducted due to national recommendations for restrictions regarding the COVID-19 Pandemic (e.g. social distancing, sheltering in place) in an effort to limit this patient's exposure and mitigate transmission in our community. All issues noted in this document were discussed and addressed.  A physical exam was not performed with this format.   Location of patient: work Location of provider: [facility] Others present for call: none  1. Chronic pain management Patient reports chronic low back pain after hitting a steel pole on a forklift back in 2010.  She saw a spinal specialist at that time but she declined fusion of the spine and has been managed with pain meds ever since.  Previously treated with vicodin and switched to oxycodone.  She uses it consistently every 8 hours.  She was on 200mg  of Celebrex and now is down to 100mg .  This helps a lot in keeping her mobile/ functional.  With meds her pain level is a 3/10.  Without med 8/10.  She reports constipation is well controlled with miralax.  No drowsiness or falls.  No GI bleeding, nausea, or vomiting.  No lower extremity Numbness, tingling, weakness. No saddle anesthesia, urinary retention or fecal incontinence.   ROS: Per HPI  Allergies  Allergen Reactions   Demerol [Meperidine] Other (See Comments)    disorientation    Other Other (See Comments)    Disoriented and nausea   Latex Other (See Comments)    blisters   Morphine Palpitations    SLOW HEART RATE    Sulfa Antibiotics Rash   Past Medical History:  Diagnosis Date   Acquired dilation of bile duct    Alcohol abuse    Anemia 06/27/2000   Anxiety    Arthritis 05/14/1998   Blood type O+    Chronic neck and back  pain    Compulsive skin picking 1998   Depression    Drug abuse ([place])    GERD (gastroesophageal reflux disease)    History of iron deficiency anemia    Hyperlipidemia    Insomnia    Migraines    Osteoporosis 07/08/2017   T-score -2.6 in 2019; -2.2 on 08/05/2018   Vitamin D deficiency     Current Outpatient Medications:    B Complex-C (SUPER B COMPLEX PO), Take 1 tablet by mouth daily., Disp: , Rfl:    baclofen (LIORESAL) 10 MG tablet, Take 1 tablet (10 mg total) by mouth 3 (three) times daily as needed. for muscle spams, Disp: 90 tablet, Rfl: 5   busPIRone (BUSPAR) 15 MG tablet, TAKE 1 TABLET BY MOUTH IN THE MORNING AND  1  BY  MOUTH  AT  NOON  AND  1  BY  MOUTH  AT  BEDTIME, Disp: 320 tablet, Rfl: 3   celecoxib (CELEBREX) 100 MG capsule, Take 1 capsule (100 mg total) by mouth daily., Disp: 90 capsule, Rfl: 1   Cholecalciferol (VITAMIN D3) 125 MCG (5000 UT) CAPS, Take 1 capsule by mouth daily., Disp: , Rfl:    CINNAMON PO, Take by mouth., Disp: , Rfl:    diphenhydrAMINE (BENADRYL) 25 mg capsule, Take 50 mg by mouth daily as needed., Disp: , Rfl:    famotidine (PEPCID) 20 MG tablet, Take 20 mg by mouth daily., Disp: ,  Rfl:    fluticasone (FLONASE) 50 MCG/ACT nasal spray, Place 2 sprays into both nostrils daily., Disp: 16 g, Rfl: 6   hydrOXYzine (ATARAX) 10 MG tablet, Take 1 tablet (10 mg total) by mouth 3 (three) times daily as needed., Disp: 90 tablet, Rfl: 11   naloxone (NARCAN) nasal spray 4 mg/0.1 mL, Use nasally for overdose, Disp: 1 each, Rfl: 1   pantoprazole (PROTONIX) 20 MG tablet, Take 1 tablet by mouth once daily, Disp: 90 tablet, Rfl: 0   Semaglutide, 2 MG/DOSE, 8 MG/3ML SOPN, Inject 2 mg as directed once a week., Disp: 9 mL, Rfl: 1   sertraline (ZOLOFT) 100 MG tablet, TAKE 1 TABLET BY MOUTH IN THE MORNING AND AT BEDTIME, Disp: 180 tablet, Rfl: 0   traZODone (DESYREL) 100 MG tablet, Take 2 tablets (200 mg total) by mouth at bedtime., Disp: 180 tablet, Rfl: 3   ziprasidone  (GEODON) 20 MG capsule, Take 1 capsule (20 mg total) by mouth in the morning and at bedtime., Disp: 180 capsule, Rfl: 1  Gen: Well-appearing, nontoxic female no acute distress HEENT: Sclera white. Neuro: Responds to questioning appropriately. Psych: Mood stable, speech normal.  Good eye contact.  Pleasant and interactive  Assessment/ Plan: 61 y.o. female   Chronic bilateral low back pain without sciatica - Plan: Oxycodone HCl 10 MG TABS  History of back injury - Plan: Oxycodone HCl 10 MG TABS  Encounter for chronic pain management - Plan: Oxycodone HCl 10 MG TABS  Encounter for pain management today as her PCP is out sick.  Chronic pain is controlled.  Total 45 MME/ day. Candidate for fusion but is declining at this time so continue to manage with pain management as outlined by PCP.  I reviewed her history in detail today.  She demonstrates no red flag signs or symptoms.  We discussed red flag signs and symptoms would be and that this would warrant immediate medical attention in the ER.  She is up-to-date on UDS and CSC.  She has an appointment scheduled with her PCP next week.  Keep follow-up for ongoing management of pain.  Continue bowel regimen for prevention of constipation.  Start time: 10:26am End time: 10:35a  Total time spent on patient care (including video visit/ documentation): 9 minutes  Raylin Winer Hulen Skains, DO [facility] [phone]

## 2022-04-16 ENCOUNTER — Encounter: Payer: Self-pay | Admitting: Family Medicine

## 2022-04-16 ENCOUNTER — Ambulatory Visit (INDEPENDENT_AMBULATORY_CARE_PROVIDER_SITE_OTHER): Payer: Medicare Other | Admitting: Family Medicine

## 2022-04-16 VITALS — BP 100/64 | HR 71 | Temp 98.4°F | Ht 63.0 in | Wt 178.6 lb

## 2022-04-16 DIAGNOSIS — F5101 Primary insomnia: Secondary | ICD-10-CM | POA: Diagnosis not present

## 2022-04-16 DIAGNOSIS — Z6832 Body mass index (BMI) 32.0-32.9, adult: Secondary | ICD-10-CM | POA: Diagnosis not present

## 2022-04-16 DIAGNOSIS — K219 Gastro-esophageal reflux disease without esophagitis: Secondary | ICD-10-CM

## 2022-04-16 DIAGNOSIS — D509 Iron deficiency anemia, unspecified: Secondary | ICD-10-CM

## 2022-04-16 DIAGNOSIS — E6609 Other obesity due to excess calories: Secondary | ICD-10-CM | POA: Diagnosis not present

## 2022-04-16 DIAGNOSIS — M542 Cervicalgia: Secondary | ICD-10-CM

## 2022-04-16 DIAGNOSIS — M545 Low back pain, unspecified: Secondary | ICD-10-CM

## 2022-04-16 DIAGNOSIS — F419 Anxiety disorder, unspecified: Secondary | ICD-10-CM

## 2022-04-16 DIAGNOSIS — E66811 Obesity, class 1: Secondary | ICD-10-CM | POA: Insufficient documentation

## 2022-04-16 DIAGNOSIS — G8929 Other chronic pain: Secondary | ICD-10-CM | POA: Diagnosis not present

## 2022-04-16 DIAGNOSIS — Z79899 Other long term (current) drug therapy: Secondary | ICD-10-CM

## 2022-04-16 DIAGNOSIS — E7800 Pure hypercholesterolemia, unspecified: Secondary | ICD-10-CM | POA: Diagnosis not present

## 2022-04-16 DIAGNOSIS — F331 Major depressive disorder, recurrent, moderate: Secondary | ICD-10-CM | POA: Diagnosis not present

## 2022-04-16 MED ORDER — OXYCODONE HCL 10 MG PO TABS: 10.0000 mg | ORAL_TABLET | Freq: Three times a day (TID) | ORAL | 0 refills | Status: DC | PRN

## 2022-04-16 MED ORDER — OXYCODONE HCL 10 MG PO TABS: 10.0000 mg | ORAL_TABLET | Freq: Three times a day (TID) | ORAL | 0 refills | Status: AC | PRN

## 2022-04-16 NOTE — Progress Notes (Signed)
Established Patient Office Visit  Subjective   Patient ID: Hannah [name], female    DOB: Dec 30, 1960  Age: 61 y.o. MRN: [id number]  Chief Complaint  Patient presents with   Medical Management of Chronic Issues    HPI  Obesity She has been maintaining a healthy diet. She is down another 6 lbs. She has started a silver sneakers class. She continues on ozempic and is tolerating this well. Ozempic was decreased to 1 mg at her last visit. She gained some weight back and then lost it again. The dosage has been increased back to 2 mg but this is currently backordered.   2. Chronic neck and back pain She takes baclofen prn and celebrex daily.  Pain assessment: Cause of pain- old trauma from [place] accident Pain location- lower back and neck Pain on scale of 1-10- 8/10 without medications, 3/10 with medications Frequency- daily What increases pain-activity like house work or lifting objects What makes pain Better-resting and medication Any change in general medical condition-none   Current opioids rx- Oxycodone 10 mg TID PRN # meds rx- 90 Effectiveness of current meds-good Adverse reactions from pain meds-none Morphine equivalent- 43.5 MME   Pill count performed-No Last drug screen - 10/09/21 Urine drug screen today- no Was the [place] reviewed- yes             If yes were their any concerning findings? - no CSA signed  - 10/09/21  3. GERD  Compliant with medications - Yes Current medications - protonix, pepcid daily Cough - No Sore throat - No Voice change - No Hemoptysis - No Dysphagia or dyspepsia - No Water brash - No Red Flags (weight loss, hematochezia, melena, weight loss, early satiety, fevers, odynophagia, or persistent vomiting) - No She has been having some abdominal pain after eating for the last few days.   4. Depression/anxiety/insomnia She has cut down to 20 mg of geodon a day and would love to continue weaning off this. She is taking 150 mg of trazodone with  good relief of insomnia. Continues on buspar TID and zoloft BID.      04/16/2022   10:32 AM 01/09/2022   10:02 AM 11/06/2021    3:44 PM  Depression screen PHQ 2/9  Decreased Interest 0 0 0  Down, Depressed, Hopeless 0 0 1  PHQ - 2 Score 0 0 1  Altered sleeping 1 0 0  Tired, decreased energy _0 Change in appetite 0 0 0  Feeling bad or failure about yourself  0 0 0  Trouble concentrating 0 0 0  Moving slowly or fidgety/restless 0 0 0  Suicidal thoughts 0 0 0  PHQ-9 Score _1 Difficult doing work/chores Not difficult at all Not difficult at all Somewhat difficult       04/16/2022   10:32 AM 01/09/2022   10:02 AM 10/09/2021   10:40 AM 07/15/2021    8:08 AM  GAD 7 : Generalized Anxiety Score  Nervous, Anxious, on Edge 0 0 0 0  Control/stop worrying 0 0 1 1  Worry too much - different things 0 0 1 0  Trouble relaxing 0 0 0 0  Restless 0 0 0 0  Easily annoyed or irritable 0 0 0 0  Afraid - awful might happen 0 0 0 0  Total GAD 7 Score 0 0 2 1  Anxiety Difficulty Not difficult at all Not difficult at all Not difficult at all Not difficult at all  Patient Active Problem List   Diagnosis Date Noted   Gastroesophageal reflux disease without esophagitis 10/09/2021   Depression, major, recurrent, in remission ([place]) 04/16/2021   Iron deficiency anemia 04/16/2021   Pure hypercholesterolemia 01/15/2021   Chronic neck pain 01/15/2021   Constipation 11/06/2020   Unilateral primary osteoarthritis, left knee 10/23/2020   Primary insomnia 07/15/2020   Controlled substance agreement signed 07/15/2020   Depression, recurrent ([place]) 07/15/2020   Generalized edema 07/15/2020   Morbid obesity ([place]) 01/15/2020   Chronic bilateral low back pain without sciatica 01/15/2020   Anxiety    Moderate episode of recurrent major depressive disorder ([place]) 08/04/2017         04/16/2022   10:32 AM 01/09/2022   10:02 AM 11/06/2021    3:44 PM  Depression screen PHQ 2/9  Decreased Interest 0 0 0   Down, Depressed, Hopeless 0 0 1  PHQ - 2 Score 0 0 1  Altered sleeping 1 0 0  Tired, decreased energy _0 Change in appetite 0 0 0  Feeling bad or failure about yourself  0 0 0  Trouble concentrating 0 0 0  Moving slowly or fidgety/restless 0 0 0  Suicidal thoughts 0 0 0  PHQ-9 Score _1 Difficult doing work/chores Not difficult at all Not difficult at all Somewhat difficult      04/16/2022   10:32 AM 01/09/2022   10:02 AM 10/09/2021   10:40 AM 07/15/2021    8:08 AM  GAD 7 : Generalized Anxiety Score  Nervous, Anxious, on Edge 0 0 0 0  Control/stop worrying 0 0 1 1  Worry too much - different things 0 0 1 0  Trouble relaxing 0 0 0 0  Restless 0 0 0 0  Easily annoyed or irritable 0 0 0 0  Afraid - awful might happen 0 0 0 0  Total GAD 7 Score 0 0 2 1  Anxiety Difficulty Not difficult at all Not difficult at all Not difficult at all Not difficult at all   BP Readings from Last 3 Encounters:  04/16/22 100/64  01/15/22 (!) 99/59  01/09/22 (!) 91/59     ROS Negative unless specially indicated above in HPI.   Objective:     BP 100/64   Pulse 71   Temp 98.4 F (36.9 C)   Ht _2  (1.6 m)   Wt 178 lb 9.6 oz (81 kg)   SpO2 97%   BMI 31.64 kg/m  Wt Readings from Last 3 Encounters:  04/16/22 178 lb 9.6 oz (81 kg)  03/04/22 184 lb (83.5 kg)  01/15/22 177 lb 8 oz (80.5 kg)   BP Readings from Last 3 Encounters:  04/16/22 100/64  01/15/22 (!) 99/59  01/09/22 (!) 91/59    Physical Exam Vitals and nursing note reviewed.  Constitutional:      General: She is not in acute distress.    Appearance: She is not ill-appearing, toxic-appearing or diaphoretic.  HENT:     Head: Normocephalic and atraumatic.  Cardiovascular:     Rate and Rhythm: Normal rate and regular rhythm.     Heart sounds: Normal heart sounds. No murmur heard. Pulmonary:     Effort: Pulmonary effort is normal. No respiratory distress.     Breath sounds: Normal breath sounds.  Abdominal:      General: Bowel sounds are normal. There is no distension.     Palpations: Abdomen is soft.     Tenderness: There is no  abdominal tenderness. There is no guarding or rebound.  Musculoskeletal:     Right lower leg: No edema.     Left lower leg: No edema.  Skin:    General: Skin is warm and dry.  Neurological:     General: No focal deficit present.     Mental Status: She is alert and oriented to person, place, and time.     Motor: No weakness.     Gait: Gait normal.  Psychiatric:        Mood and Affect: Mood normal.        Behavior: Behavior normal.        Thought Content: Thought content normal.        Judgment: Judgment normal.      No results found for any visits on 04/16/22.  Last CBC Lab Results  Component Value Date   WBC 5.8 01/09/2022   HGB 12.5 01/09/2022   HCT 37.3 01/09/2022   MCV 96 01/09/2022   MCH 32.3 01/09/2022   RDW 12.0 01/09/2022   PLT 148 (L) 12/45/8099   Last metabolic panel Lab Results  Component Value Date   GLUCOSE 86 01/09/2022   NA 141 01/09/2022   K 4.6 01/09/2022   CL 105 01/09/2022   CO2 25 01/09/2022   BUN 8 01/09/2022   CREATININE 1.10 (H) 01/09/2022   EGFR 58 (L) 01/09/2022   CALCIUM 10.2 01/09/2022   PROT 6.1 01/09/2022   ALBUMIN 4.1 01/09/2022   LABGLOB 2.0 01/09/2022   AGRATIO 2.1 01/09/2022   BILITOT 0.4 01/09/2022   ALKPHOS 85 01/09/2022   AST 28 01/09/2022   ALT 24 01/09/2022   Last lipids Lab Results  Component Value Date   CHOL 169 01/09/2022   HDL 51 01/09/2022   LDLCALC 107 (H) 01/09/2022   TRIG 56 01/09/2022   CHOLHDL 3.3 01/09/2022   Last thyroid functions Lab Results  Component Value Date   TSH 2.930 07/15/2021   T4TOTAL 5.7 07/15/2021      The 10-year ASCVD risk score (Arnett DK, et al., 2019) is: 2.1%    Assessment & Plan:   Othell was seen today for medical management of chronic issues.  Diagnoses and all orders for this visit:  Class 1 obesity due to excess calories with serious comorbidity  and body mass index (BMI) of 32.0 to 32.9 in adult Doing great with ozempic, diet, and exercise. BMI down to 31 now. Will recheck renal function today.  -     Cancel: BMP8+EGFR -     BMP8+EGFR; Future  Pure hypercholesterolemia Last LDL at 107. The 10-year ASCVD risk score (Arnett DK, et al., 2019) is: 2.1%. continue diet and exercise.   Gastroesophageal reflux disease without esophagitis Increase pepcid to bid for pain after eating. Continue protonix. No red flags. Return to office for new or worsening symptoms, or if symptoms persist.   Iron deficiency anemia, unspecified iron deficiency anemia type Continue iron supplement. Well controlled.   Chronic bilateral low back pain without sciatica Chronic neck pain Controlled substance agreement signed Well controlled. PDMP reviewed, no red flags. CSA and UDS are UTD. Refills provided.   -     Oxycodone HCl 10 MG TABS; Take 1 tablet (10 mg total) by mouth 3 (three) times daily as needed (severe pain). -     Oxycodone HCl 10 MG TABS; Take 1 tablet (10 mg total) by mouth 3 (three) times daily as needed (severe pain).  Moderate episode of recurrent major depressive disorder (HCC)  Anxiety Primary insomnia Well controlled on current regimen. Discussed slow wean from geodon, followed by wean from trazodone if she would like. Continue buspar and zoloft.     Return in about 3 months (around 07/11/2022) for CPE.   The patient indicates understanding of these issues and agrees with the plan.  Gwenlyn Perking, FNP

## 2022-04-16 NOTE — Patient Instructions (Signed)
Try famodtidine twice a day for stomach pain.   Decrease geodone to every other day for 2 weeks, then twice a week for 2 weeks, then stop taking.   After you of off geodon for a few weeks, it is ok to wean down to 100 mg of trazodone. After 1 week of this, you can further decrease to 50 mg if you would like.

## 2022-04-20 ENCOUNTER — Ambulatory Visit: Payer: Medicare Other | Admitting: Orthopedic Surgery

## 2022-04-30 ENCOUNTER — Other Ambulatory Visit: Payer: Self-pay | Admitting: Family Medicine

## 2022-04-30 DIAGNOSIS — G8929 Other chronic pain: Secondary | ICD-10-CM

## 2022-05-13 DIAGNOSIS — Z23 Encounter for immunization: Secondary | ICD-10-CM | POA: Diagnosis not present

## 2022-06-02 ENCOUNTER — Other Ambulatory Visit: Payer: Self-pay | Admitting: Family Medicine

## 2022-06-02 DIAGNOSIS — G8929 Other chronic pain: Secondary | ICD-10-CM

## 2022-06-03 ENCOUNTER — Other Ambulatory Visit: Payer: Self-pay | Admitting: Family Medicine

## 2022-06-03 DIAGNOSIS — F331 Major depressive disorder, recurrent, moderate: Secondary | ICD-10-CM

## 2022-06-26 ENCOUNTER — Other Ambulatory Visit: Payer: Self-pay | Admitting: Family Medicine

## 2022-06-28 ENCOUNTER — Other Ambulatory Visit: Payer: Self-pay | Admitting: Family Medicine

## 2022-06-28 DIAGNOSIS — G8929 Other chronic pain: Secondary | ICD-10-CM

## 2022-07-02 ENCOUNTER — Telehealth: Payer: Self-pay

## 2022-07-02 NOTE — Telephone Encounter (Signed)
Hannah [name] (Key: [id number]) Rx #: [id number] Ozempic (2 MG/DOSE) 8MG [name] pen-injectors Form [employer] Electronic PA Form Created 24 hours ago Sent to Plan 3 minutes ago Plan Response 3 minutes ago Submit Clinical Questions less than a minute ago Determination Wait for Determination Please wait for [facility] NCPDP 2017 to return a determination.

## 2022-07-03 ENCOUNTER — Other Ambulatory Visit: Payer: Self-pay | Admitting: Family Medicine

## 2022-07-03 DIAGNOSIS — K219 Gastro-esophageal reflux disease without esophagitis: Secondary | ICD-10-CM

## 2022-07-03 NOTE — Telephone Encounter (Signed)
Unfortunately Medicare will not cover any medications for weight loss and will only cover ozempic for Type 2 DM now. I can place a referral to medical weight management if she would like? I recommend spacing out injections to every 114 days of the ozempic that she has left. I also recommend decreasing her dose to 1 mg per injection as well if she has any left.

## 2022-07-03 NOTE — Telephone Encounter (Signed)
Patient said her insurance denied, wants to know what else she can do. Please call back

## 2022-07-03 NOTE — Telephone Encounter (Signed)
Mills Koller (Key: [id number]) Rx #: [id number] Ozempic (2 MG/DOSE) 8MG [name] pen-injectors    Please advise what another option is

## 2022-07-03 NOTE — Telephone Encounter (Signed)
Patient aware and verbalizes understanding. States that she will let us know about referral at a later time if she decides she wants it.

## 2022-07-10 ENCOUNTER — Encounter: Payer: Self-pay | Admitting: Family Medicine

## 2022-07-10 ENCOUNTER — Ambulatory Visit (INDEPENDENT_AMBULATORY_CARE_PROVIDER_SITE_OTHER): Payer: Medicare Other | Admitting: Family Medicine

## 2022-07-10 VITALS — BP 93/60 | HR 84 | Temp 97.8°F | Ht 63.0 in | Wt 176.5 lb

## 2022-07-10 DIAGNOSIS — E6609 Other obesity due to excess calories: Secondary | ICD-10-CM | POA: Diagnosis not present

## 2022-07-10 DIAGNOSIS — E7800 Pure hypercholesterolemia, unspecified: Secondary | ICD-10-CM | POA: Diagnosis not present

## 2022-07-10 DIAGNOSIS — F331 Major depressive disorder, recurrent, moderate: Secondary | ICD-10-CM | POA: Diagnosis not present

## 2022-07-10 DIAGNOSIS — K219 Gastro-esophageal reflux disease without esophagitis: Secondary | ICD-10-CM | POA: Diagnosis not present

## 2022-07-10 DIAGNOSIS — D509 Iron deficiency anemia, unspecified: Secondary | ICD-10-CM

## 2022-07-10 DIAGNOSIS — M545 Low back pain, unspecified: Secondary | ICD-10-CM

## 2022-07-10 DIAGNOSIS — G8929 Other chronic pain: Secondary | ICD-10-CM | POA: Diagnosis not present

## 2022-07-10 DIAGNOSIS — Z79899 Other long term (current) drug therapy: Secondary | ICD-10-CM

## 2022-07-10 DIAGNOSIS — F419 Anxiety disorder, unspecified: Secondary | ICD-10-CM

## 2022-07-10 DIAGNOSIS — M542 Cervicalgia: Secondary | ICD-10-CM

## 2022-07-10 DIAGNOSIS — Z6832 Body mass index (BMI) 32.0-32.9, adult: Secondary | ICD-10-CM | POA: Diagnosis not present

## 2022-07-10 MED ORDER — OXYCODONE HCL 10 MG PO TABS: 10.0000 mg | ORAL_TABLET | Freq: Three times a day (TID) | ORAL | 0 refills | Status: AC | PRN

## 2022-07-10 MED ORDER — OXYCODONE HCL 10 MG PO TABS: 10.0000 mg | ORAL_TABLET | Freq: Three times a day (TID) | ORAL | 0 refills | Status: DC | PRN

## 2022-07-10 NOTE — Progress Notes (Signed)
Established Patient Office Visit  Subjective   Patient ID: Hannah [name], female    DOB: 03-Feb-1961  Age: 62 y.o. MRN: [id number]  Chief Complaint  Patient presents with   Medical Management of Chronic Issues    HPI  Obesity It has been 2 weeks since she has had an ozempic dosage. She reports that she continues to do well with her diet. She has lost an additional 2 lbs since her last visit. She would like to lose another 20 lbs.   2. HLD Diet controlled.   3. Chronic neck and back pain She takes baclofen prn and celebrex daily.  Pain assessment: Cause of pain- old trauma from [place] accident Pain location- lower back and neck Pain on scale of 1-10- 8/10 without medications, 3/10 with medications Frequency- daily What increases pain-activity like house work or lifting objects What makes pain Better-resting and medication Any change in general medical condition-none   Current opioids rx- Oxycodone 10 mg TID PRN # meds rx- 90 Effectiveness of current meds-good Adverse reactions from pain meds-none Morphine equivalent- 43.5 MME   Pill count performed-No Last drug screen - 10/09/21 Urine drug screen today- no Was the [facility] reviewed- yes             If yes were their any concerning findings? - no CSA signed  - 10/09/21  3. GERD  Compliant with medications - Yes Current medications - protonix, pepcid daily Cough - No Sore throat - No Voice change - No Hemoptysis - No Dysphagia or dyspepsia - No Water brash - No Red Flags (weight loss, hematochezia, melena, weight loss, early satiety, fevers, odynophagia, or persistent vomiting) - No She has been having some abdominal pain after eating for the last few days.   4. Depression/anxiety/insomnia She is taking 150 mg of trazodone with good relief of insomnia. Continues on buspar TID and zoloft BID. She has weaned herself of of geodon and has been doing well without this.      04/16/2022   10:32 AM 01/09/2022   10:02 AM  11/06/2021    3:44 PM  Depression screen PHQ 2/9  Decreased Interest 0 0 0  Down, Depressed, Hopeless 0 0 1  PHQ - 2 Score 0 0 1  Altered sleeping 1 0 0  Tired, decreased energy 1 1 1   Change in appetite 0 0 0  Feeling bad or failure about yourself  0 0 0  Trouble concentrating 0 0 0  Moving slowly or fidgety/restless 0 0 0  Suicidal thoughts 0 0 0  PHQ-9 Score 2 1 2   Difficult doing work/chores Not difficult at all Not difficult at all Somewhat difficult       04/16/2022   10:32 AM 01/09/2022   10:02 AM 10/09/2021   10:40 AM 07/15/2021    8:08 AM  GAD 7 : Generalized Anxiety Score  Nervous, Anxious, on Edge 0 0 0 0  Control/stop worrying 0 0 1 1  Worry too much - different things 0 0 1 0  Trouble relaxing 0 0 0 0  Restless 0 0 0 0  Easily annoyed or irritable 0 0 0 0  Afraid - awful might happen 0 0 0 0  Total GAD 7 Score 0 0 2 1  Anxiety Difficulty Not difficult at all Not difficult at all Not difficult at all Not difficult at all      Patient Active Problem List   Diagnosis Date Noted   Class 1 obesity due to  excess calories with serious comorbidity and body mass index (BMI) of 32.0 to 32.9 in adult 04/16/2022   Gastroesophageal reflux disease without esophagitis 10/09/2021   Depression, major, recurrent, in remission ([place]) 04/16/2021   Iron deficiency anemia 04/16/2021   Pure hypercholesterolemia 01/15/2021   Chronic neck pain 01/15/2021   Constipation 11/06/2020   Unilateral primary osteoarthritis, left knee 10/23/2020   Primary insomnia 07/15/2020   Controlled substance agreement signed 07/15/2020   Depression, recurrent ([place]) 07/15/2020   Generalized edema 07/15/2020   Morbid obesity ([place]) 01/15/2020   Chronic bilateral low back pain without sciatica 01/15/2020   Anxiety    Moderate episode of recurrent major depressive disorder ([place]) 08/04/2017         04/16/2022   10:32 AM 01/09/2022   10:02 AM 11/06/2021    3:44 PM  Depression screen PHQ 2/9  Decreased  Interest 0 0 0  Down, Depressed, Hopeless 0 0 1  PHQ - 2 Score 0 0 1  Altered sleeping 1 0 0  Tired, decreased energy 1 1 1   Change in appetite 0 0 0  Feeling bad or failure about yourself  0 0 0  Trouble concentrating 0 0 0  Moving slowly or fidgety/restless 0 0 0  Suicidal thoughts 0 0 0  PHQ-9 Score 2 1 2   Difficult doing work/chores Not difficult at all Not difficult at all Somewhat difficult      04/16/2022   10:32 AM 01/09/2022   10:02 AM 10/09/2021   10:40 AM 07/15/2021    8:08 AM  GAD 7 : Generalized Anxiety Score  Nervous, Anxious, on Edge 0 0 0 0  Control/stop worrying 0 0 1 1  Worry too much - different things 0 0 1 0  Trouble relaxing 0 0 0 0  Restless 0 0 0 0  Easily annoyed or irritable 0 0 0 0  Afraid - awful might happen 0 0 0 0  Total GAD 7 Score 0 0 2 1  Anxiety Difficulty Not difficult at all Not difficult at all Not difficult at all Not difficult at all   BP Readings from Last 3 Encounters:  07/10/22 93/60  04/16/22 100/64  01/15/22 (!) 99/59     ROS Negative unless specially indicated above in HPI.   Objective:     BP 93/60   Pulse 84   Temp 97.8 F (36.6 C) (Temporal)   Ht 5\' 3"  (1.6 m)   Wt 176 lb 8 oz (80.1 kg)   SpO2 96%   BMI 31.27 kg/m  Wt Readings from Last 3 Encounters:  07/10/22 176 lb 8 oz (80.1 kg)  04/16/22 178 lb 9.6 oz (81 kg)  03/04/22 184 lb (83.5 kg)   BP Readings from Last 3 Encounters:  07/10/22 93/60  04/16/22 100/64  01/15/22 (!) 99/59    Physical Exam Vitals and nursing note reviewed.  Constitutional:      General: She is not in acute distress.    Appearance: She is not ill-appearing, toxic-appearing or diaphoretic.  HENT:     Head: Normocephalic and atraumatic.  Cardiovascular:     Rate and Rhythm: Normal rate and regular rhythm.     Heart sounds: Normal heart sounds. No murmur heard. Pulmonary:     Effort: Pulmonary effort is normal. No respiratory distress.     Breath sounds: Normal breath sounds.   Abdominal:     General: Bowel sounds are normal. There is no distension.     Palpations: Abdomen is soft.  Tenderness: There is no abdominal tenderness. There is no guarding or rebound.  Musculoskeletal:     Right lower leg: No edema.     Left lower leg: No edema.  Skin:    General: Skin is warm and dry.  Neurological:     General: No focal deficit present.     Mental Status: She is alert and oriented to person, place, and time.     Motor: No weakness.     Gait: Gait normal.  Psychiatric:        Mood and Affect: Mood normal.        Behavior: Behavior normal.        Thought Content: Thought content normal.        Judgment: Judgment normal.      No results found for any visits on 07/10/22.  Last CBC Lab Results  Component Value Date   WBC 5.8 01/09/2022   HGB 12.5 01/09/2022   HCT 37.3 01/09/2022   MCV 96 01/09/2022   MCH 32.3 01/09/2022   RDW 12.0 01/09/2022   PLT 148 (L) 01/09/2022   Last metabolic panel Lab Results  Component Value Date   GLUCOSE 86 01/09/2022   NA 141 01/09/2022   K 4.6 01/09/2022   CL 105 01/09/2022   CO2 25 01/09/2022   BUN 8 01/09/2022   CREATININE 1.10 (H) 01/09/2022   EGFR 58 (L) 01/09/2022   CALCIUM 10.2 01/09/2022   PROT 6.1 01/09/2022   ALBUMIN 4.1 01/09/2022   LABGLOB 2.0 01/09/2022   AGRATIO 2.1 01/09/2022   BILITOT 0.4 01/09/2022   ALKPHOS 85 01/09/2022   AST 28 01/09/2022   ALT 24 01/09/2022   Last lipids Lab Results  Component Value Date   CHOL 169 01/09/2022   HDL 51 01/09/2022   LDLCALC 107 (H) 01/09/2022   TRIG 56 01/09/2022   CHOLHDL 3.3 01/09/2022   Last thyroid functions Lab Results  Component Value Date   TSH 2.930 07/15/2021   T4TOTAL 5.7 07/15/2021      The 10-year ASCVD risk score (Arnett DK, et al., 2019) is: 1.8%    Assessment & Plan:   Hannah [name] was seen today for medical management of chronic issues.  Diagnoses and all orders for this visit:  Class 1 obesity due to excess calories with  serious comorbidity and body mass index (BMI) of 32.0 to 32.9 in adult Continuing to lose weight. Discussed diet and exercise. Referral to nutrition placed today.  -     CMP14+EGFR -     Amb ref to Medical Nutrition Therapy-MNT  Iron deficiency anemia, unspecified iron deficiency anemia type On iron supplement. CBC was normal on last labs.   Pure hypercholesterolemia Diet controlled. Last LDL was 107  Gastroesophageal reflux disease without esophagitis Well controlled on current regimen.   Moderate episode of recurrent major depressive disorder (HCC) Anxiety Well controlled on current regimen. Has weaned off geodon with good control.   Chronic neck pain Chronic bilateral low back pain without sciatica Controlled substance agreement signed Well controlled on current regimen. PDMP reviewed, no red flags. CSA and UDS are UTD. Discussed trying 7.5 mg TID at next visit since she has lost a significant amount of weight. Refills provided.  -     Oxycodone HCl 10 MG TABS; Take 1 tablet (10 mg total) by mouth 3 (three) times daily as needed (severe pain). -     Oxycodone HCl 10 MG TABS; Take 1 tablet (10 mg total) by mouth 3 (three) times  daily as needed (severe pain). -     Oxycodone HCl 10 MG TABS; Take 1 tablet (10 mg total) by mouth 3 (three) times daily as needed (severe pain).  Return in about 3 months (around 10/08/2022).   The patient indicates understanding of these issues and agrees with the plan.  Gwenlyn Perking, FNP

## 2022-07-11 LAB — CMP14+EGFR
ALT: 19 IU/L (ref 0–32)
AST: 24 IU/L (ref 0–40)
Albumin/Globulin Ratio: 1.9 (ref 1.2–2.2)
Albumin: 4.3 g/dL (ref 3.9–4.9)
Alkaline Phosphatase: 94 IU/L (ref 44–121)
BUN/Creatinine Ratio: 16 (ref 12–28)
BUN: 20 mg/dL (ref 8–27)
Bilirubin Total: 0.3 mg/dL (ref 0.0–1.2)
CO2: 25 mmol/L (ref 20–29)
Calcium: 10.1 mg/dL (ref 8.7–10.3)
Chloride: 105 mmol/L (ref 96–106)
Creatinine, Ser: 1.28 mg/dL — ABNORMAL HIGH (ref 0.57–1.00)
Globulin, Total: 2.3 g/dL (ref 1.5–4.5)
Glucose: 71 mg/dL (ref 70–99)
Potassium: 4.4 mmol/L (ref 3.5–5.2)
Sodium: 144 mmol/L (ref 134–144)
Total Protein: 6.6 g/dL (ref 6.0–8.5)
eGFR: 48 mL/min/{1.73_m2} — ABNORMAL LOW (ref >59)

## 2022-08-01 ENCOUNTER — Other Ambulatory Visit: Payer: Self-pay | Admitting: Family Medicine

## 2022-08-01 DIAGNOSIS — G8929 Other chronic pain: Secondary | ICD-10-CM

## 2022-08-03 ENCOUNTER — Other Ambulatory Visit: Payer: Self-pay | Admitting: Family Medicine

## 2022-08-03 DIAGNOSIS — M545 Low back pain, unspecified: Secondary | ICD-10-CM

## 2022-08-04 ENCOUNTER — Ambulatory Visit: Payer: Medicare Other | Admitting: Nutrition

## 2022-08-06 ENCOUNTER — Encounter: Payer: Self-pay | Admitting: Radiology

## 2022-08-12 ENCOUNTER — Telehealth: Payer: Self-pay | Admitting: Family Medicine

## 2022-08-12 NOTE — Telephone Encounter (Signed)
Pt advised to contact pharmacy and ask them if they changed something to verify it is the correct medication and pt voiced understanding.

## 2022-09-28 ENCOUNTER — Other Ambulatory Visit: Payer: Self-pay | Admitting: Family Medicine

## 2022-09-28 DIAGNOSIS — K219 Gastro-esophageal reflux disease without esophagitis: Secondary | ICD-10-CM

## 2022-09-28 DIAGNOSIS — G8929 Other chronic pain: Secondary | ICD-10-CM

## 2022-10-06 ENCOUNTER — Other Ambulatory Visit: Payer: Self-pay | Admitting: Family Medicine

## 2022-10-06 DIAGNOSIS — Z1231 Encounter for screening mammogram for malignant neoplasm of breast: Secondary | ICD-10-CM

## 2022-10-08 ENCOUNTER — Encounter: Payer: Self-pay | Admitting: Family Medicine

## 2022-10-08 ENCOUNTER — Ambulatory Visit (INDEPENDENT_AMBULATORY_CARE_PROVIDER_SITE_OTHER): Payer: Medicare Other | Admitting: Family Medicine

## 2022-10-08 VITALS — BP 101/61 | HR 64 | Temp 98.2°F | Ht 63.0 in | Wt 195.1 lb

## 2022-10-08 DIAGNOSIS — M545 Low back pain, unspecified: Secondary | ICD-10-CM | POA: Diagnosis not present

## 2022-10-08 DIAGNOSIS — F5101 Primary insomnia: Secondary | ICD-10-CM | POA: Diagnosis not present

## 2022-10-08 DIAGNOSIS — F424 Excoriation (skin-picking) disorder: Secondary | ICD-10-CM

## 2022-10-08 DIAGNOSIS — J3489 Other specified disorders of nose and nasal sinuses: Secondary | ICD-10-CM

## 2022-10-08 DIAGNOSIS — M542 Cervicalgia: Secondary | ICD-10-CM | POA: Diagnosis not present

## 2022-10-08 DIAGNOSIS — Z79899 Other long term (current) drug therapy: Secondary | ICD-10-CM

## 2022-10-08 DIAGNOSIS — E6609 Other obesity due to excess calories: Secondary | ICD-10-CM | POA: Diagnosis not present

## 2022-10-08 DIAGNOSIS — F419 Anxiety disorder, unspecified: Secondary | ICD-10-CM

## 2022-10-08 DIAGNOSIS — G8929 Other chronic pain: Secondary | ICD-10-CM

## 2022-10-08 DIAGNOSIS — F339 Major depressive disorder, recurrent, unspecified: Secondary | ICD-10-CM

## 2022-10-08 DIAGNOSIS — K219 Gastro-esophageal reflux disease without esophagitis: Secondary | ICD-10-CM

## 2022-10-08 DIAGNOSIS — M5412 Radiculopathy, cervical region: Secondary | ICD-10-CM

## 2022-10-08 DIAGNOSIS — Z6834 Body mass index (BMI) 34.0-34.9, adult: Secondary | ICD-10-CM

## 2022-10-08 DIAGNOSIS — L0390 Cellulitis, unspecified: Secondary | ICD-10-CM | POA: Diagnosis not present

## 2022-10-08 DIAGNOSIS — E7800 Pure hypercholesterolemia, unspecified: Secondary | ICD-10-CM

## 2022-10-08 LAB — BMP8+EGFR: CO2: 24 mmol/L (ref 20–29)

## 2022-10-08 MED ORDER — OXYCODONE HCL 10 MG PO TABS
10.0000 mg | ORAL_TABLET | Freq: Three times a day (TID) | ORAL | 0 refills | Status: DC | PRN
Start: 2022-12-07 — End: 2022-12-09

## 2022-10-08 MED ORDER — OXYCODONE HCL 10 MG PO TABS
10.0000 mg | ORAL_TABLET | Freq: Three times a day (TID) | ORAL | 0 refills | Status: AC | PRN
Start: 2022-10-08 — End: 2022-11-07

## 2022-10-08 MED ORDER — MUPIROCIN 2 % EX OINT
1.0000 | TOPICAL_OINTMENT | Freq: Two times a day (BID) | CUTANEOUS | 0 refills | Status: AC
Start: 2022-10-08 — End: 2022-10-15

## 2022-10-08 MED ORDER — SULFAMETHOXAZOLE-TRIMETHOPRIM 800-160 MG PO TABS
1.0000 | ORAL_TABLET | Freq: Two times a day (BID) | ORAL | 0 refills | Status: DC
Start: 2022-10-08 — End: 2022-10-12

## 2022-10-08 MED ORDER — CELECOXIB 100 MG PO CAPS
100.0000 mg | ORAL_CAPSULE | Freq: Every day | ORAL | 0 refills | Status: DC
Start: 2022-10-08 — End: 2023-01-08

## 2022-10-08 MED ORDER — OXYCODONE HCL 10 MG PO TABS
10.0000 mg | ORAL_TABLET | Freq: Three times a day (TID) | ORAL | 0 refills | Status: AC | PRN
Start: 2022-11-07 — End: 2022-12-07

## 2022-10-08 MED ORDER — FLUOXETINE HCL 40 MG PO CAPS
40.0000 mg | ORAL_CAPSULE | Freq: Every day | ORAL | 0 refills | Status: DC
Start: 2022-10-08 — End: 2022-12-09

## 2022-10-08 NOTE — Progress Notes (Signed)
Established Patient Office Visit  Subjective   Patient ID: Hannah [name], female    DOB: 1960-09-24  Age: 62 y.o. MRN: [id number]  Chief Complaint  Patient presents with   Medical Management of Chronic Issues    HPI  Obesity She has gained weight since her last appt. She has found herself eating when she isn't hungry. She is upset with herself for gaining weight back.    2. Shoulder pain Ache in left shoulder that radiates down left arm for the last few weeks. She sometimes has sharp, shooting pain down the left arm. Also has had increased neck pain. She was previously on celebrex but no longer is. Denies recent injury.   3. Chronic neck and back pain She takes baclofen prn. Pain assessment: Cause of pain- old trauma from [place] accident Pain location- lower back and neck Pain on scale of 1-10- 8/10 without medications, 5/10 with medications Frequency- daily What increases pain-activity like house work or lifting objects What makes pain Better-resting and medication Any change in general medical condition-none   Current opioids rx- Oxycodone 10 mg TID PRN # meds rx- 90 Effectiveness of current meds-good Adverse reactions from pain meds-none Morphine equivalent- 45 MME   Pill count performed-No Last drug screen - 10/09/21 Urine drug screen today- no Was the [facility] reviewed- yes             If yes were their any concerning findings? - no CSA signed  - 10/09/21  3. GERD  Compliant with medications - Yes Current medications - protonix, pepcid prn Cough - No Sore throat - No Voice change - No Hemoptysis - No Dysphagia or dyspepsia - No dysphagia. She has dyspepsia if she doesn't take medications.  Water brash - No Red Flags (weight loss, hematochezia, melena, weight loss, early satiety, fevers, odynophagia, or persistent vomiting) - No  4. Depression/anxiety/insomnia Currently on trazodone, zolfot, and buspar. Not well controlled. She has been picking her skin  compulsively.  She now has sores on her abdomen, thighs, inside her nose. Some of these area has had purulent drainage. Purulent drainage from nose. Denies fever. She is sleeping well with trazodone.      10/08/2022    8:01 AM 07/10/2022    3:08 PM 04/16/2022   10:32 AM  Depression screen PHQ 2/9  Decreased Interest 0 0 0  Down, Depressed, Hopeless 0 0 0  PHQ - 2 Score 0 0 0  Altered sleeping 0 0 1  Tired, decreased energy 1 0 1  Change in appetite 2 0 0  Feeling bad or failure about yourself  0 0 0  Trouble concentrating 0 0 0  Moving slowly or fidgety/restless 0 0 0  Suicidal thoughts 0 0 0  PHQ-9 Score 3 0 2  Difficult doing work/chores Not difficult at all Not difficult at all Not difficult at all       10/08/2022    8:01 AM 07/10/2022    3:08 PM 04/16/2022   10:32 AM 01/09/2022   10:02 AM  GAD 7 : Generalized Anxiety Score  Nervous, Anxious, on Edge 0 0 0 0  Control/stop worrying 0 0 0 0  Worry too much - different things 1 0 0 0  Trouble relaxing 0 0 0 0  Restless 0 0 0 0  Easily annoyed or irritable 0 0 0 0  Afraid - awful might happen 0 0 0 0  Total GAD 7 Score 1 0 0 0  Anxiety Difficulty Not  difficult at all Not difficult at all Not difficult at all Not difficult at all      Patient Active Problem List   Diagnosis Date Noted   Class 1 obesity due to excess calories with serious comorbidity and body mass index (BMI) of 32.0 to 32.9 in adult 04/16/2022   Gastroesophageal reflux disease without esophagitis 10/09/2021   Depression, major, recurrent, in remission (HCC) 04/16/2021   Iron deficiency anemia 04/16/2021   Pure hypercholesterolemia 01/15/2021   Chronic neck pain 01/15/2021   Constipation 11/06/2020   Unilateral primary osteoarthritis, left knee 10/23/2020   Primary insomnia 07/15/2020   Controlled substance agreement signed 07/15/2020   Depression, recurrent (HCC) 07/15/2020   Generalized edema 07/15/2020   Morbid obesity (HCC) 01/15/2020   Chronic  bilateral low back pain without sciatica 01/15/2020   Anxiety    Moderate episode of recurrent major depressive disorder (HCC) 08/04/2017    ROS Negative unless specially indicated above in HPI.   Objective:     BP 101/61   Pulse 64   Temp 98.2 F (36.8 C) (Temporal)   Ht 5\' 3"  (1.6 m)   Wt 195 lb 2 oz (88.5 kg)   SpO2 99%   BMI 34.56 kg/m  Wt Readings from Last 3 Encounters:  10/08/22 195 lb 2 oz (88.5 kg)  07/10/22 176 lb 8 oz (80.1 kg)  04/16/22 178 lb 9.6 oz (81 kg)   BP Readings from Last 3 Encounters:  10/08/22 101/61  07/10/22 93/60  04/16/22 100/64    Physical Exam Vitals and nursing note reviewed.  Constitutional:      General: She is not in acute distress.    Appearance: She is not ill-appearing, toxic-appearing or diaphoretic.  HENT:     Head: Normocephalic and atraumatic.     Nose:     Comments: Intranasal lesion of left nare. Clear yellow drainage present.  Cardiovascular:     Rate and Rhythm: Normal rate and regular rhythm.     Heart sounds: Normal heart sounds. No murmur heard. Pulmonary:     Effort: Pulmonary effort is normal. No respiratory distress.     Breath sounds: Normal breath sounds.  Abdominal:     General: Bowel sounds are normal. There is no distension.     Palpations: Abdomen is soft.     Tenderness: There is no abdominal tenderness. There is no guarding or rebound.  Musculoskeletal:     Right shoulder: No tenderness or bony tenderness.     Left shoulder: No tenderness or bony tenderness.     Right upper arm: No tenderness or bony tenderness.     Left upper arm: No tenderness or bony tenderness.     Right forearm: No tenderness or bony tenderness.     Left forearm: No tenderness or bony tenderness.     Right hand: No tenderness or bony tenderness. Normal range of motion. Normal strength. Normal sensation. There is no disruption of two-point discrimination.     Left hand: No tenderness or bony tenderness. Normal range of motion.  Normal strength. Normal sensation. There is no disruption of two-point discrimination.     Cervical back: Neck supple. No rigidity. Muscular tenderness present. No pain with movement or spinous process tenderness.     Right lower leg: No edema.     Left lower leg: No edema.  Skin:    General: Skin is warm and dry.     Findings: Wound present.     Comments: Numerous small wounds to abdomen  and bilateral thighs, consistent with skin picking. Multiple areas with surround erythema and small amounts of yellow drainage.   Neurological:     General: No focal deficit present.     Mental Status: She is alert and oriented to person, place, and time.     Motor: No weakness.     Gait: Gait normal.  Psychiatric:        Mood and Affect: Mood normal.        Behavior: Behavior normal.        Thought Content: Thought content normal.        Judgment: Judgment normal.      No results found for any visits on 10/08/22.  Last CBC Lab Results  Component Value Date   WBC 5.8 01/09/2022   HGB 12.5 01/09/2022   HCT 37.3 01/09/2022   MCV 96 01/09/2022   MCH 32.3 01/09/2022   RDW 12.0 01/09/2022   PLT 148 (L) 01/09/2022   Last metabolic panel Lab Results  Component Value Date   GLUCOSE 71 07/10/2022   NA 144 07/10/2022   K 4.4 07/10/2022   CL 105 07/10/2022   CO2 25 07/10/2022   BUN 20 07/10/2022   CREATININE 1.28 (H) 07/10/2022   EGFR 48 (L) 07/10/2022   CALCIUM 10.1 07/10/2022   PROT 6.6 07/10/2022   ALBUMIN 4.3 07/10/2022   LABGLOB 2.3 07/10/2022   AGRATIO 1.9 07/10/2022   BILITOT 0.3 07/10/2022   ALKPHOS 94 07/10/2022   AST 24 07/10/2022   ALT 19 07/10/2022   Last lipids Lab Results  Component Value Date   CHOL 169 01/09/2022   HDL 51 01/09/2022   LDLCALC 107 (H) 01/09/2022   TRIG 56 01/09/2022   CHOLHDL 3.3 01/09/2022   Last thyroid functions Lab Results  Component Value Date   TSH 2.930 07/15/2021   T4TOTAL 5.7 07/15/2021      The 10-year ASCVD risk score (Arnett  DK, et al., 2019) is: 2.1%    Assessment & Plan:   Hannah [name] was seen today for medical management of chronic issues.  Diagnoses and all orders for this visit:  Class 1 obesity due to excess calories with serious comorbidity and body mass index (BMI) of 34.0 to 34.9 in adult Discussed diet and exercise. Will place referral to [facility] for uncontrolled anxiety. Discussed hobbies, walking outside when she has to urge to eat when not hungry.            -     BMP8+EGFR  Gastroesophageal reflux disease without esophagitis Well controlled on current regimen.   Cervical radiculopathy Restart celebrex.            -     celecoxib (CELEBREX) 100 MG capsule; Take 1 capsule (100 mg    total) by mouth daily.  Chronic neck pain Chronic bilateral low back pain without sciatica Controlled substance agreement signed Well controlled on current regimen. PDMP reviewed, no red flags. CSA is UTD. UDS today.  -     ToxASSURE Select 13 (MW), Urine -     Oxycodone HCl 10 MG TABS; Take 1 tablet (10 mg total) by mouth 3 (three) times daily as needed (severe pain). -     Oxycodone HCl 10 MG TABS; Take 1 tablet (10 mg total) by mouth 3 (three) times daily as needed (severe pain). -     Oxycodone HCl 10 MG TABS; Take 1 tablet (10 mg total) by mouth 3 (three) times daily as needed (severe pain).  Primary insomnia  Well controlled on current regimen. Continue trazodone.   Depression, recurrent (HCC) Anxiety Skin-picking disorder Uncontrolled. Denies SI. Will switch to prozac due to skin picking. Directions for cross taper give. Discussed referral to integrated BH. Collaboration of Care: Referral or follow-up with counselor/therapist [facility] Esmond Harps  Patient/Guardian was advised Release of Information must be obtained prior to any record release in order to collaborate their care with an outside provider. Patient/Guardian was advised if they have not already done so to contact the registration department to sign all  necessary forms in order for [place] to release information regarding their care.   Consent: Patient/Guardian gives verbal consent for treatment and assignment of benefits for services provided during this visit. Patient/Guardian expressed understanding and agreed to proceed.   -     FLUoxetine (PROZAC) 40 MG capsule; Take 1 capsule (40 mg total) by mouth daily. -     Ambulatory referral to Integrated Behavioral [facility]  Internal nasal lesion Bactroban as below.  -     mupirocin ointment (BACTROBAN) 2 %; Apply 1 Application topically 2 (two) times daily for 7 days.  Wound cellulitis Bactrim as below. Discussed home wound care and return precautions.  -     sulfamethoxazole-trimethoprim (BACTRIM DS) 800-160 MG tablet; Take 1 tablet by mouth 2 (two) times daily for 7 days.   Return in about 6 weeks (around 11/19/2022) for medication follow up.   The patient indicates understanding of these issues and agrees with the plan.  Gabriel Earing, FNP

## 2022-10-08 NOTE — Patient Instructions (Signed)
Start prozac 40 mg daily. Decrease zoloft to 100 mg daily for 3 days. Then decrease to zoloft 50 mg for 1 week, then stop zoloft.

## 2022-10-09 ENCOUNTER — Telehealth: Payer: Self-pay | Admitting: Family Medicine

## 2022-10-09 LAB — BMP8+EGFR
BUN/Creatinine Ratio: 14 (ref 12–28)
BUN: 16 mg/dL (ref 8–27)
Calcium: 10.1 mg/dL (ref 8.7–10.3)
Chloride: 107 mmol/L — ABNORMAL HIGH (ref 96–106)
Creatinine, Ser: 1.17 mg/dL — ABNORMAL HIGH (ref 0.57–1.00)
Glucose: 88 mg/dL (ref 70–99)
Potassium: 4.4 mmol/L (ref 3.5–5.2)
Sodium: 144 mmol/L (ref 134–144)
eGFR: 53 mL/min/{1.73_m2} — ABNORMAL LOW (ref >59)

## 2022-10-09 MED ORDER — MELOXICAM 7.5 MG PO TABS: 7.5000 mg | ORAL_TABLET | Freq: Every day | ORAL | 0 refills | Status: DC

## 2022-10-09 NOTE — Telephone Encounter (Signed)
Per Tiffany Morgan-send in meloxicam 7.5mg  take 1 PO daily till she can get celebrex. Pt is aware and rx sent in per verbal order.

## 2022-10-09 NOTE — Telephone Encounter (Signed)
Patient threw away the remainder of the  celecoxib (CELEBREX) 100 MG capsule that she had and said her insurance will not cover until 10/26/22 per her pharmacy. She said she thought she did not need to take it anymore and wants to know what PCP would like for her to do. Said she did not know if she should try taking a different medication or if she needed to wait until she was able to fill this medication on 10/26/22 - please call back and advise.

## 2022-10-12 ENCOUNTER — Encounter: Payer: Self-pay | Admitting: Family Medicine

## 2022-10-12 ENCOUNTER — Telehealth (INDEPENDENT_AMBULATORY_CARE_PROVIDER_SITE_OTHER): Payer: Medicare Other | Admitting: Family Medicine

## 2022-10-12 DIAGNOSIS — R509 Fever, unspecified: Secondary | ICD-10-CM | POA: Diagnosis not present

## 2022-10-12 DIAGNOSIS — A281 Cat-scratch disease: Secondary | ICD-10-CM

## 2022-10-12 DIAGNOSIS — M5412 Radiculopathy, cervical region: Secondary | ICD-10-CM | POA: Insufficient documentation

## 2022-10-12 DIAGNOSIS — F424 Excoriation (skin-picking) disorder: Secondary | ICD-10-CM | POA: Insufficient documentation

## 2022-10-12 MED ORDER — AZITHROMYCIN 250 MG PO TABS
ORAL_TABLET | ORAL | 0 refills | Status: AC
Start: 2022-10-12 — End: 2022-10-17

## 2022-10-12 NOTE — Progress Notes (Signed)
MyChart Video visit  Subjective: ZO:[id number] illness PCP: Gabriel Earing, FNP Hannah [name] is a 62 y.o. female. Patient provides verbal consent for consult held via video.  Due to COVID-19 pandemic this visit was conducted virtually. This visit type was conducted due to national recommendations for restrictions regarding the COVID-19 Pandemic (e.g. social distancing, sheltering in place) in an effort to limit this patient's exposure and mitigate transmission in our community. All issues noted in this document were discussed and addressed.  A physical exam was not performed with this format.   Location of patient: home Location of provider: [facility] Others present for call: none  1. Febrile illness Started feeling bad on Saturday.  Fever 103.46F.  She reports myalgia.  Last night she developed chills.  Today, she feels foggy brained.  She reports that she was just seen by her PCP and started on Septra last week for some lesions where her cat scratched her on her abdomen and she picked at them.  She feels that she may have had some type of sulfa intolerance in the past but is not sure.  She has no known sick contacts.  She simply feels worn out and this is why she requested appointment today.  She did not sleep well due to symptoms despite compliance with oral NSAID, pain reliever etc.  The lesions on her stomach do not appear to be any worse.  She denies any axillary lymphadenopathy  ROS: Per HPI  Allergies  Allergen Reactions   Demerol [Meperidine] Other (See Comments)    disorientation    Other Other (See Comments)    Disoriented and nausea   Latex Other (See Comments)    blisters   Morphine Palpitations    SLOW HEART RATE    Past Medical History:  Diagnosis Date   Acquired dilation of bile duct    Alcohol abuse    Anemia 06/27/2000   Anxiety    Arthritis 05/14/1998   Blood type O+    Chronic neck and back pain    Compulsive skin picking 1998   Depression    Drug abuse  (HCC)    GERD (gastroesophageal reflux disease)    History of iron deficiency anemia    Hyperlipidemia    Insomnia    Migraines    Osteoporosis 07/08/2017   T-score -2.6 in 2019; -2.2 on 08/05/2018   Vitamin D deficiency     Current Outpatient Medications:    meloxicam (MOBIC) 7.5 MG tablet, Take 1 tablet (7.5 mg total) by mouth daily., Disp: 30 tablet, Rfl: 0   B Complex-C (SUPER B COMPLEX PO), Take 1 tablet by mouth daily., Disp: , Rfl:    baclofen (LIORESAL) 10 MG tablet, Take 1 tablet by mouth three times daily as needed for muscle spasm, Disp: 90 tablet, Rfl: 1   busPIRone (BUSPAR) 15 MG tablet, TAKE 1 TABLET BY MOUTH IN THE MORNING AND  1  BY  MOUTH  AT  NOON  AND  1  BY  MOUTH  AT  BEDTIME, Disp: 320 tablet, Rfl: 3   calcium carbonate (OSCAL) 1500 (600 Ca) MG TABS tablet, Take by mouth 2 (two) times daily with a meal., Disp: , Rfl:    celecoxib (CELEBREX) 100 MG capsule, Take 1 capsule (100 mg total) by mouth daily., Disp: 90 capsule, Rfl: 0   Cholecalciferol (VITAMIN D3) 125 MCG (5000 UT) CAPS, Take 1 capsule by mouth daily., Disp: , Rfl:    CINNAMON PO, Take by mouth., Disp: ,  Rfl:    diphenhydrAMINE (BENADRYL) 25 mg capsule, Take 50 mg by mouth daily as needed., Disp: , Rfl:    famotidine (PEPCID) 20 MG tablet, Take 20 mg by mouth daily., Disp: , Rfl:    FLUoxetine (PROZAC) 40 MG capsule, Take 1 capsule (40 mg total) by mouth daily., Disp: 90 capsule, Rfl: 0   mupirocin ointment (BACTROBAN) 2 %, Apply 1 Application topically 2 (two) times daily for 7 days., Disp: 14 g, Rfl: 0   naloxone (NARCAN) nasal spray 4 mg/0.1 mL, Use nasally for overdose, Disp: 1 each, Rfl: 1   Oxycodone HCl 10 MG TABS, Take 1 tablet (10 mg total) by mouth 3 (three) times daily as needed (severe pain)., Disp: 90 tablet, Rfl: 0   [START ON 11/07/2022] Oxycodone HCl 10 MG TABS, Take 1 tablet (10 mg total) by mouth 3 (three) times daily as needed (severe pain)., Disp: 90 tablet, Rfl: 0   [START ON 12/07/2022]  Oxycodone HCl 10 MG TABS, Take 1 tablet (10 mg total) by mouth 3 (three) times daily as needed (severe pain)., Disp: 90 tablet, Rfl: 0   pantoprazole (PROTONIX) 20 MG tablet, Take 1 tablet by mouth once daily, Disp: 90 tablet, Rfl: 0   sulfamethoxazole-trimethoprim (BACTRIM DS) 800-160 MG tablet, Take 1 tablet by mouth 2 (two) times daily for 7 days., Disp: 14 tablet, Rfl: 0   traZODone (DESYREL) 100 MG tablet, Take 2 tablets (200 mg total) by mouth at bedtime., Disp: 180 tablet, Rfl: 3  Gen: nontoxic female, NAD HEENT: sclera white, MMM Pulm: normal WOB on room air Skin: several healing skin lesions noted along the abdomen, no purulence appreciated.  Assessment/ Plan: 62 y.o. female   Febrile illness, acute - Plan: azithromycin (ZITHROMAX) 250 MG tablet, Veritor Flu A/B Waived, Novel Coronavirus, NAA ([facility])  Cat-scratch disease - Plan: azithromycin (ZITHROMAX) 250 MG tablet  Uncertain etiology of febrile illness.  She will come in tomorrow if able to get rapid flu and send off COVID testing.  I am going to switch her from Septra to azithromycin in the event she is for what ever reason having some intolerance or resistance to the Septra.  Azithromycin should cover for cat scratch disease at least 80-89% of the time.  We discussed red flag signs and symptoms warranting further evaluation.  Continue to push oral fluids.  Tylenol as needed fever.  Avoid use of multiple NSAIDs at once.  Start time: 4:35pm End time: 4:45pm  Total time spent on patient care (including video visit/ documentation): 10 minutes  Ivan Maskell Hulen Skains, DO [facility] [phone]

## 2022-10-13 LAB — TOXASSURE SELECT 13 (MW), URINE

## 2022-10-23 ENCOUNTER — Other Ambulatory Visit: Payer: Self-pay | Admitting: Family Medicine

## 2022-10-23 DIAGNOSIS — F5101 Primary insomnia: Secondary | ICD-10-CM

## 2022-10-27 ENCOUNTER — Other Ambulatory Visit: Payer: Self-pay | Admitting: Family Medicine

## 2022-10-27 DIAGNOSIS — F419 Anxiety disorder, unspecified: Secondary | ICD-10-CM

## 2022-11-10 ENCOUNTER — Ambulatory Visit (INDEPENDENT_AMBULATORY_CARE_PROVIDER_SITE_OTHER): Payer: Medicare Other

## 2022-11-10 VITALS — Ht 63.0 in | Wt 190.0 lb

## 2022-11-10 DIAGNOSIS — Z0000 Encounter for general adult medical examination without abnormal findings: Secondary | ICD-10-CM

## 2022-11-10 NOTE — Progress Notes (Signed)
Subjective:   Hannah [name]. Fougerousse is a 62 y.o. female who presents for Medicare Annual (Subsequent) preventive examination. I connected with  Hannah [name]. Zapien on 11/10/22 by a audio enabled telemedicine application and verified that I am speaking with the correct person using two identifiers.  Patient Location: Home  Provider Location: Home Office  I discussed the limitations of evaluation and management by telemedicine. The patient expressed understanding and agreed to proceed.  Review of Systems     Cardiac Risk Factors include: advanced age (>2men, >69 women);dyslipidemia;hypertension     Objective:    Today's Vitals   11/10/22 1104  Weight: 190 lb (86.2 kg)  Height: 5\' 3"  (1.6 m)   Body mass index is 33.66 kg/m.     11/10/2022   11:08 AM 11/06/2021    3:41 PM 11/05/2020    4:23 PM  Advanced Directives  Does Patient Have a Medical Advance Directive? No No No  Would patient like information on creating a medical advance directive? No - Patient declined No - Patient declined No - Guardian declined    Current Medications (verified) Outpatient Encounter Medications as of 11/10/2022  Medication Sig   B Complex-C (SUPER B COMPLEX PO) Take 1 tablet by mouth daily.   baclofen (LIORESAL) 10 MG tablet Take 1 tablet by mouth three times daily as needed for muscle spasm   busPIRone (BUSPAR) 15 MG tablet TAKE 1 TABLET BY MOUTH IN THE MORNING, AT NOON, AND AT BEDTIME   calcium carbonate (OSCAL) 1500 (600 Ca) MG TABS tablet Take by mouth 2 (two) times daily with a meal.   celecoxib (CELEBREX) 100 MG capsule Take 1 capsule (100 mg total) by mouth daily.   Cholecalciferol (VITAMIN D3) 125 MCG (5000 UT) CAPS Take 1 capsule by mouth daily.   CINNAMON PO Take by mouth.   diphenhydrAMINE (BENADRYL) 25 mg capsule Take 50 mg by mouth daily as needed.   famotidine (PEPCID) 20 MG tablet Take 20 mg by mouth daily.   FLUoxetine (PROZAC) 40 MG capsule Take 1 capsule (40 mg total) by mouth daily.    meloxicam (MOBIC) 7.5 MG tablet Take 1 tablet (7.5 mg total) by mouth daily.   naloxone (NARCAN) nasal spray 4 mg/0.1 mL Use nasally for overdose   Oxycodone HCl 10 MG TABS Take 1 tablet (10 mg total) by mouth 3 (three) times daily as needed (severe pain).   [START ON 12/07/2022] Oxycodone HCl 10 MG TABS Take 1 tablet (10 mg total) by mouth 3 (three) times daily as needed (severe pain).   pantoprazole (PROTONIX) 20 MG tablet Take 1 tablet by mouth once daily   traZODone (DESYREL) 100 MG tablet TAKE 2 TABLETS BY MOUTH AT BEDTIME   No facility-administered encounter medications on file as of 11/10/2022.    Allergies (verified) Demerol [meperidine], Other, Latex, and Morphine   History: Past Medical History:  Diagnosis Date   Acquired dilation of bile duct    Alcohol abuse    Anemia 06/27/2000   Anxiety    Arthritis 05/14/1998   Blood type O+    Chronic neck and back pain    Compulsive skin picking 1998   Depression    Drug abuse (HCC)    GERD (gastroesophageal reflux disease)    History of iron deficiency anemia    Hyperlipidemia    Insomnia    Migraines    Osteoporosis 07/08/2017   T-score -2.6 in 2019; -2.2 on 08/05/2018   Vitamin D deficiency    Past  Surgical History:  Procedure Laterality Date   ABDOMINAL HYSTERECTOMY  09/07/2006   CESAREAN SECTION  02/16/1990   CHOLECYSTECTOMY  2001   ENDOMETRIAL ABLATION W/ NOVASURE     GASTRIC BYPASS  1998   HEMORRHOID SURGERY  2003   HERNIA REPAIR  2004   JOINT REPLACEMENT  08/21/2016   KNEE ARTHROSCOPY Right 2008   MOUTH SURGERY     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   Family History  Problem Relation Age of Onset   Anxiety disorder Mother    Depression Mother    Breast cancer Mother    Ovarian cancer Mother    Obesity Mother    Alcohol abuse Father    Heart disease Father    Kidney disease Brother    Heart disease Brother    Breast cancer Maternal Aunt    Breast cancer Maternal Grandmother    Breast cancer Maternal  Grandfather    Social History   Socioeconomic History   Marital status: Single    Spouse name: Not on file   Number of children: 1   Years of education: Not on file   Highest education level: Associate degree: occupational, Scientist, product/process development, or vocational program  Occupational History   Occupation: disabled  Tobacco Use   Smoking status: Former    Packs/day: 1    Types: Cigarettes    Quit date: 06/08/1989    Years since quitting: 33.4   Smokeless tobacco: Never  Vaping Use   Vaping Use: Never used  Substance and Sexual Activity   Alcohol use: Not Currently   Drug use: Not Currently   Sexual activity: Not Currently    Birth control/protection: Abstinence  Other Topics Concern   Not on file  Social History Narrative   Lives alone, one daughter that lives in [place]. Loves to read and go on walks.    Social Determinants of Health   Financial Resource Strain: Medium Risk (11/10/2022)   Overall Financial Resource Strain (CARDIA)    Difficulty of Paying Living Expenses: Somewhat hard  Food Insecurity: Food Insecurity Present (11/10/2022)   Hunger Vital Sign    Worried About Running Out of Food in the Last Year: Sometimes true    Ran Out of Food in the Last Year: Sometimes true  Transportation Needs: No Transportation Needs (11/10/2022)   PRAPARE - Administrator, Civil Service (Medical): No    Lack of Transportation (Non-Medical): No  Physical Activity: Insufficiently Active (11/10/2022)   Exercise Vital Sign    Days of Exercise per Week: 2 days    Minutes of Exercise per Session: 30 min  Stress: No Stress Concern Present (11/10/2022)   [employer] of Occupational Health - Occupational Stress Questionnaire    Feeling of Stress : Not at all  Social Connections: Socially Isolated (11/10/2022)   Social Connection and Isolation Panel [NHANES]    Frequency of Communication with Friends and Family: More than three times a week    Frequency of Social Gatherings with Friends and  Family: More than three times a week    Attends Religious Services: Never    Database administrator or Organizations: No    Attends Engineer, structural: Never    Marital Status: Divorced    Tobacco Counseling Counseling given: Not Answered   Clinical Intake:  Pre-visit preparation completed: Yes  Pain : No/denies pain     Nutritional Risks: None Diabetes: No  How often do you need to have someone help you when you  read instructions, pamphlets, or other written materials from your doctor or pharmacy?: 1 - Never  Diabetic?no   Interpreter Needed?: No  Information entered by :: Renie Ora, LPN   Activities of Daily Living    11/10/2022   11:08 AM 11/06/2022    8:49 AM  In your present state of health, do you have any difficulty performing the following activities:  Hearing? 0 0  Vision? 0 0  Difficulty concentrating or making decisions? 0 0  Walking or climbing stairs? 0 0  Dressing or bathing? 0 0  Doing errands, shopping? 0 0  Preparing Food and eating ? N N  Using the Toilet? N N  In the past six months, have you accidently leaked urine? N N  Do you have problems with loss of bowel control? N N  Managing your Medications? N N  Managing your Finances? N N  Housekeeping or managing your Housekeeping? N N    Patient Care Team: Gabriel Earing, FNP as PCP - General (Family Medicine) Michaelle Copas, MD as Referring Physician (Optometry)  Indicate any recent Medical Services you may have received from other than [facility] providers in the past year (date may be approximate).     Assessment:   This is a routine wellness examination for [facility].  Hearing/Vision screen Vision Screening - Comments:: Wears rx glasses - up to date with routine eye exams with  Dr.Lee   Dietary issues and exercise activities discussed: Current Exercise Habits: Home exercise routine, Type of exercise: walking, Time (Minutes): 30, Frequency (Times/Week): 2, Weekly Exercise  (Minutes/Week): 60, Intensity: Mild, Exercise limited by: None identified   Goals Addressed             This Visit's Progress    DIET - INCREASE WATER INTAKE         Depression Screen    11/10/2022   11:06 AM 10/08/2022    8:01 AM 07/10/2022    3:08 PM 04/16/2022   10:32 AM 01/09/2022   10:02 AM 11/06/2021    3:44 PM 10/09/2021   10:40 AM  PHQ 2/9 Scores  PHQ - 2 Score 0 0 0 0 0 1 1  PHQ- 9 Score 0 3 0 2 1 2 2     Fall Risk    11/10/2022   11:04 AM 11/06/2022    8:49 AM 10/08/2022    8:01 AM 04/16/2022   10:32 AM 01/09/2022   10:02 AM  Fall Risk   Falls in the past year? 0 0 0 0 1  Number falls in past yr: 0 0   0  Injury with Fall? 0 0   1  Risk for fall due to : No Fall Risks    History of fall(s)  Follow up Falls prevention discussed    Falls evaluation completed    FALL RISK PREVENTION PERTAINING TO THE HOME:  Any stairs in or around the home? No  If so, are there any without handrails? No  Home free of loose throw rugs in walkways, pet beds, electrical cords, etc? Yes  Adequate lighting in your home to reduce risk of falls? Yes   ASSISTIVE DEVICES UTILIZED TO PREVENT FALLS:  Life alert? No  Use of a cane, walker or w/c? No  Grab bars in the bathroom? No  Shower chair or bench in shower? No  Elevated toilet seat or a handicapped toilet? No        11/10/2022   11:08 AM 11/06/2021    3:22  PM 11/05/2020    4:26 PM  6CIT Screen  What Year? 0 points 0 points 0 points  What month? 0 points 0 points 0 points  What time? 0 points 0 points 0 points  Count back from 20 0 points 0 points 0 points  Months in reverse 0 points 0 points 0 points  Repeat phrase 0 points 0 points 0 points  Total Score 0 points 0 points 0 points    Immunizations Immunization History  Administered Date(s) Administered   Covid-19, Mrna,Vaccine(Spikevax)22yrs and older 05/13/2022   Influenza, Quadrivalent, Recombinant, Inj, Pf 02/19/2022   Influenza,inj,Quad PF,6+ Mos 03/26/2020, 03/17/2021    Influenza-Unspecified 02/20/2022   PFIZER(Purple Top)SARS-COV-2 Vaccination 09/15/2019, 10/06/2019, 05/05/2020   Pneumococcal Polysaccharide-23 08/04/2018   RSV,unspecified 01/19/2022   Respiratory Syncytial Virus Vaccine,Recomb Aduvanted(Arexvy) 01/18/2022   Zoster Recombinat (Shingrix) 06/08/2017    TDAP status: Due, Education has been provided regarding the importance of this vaccine. Advised may receive this vaccine at local pharmacy or Health Dept. Aware to provide a copy of the vaccination record if obtained from local pharmacy or Health Dept. Verbalized acceptance and understanding.  Flu Vaccine status: Declined, Education has been provided regarding the importance of this vaccine but patient still declined. Advised may receive this vaccine at local pharmacy or Health Dept. Aware to provide a copy of the vaccination record if obtained from local pharmacy or Health Dept. Verbalized acceptance and understanding.  Pneumococcal vaccine status: Up to date  Covid-19 vaccine status: Completed vaccines  Qualifies for Shingles Vaccine? Yes   Zostavax completed Yes   Shingrix Completed?: Yes  Screening Tests Health Maintenance  Topic Date Due   DTaP/Tdap/Td (1 - Tdap) Never done   MAMMOGRAM  11/05/2022   COVID-19 Vaccine (5 - 2023-24 season) 07/27/2023 (Originally 07/08/2022)   Zoster Vaccines- Shingrix (2 of 2) 01/08/2024 (Originally 08/03/2017)   INFLUENZA VACCINE  01/07/2023   Colonoscopy  06/09/2023   Medicare Annual Wellness (AWV)  11/10/2023   DEXA SCAN  01/10/2024   Hepatitis C Screening  Completed   HIV Screening  Completed   HPV VACCINES  Aged Out    Health Maintenance  Health Maintenance Due  Topic Date Due   DTaP/Tdap/Td (1 - Tdap) Never done   MAMMOGRAM  11/05/2022    Colorectal cancer screening: Type of screening: Colonoscopy. Completed 11/10/2022. Repeat every 5 years  Mammogram status: Ordered schedued 11/16/2022. Pt provided with contact info and advised to  call to schedule appt.   Bone Density status: Completed 01/09/2022. Results reflect: Bone density results: OSTEOPOROSIS. Repeat every 2 years.  Lung Cancer Screening: (Low Dose CT Chest recommended if Age 69-80 years, 30 pack-year currently smoking OR have quit w/in 15years.) does not qualify.   Lung Cancer Screening Referral: n/a  Additional Screening:  Hepatitis C Screening: does not qualify;   Vision Screening: Recommended annual ophthalmology exams for early detection of glaucoma and other disorders of the eye. Is the patient up to date with their annual eye exam?  Yes  Who is the provider or what is the name of the office in which the patient attends annual eye exams? Dr.Lee  If pt is not established with a provider, would they like to be referred to a provider to establish care? No .   Dental Screening: Recommended annual dental exams for proper oral hygiene  Community Resource Referral / Chronic Care Management: CRR required this visit?  No   CCM required this visit?  No      Plan:  I have personally reviewed and noted the following in the patient's chart:   Medical and social history Use of alcohol, tobacco or illicit drugs  Current medications and supplements including opioid prescriptions. Patient is not currently taking opioid prescriptions. Functional ability and status Nutritional status Physical activity Advanced directives List of other physicians Hospitalizations, surgeries, and ER visits in previous 12 months Vitals Screenings to include cognitive, depression, and falls Referrals and appointments  In addition, I have reviewed and discussed with patient certain preventive protocols, quality metrics, and best practice recommendations. A written personalized care plan for preventive services as well as general preventive health recommendations were provided to patient.     Lorrene Reid, LPN   [id number]   Nurse Notes: none

## 2022-11-10 NOTE — Patient Instructions (Signed)
Hannah [name] , Thank you for taking time to come for your Medicare Wellness Visit. I appreciate your ongoing commitment to your health goals. Please review the following plan we discussed and let me know if I can assist you in the future.   These are the goals we discussed:  Goals      DIET - INCREASE WATER INTAKE     Patient Stated     Wants to lose weight (goal is 145#), get more active and healthy - meet people in community and be more socially active [facility] sent today for this)        This is a list of the screening recommended for you and due dates:  Health Maintenance  Topic Date Due   DTaP/Tdap/Td vaccine (1 - Tdap) Never done   Mammogram  11/05/2022   COVID-19 Vaccine (5 - 2023-24 season) 07/27/2023*   Zoster (Shingles) Vaccine (2 of 2) 01/08/2024*   Flu Shot  01/07/2023   Colon Cancer Screening  06/09/2023   Medicare Annual Wellness Visit  11/10/2023   DEXA scan (bone density measurement)  01/10/2024   Hepatitis C Screening  Completed   HIV Screening  Completed   HPV Vaccine  Aged Out  *Topic was postponed. The date shown is not the original due date.    Advanced directives: ASSESSMENT:Advance directive discussed with you today. I have provided a copy for you to complete at home and have notarized. Once this is complete please bring a copy in to our office so we can scan it into your chart.   Conditions/risks identified: Aim for 30 minutes of exercise or brisk walking, 6-8 glasses of water, and 5 servings of fruits and vegetables each day.   Next appointment: Follow up in one year for your annual wellness visit.   Preventive Care 40-64 Years, Female Preventive care refers to lifestyle choices and visits with your health care provider that can promote health and wellness. What does preventive care include? A yearly physical exam. This is also called an annual well check. Dental exams once or twice a year. Routine eye exams. Ask your health care provider how often you  should have your eyes checked. Personal lifestyle choices, including: Daily care of your teeth and gums. Regular physical activity. Eating a healthy diet. Avoiding tobacco and drug use. Limiting alcohol use. Practicing safe sex. Taking low-dose aspirin daily starting at age 75. Taking vitamin and mineral supplements as recommended by your health care provider. What happens during an annual well check? The services and screenings done by your health care provider during your annual well check will depend on your age, overall health, lifestyle risk factors, and family history of disease. Counseling  Your health care provider may ask you questions about your: Alcohol use. Tobacco use. Drug use. Emotional well-being. Home and relationship well-being. Sexual activity. Eating habits. Work and work Astronomer. Method of birth control. Menstrual cycle. Pregnancy history. Screening  You may have the following tests or measurements: Height, weight, and BMI. Blood pressure. Lipid and cholesterol levels. These may be checked every 5 years, or more frequently if you are over 58 years old. Skin check. Lung cancer screening. You may have this screening every year starting at age 32 if you have a 30-pack-year history of smoking and currently smoke or have quit within the past 15 years. Fecal occult blood test (FOBT) of the stool. You may have this test every year starting at age 52. Flexible sigmoidoscopy or colonoscopy. You may have a sigmoidoscopy  every 5 years or a colonoscopy every 10 years starting at age 66. Hepatitis C blood test. Hepatitis B blood test. Sexually transmitted disease (STD) testing. Diabetes screening. This is done by checking your blood sugar (glucose) after you have not eaten for a while (fasting). You may have this done every 1-3 years. Mammogram. This may be done every 1-2 years. Talk to your health care provider about when you should start having regular mammograms.  This may depend on whether you have a family history of breast cancer. BRCA-related cancer screening. This may be done if you have a family history of breast, ovarian, tubal, or peritoneal cancers. Pelvic exam and Pap test. This may be done every 3 years starting at age 51. Starting at age 38, this may be done every 5 years if you have a Pap test in combination with an HPV test. Bone density scan. This is done to screen for osteoporosis. You may have this scan if you are at high risk for osteoporosis. Discuss your test results, treatment options, and if necessary, the need for more tests with your health care provider. Vaccines  Your health care provider may recommend certain vaccines, such as: Influenza vaccine. This is recommended every year. Tetanus, diphtheria, and acellular pertussis (Tdap, Td) vaccine. You may need a Td booster every 10 years. Zoster vaccine. You may need this after age 26. Pneumococcal 13-valent conjugate (PCV13) vaccine. You may need this if you have certain conditions and were not previously vaccinated. Pneumococcal polysaccharide (PPSV23) vaccine. You may need one or two doses if you smoke cigarettes or if you have certain conditions. Talk to your health care provider about which screenings and vaccines you need and how often you need them. This information is not intended to replace advice given to you by your health care provider. Make sure you discuss any questions you have with your health care provider. Document Released: 06/21/2015 Document Revised: 02/12/2016 Document Reviewed: 03/26/2015 Elsevier Interactive Patient Education  2017 [employer].    Fall Prevention in the Home Falls can cause injuries. They can happen to people of all ages. There are many things you can do to make your home safe and to help prevent falls. What can I do on the outside of my home? Regularly fix the edges of walkways and driveways and fix any cracks. Remove anything that might  make you trip as you walk through a door, such as a raised step or threshold. Trim any bushes or trees on the path to your home. Use bright outdoor lighting. Clear any walking paths of anything that might make someone trip, such as rocks or tools. Regularly check to see if handrails are loose or broken. Make sure that both sides of any steps have handrails. Any raised decks and porches should have guardrails on the edges. Have any leaves, snow, or ice cleared regularly. Use sand or salt on walking paths during winter. Clean up any spills in your garage right away. This includes oil or grease spills. What can I do in the bathroom? Use night lights. Install grab bars by the toilet and in the tub and shower. Do not use towel bars as grab bars. Use non-skid mats or decals in the tub or shower. If you need to sit down in the shower, use a plastic, non-slip stool. Keep the floor dry. Clean up any water that spills on the floor as soon as it happens. Remove soap buildup in the tub or shower regularly. Attach bath mats securely  with double-sided non-slip rug tape. Do not have throw rugs and other things on the floor that can make you trip. What can I do in the bedroom? Use night lights. Make sure that you have a light by your bed that is easy to reach. Do not use any sheets or blankets that are too big for your bed. They should not hang down onto the floor. Have a firm chair that has side arms. You can use this for support while you get dressed. Do not have throw rugs and other things on the floor that can make you trip. What can I do in the kitchen? Clean up any spills right away. Avoid walking on wet floors. Keep items that you use a lot in easy-to-reach places. If you need to reach something above you, use a strong step stool that has a grab bar. Keep electrical cords out of the way. Do not use floor polish or wax that makes floors slippery. If you must use wax, use non-skid floor wax. Do  not have throw rugs and other things on the floor that can make you trip. What can I do with my stairs? Do not leave any items on the stairs. Make sure that there are handrails on both sides of the stairs and use them. Fix handrails that are broken or loose. Make sure that handrails are as long as the stairways. Check any carpeting to make sure that it is firmly attached to the stairs. Fix any carpet that is loose or worn. Avoid having throw rugs at the top or bottom of the stairs. If you do have throw rugs, attach them to the floor with carpet tape. Make sure that you have a light switch at the top of the stairs and the bottom of the stairs. If you do not have them, ask someone to add them for you. What else can I do to help prevent falls? Wear shoes that: Do not have high heels. Have rubber bottoms. Are comfortable and fit you well. Are closed at the toe. Do not wear sandals. If you use a stepladder: Make sure that it is fully opened. Do not climb a closed stepladder. Make sure that both sides of the stepladder are locked into place. Ask someone to hold it for you, if possible. Clearly mark and make sure that you can see: Any grab bars or handrails. First and last steps. Where the edge of each step is. Use tools that help you move around (mobility aids) if they are needed. These include: Canes. Walkers. Scooters. Crutches. Turn on the lights when you go into a dark area. Replace any light bulbs as soon as they burn out. Set up your furniture so you have a clear path. Avoid moving your furniture around. If any of your floors are uneven, fix them. If there are any pets around you, be aware of where they are. Review your medicines with your doctor. Some medicines can make you feel dizzy. This can increase your chance of falling. Ask your doctor what other things that you can do to help prevent falls. This information is not intended to replace advice given to you by your health care  provider. Make sure you discuss any questions you have with your health care provider. Document Released: 03/21/2009 Document Revised: 10/31/2015 Document Reviewed: 06/29/2014 Elsevier Interactive Patient Education  2017 [employer].

## 2022-11-11 ENCOUNTER — Ambulatory Visit: Payer: Medicare Other

## 2022-11-15 ENCOUNTER — Other Ambulatory Visit: Payer: Self-pay | Admitting: Family Medicine

## 2022-11-15 DIAGNOSIS — F419 Anxiety disorder, unspecified: Secondary | ICD-10-CM

## 2022-11-16 ENCOUNTER — Ambulatory Visit
Admission: RE | Admit: 2022-11-16 | Discharge: 2022-11-16 | Disposition: A | Discharge status: Home / Self Care | Payer: Medicare Other | Source: Ambulatory Visit | Attending: Family Medicine | Admitting: Family Medicine

## 2022-11-16 DIAGNOSIS — Z1231 Encounter for screening mammogram for malignant neoplasm of breast: Secondary | ICD-10-CM | POA: Diagnosis not present

## 2022-11-19 ENCOUNTER — Ambulatory Visit: Payer: Medicare Other | Admitting: Family Medicine

## 2022-11-23 ENCOUNTER — Ambulatory Visit (INDEPENDENT_AMBULATORY_CARE_PROVIDER_SITE_OTHER): Payer: Medicare Other | Admitting: Professional Counselor

## 2022-11-23 DIAGNOSIS — F419 Anxiety disorder, unspecified: Secondary | ICD-10-CM

## 2022-11-23 DIAGNOSIS — F339 Major depressive disorder, recurrent, unspecified: Secondary | ICD-10-CM

## 2022-11-23 DIAGNOSIS — F424 Excoriation (skin-picking) disorder: Secondary | ICD-10-CM | POA: Diagnosis not present

## 2022-11-23 NOTE — Patient Instructions (Addendum)
[facility] reccomends increase physical activity ie walking once a day.  Look into OA for support with weight loss.  VIRTUAL ONLY Wednesday, 12pm, [facility], [address] Benay Pillow: Kriste Basque [phone]  Zoom Meeting ID: [id number] Practice Mindfulness worksheet.  If your symptoms worsen or you have thoughts of suicide/homicide, PLEASE SEEK IMMEDIATE MEDICAL ATTENTION.  You may always call:   National Suicide Hotline: 988 or 928 102 6419 [place] Crisis Line: (458) 694-8403 Crisis Recovery in [place]: 530-717-8611     These are available 24 hours a day, 7 days a week.
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
## 2022-11-24 ENCOUNTER — Other Ambulatory Visit: Payer: Self-pay | Admitting: Family Medicine

## 2022-11-24 DIAGNOSIS — G8929 Other chronic pain: Secondary | ICD-10-CM

## 2022-12-01 IMAGING — MG MM DIGITAL SCREENING BILAT W/ TOMO AND CAD
6 of 10 series · 6 of 30 positions shown · non-contrast
Comparison: Previous exam(s).

CLINICAL DATA: Screening.

EXAM:
DIGITAL SCREENING BILATERAL MAMMOGRAM WITH TOMOSYNTHESIS AND CAD
TECHNIQUE: Bilateral screening digital craniocaudal and mediolateral oblique
mammograms were obtained. Bilateral screening digital breast
tomosynthesis was performed. The images were evaluated with
computer-aided detection.

[R MLO synth-2D]
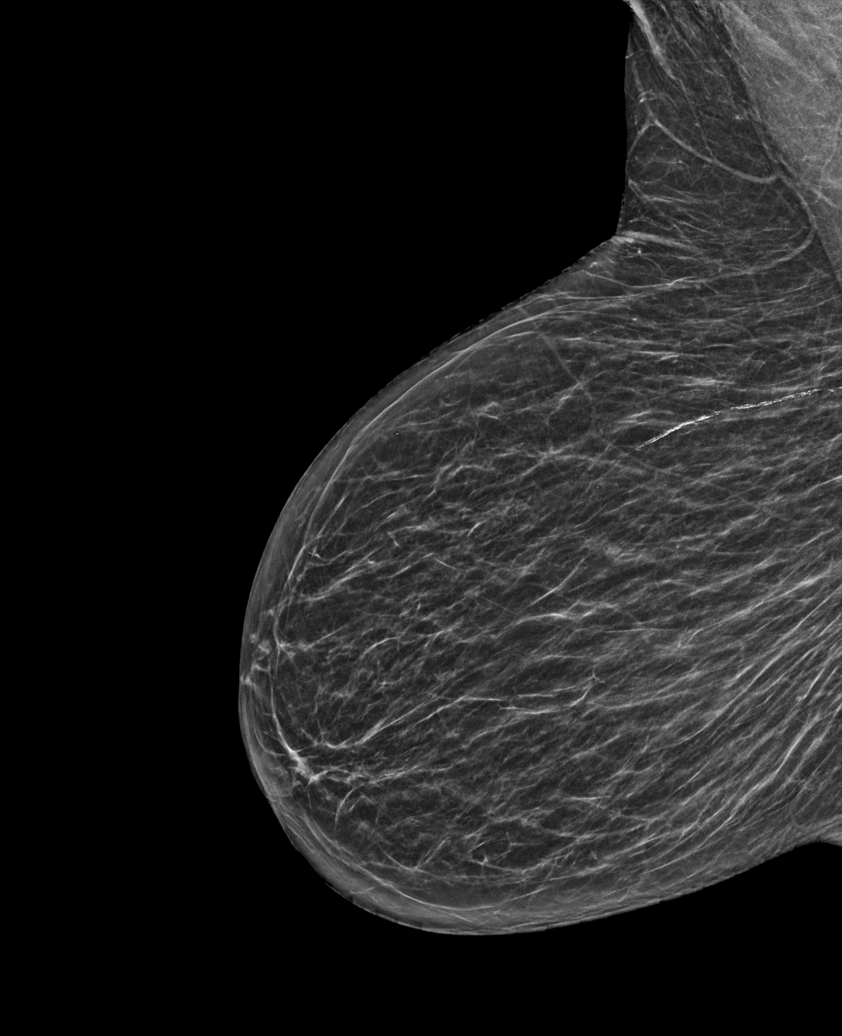

[L MLO synth-2D]
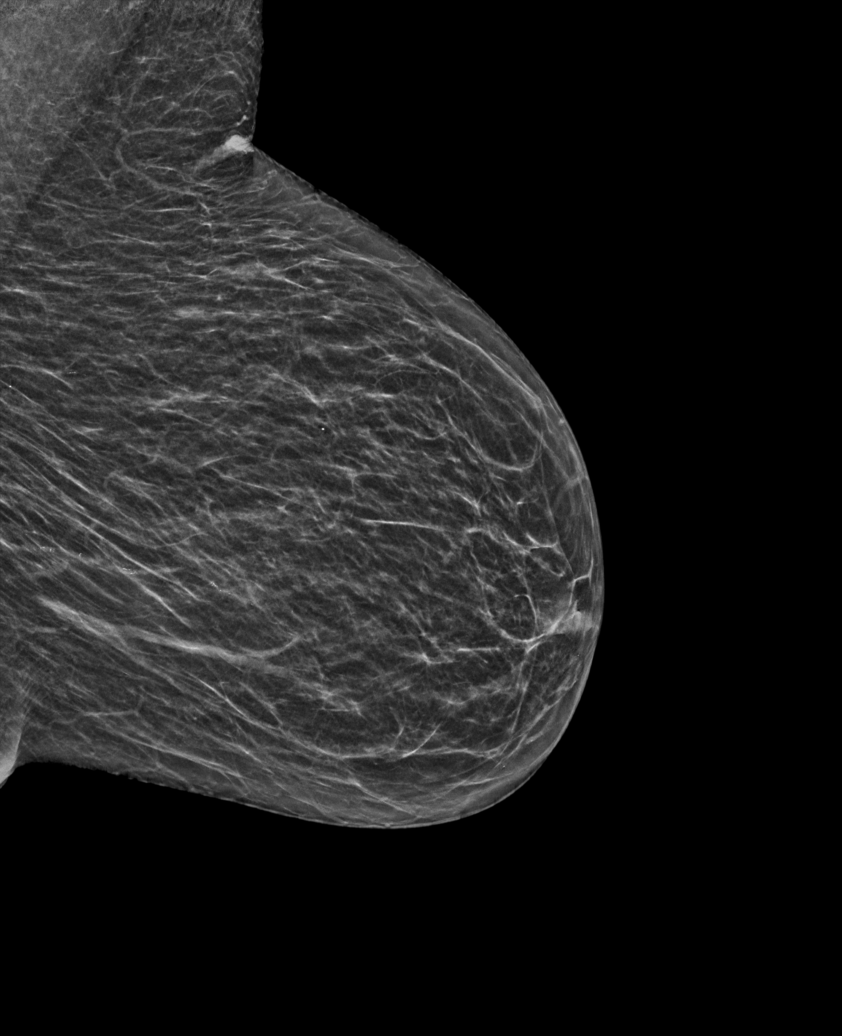

[L CV synth-2D]
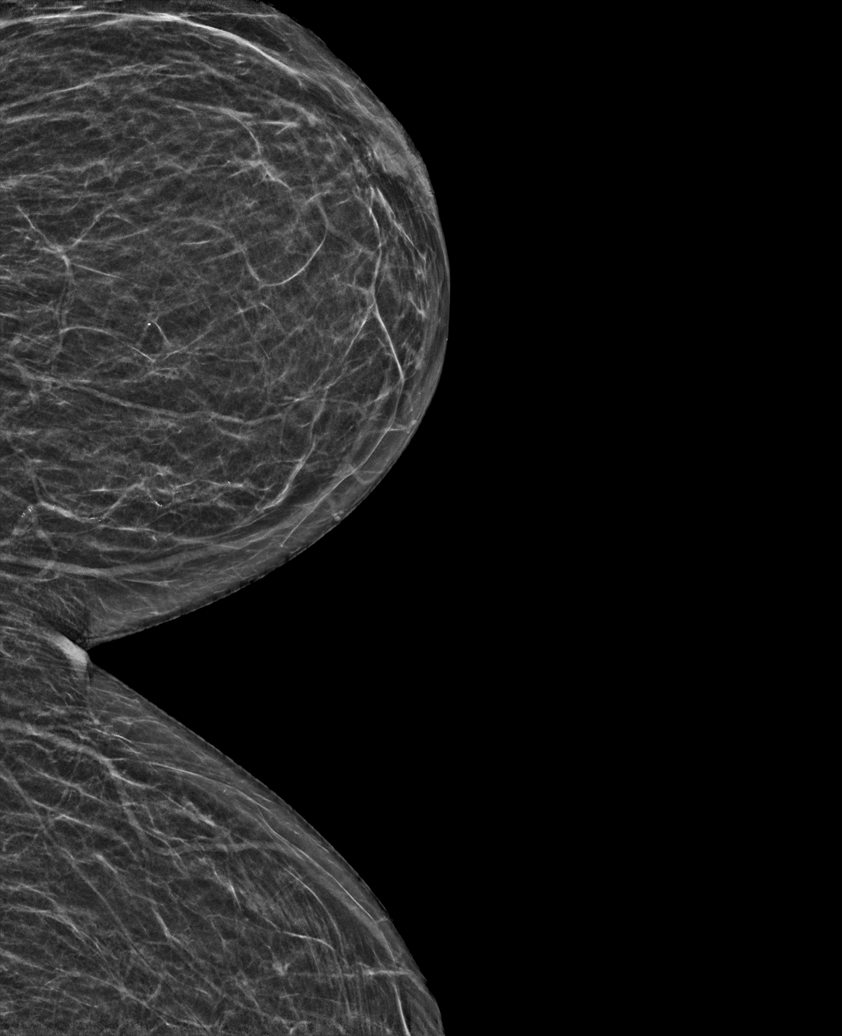

[L CC synth-2D]
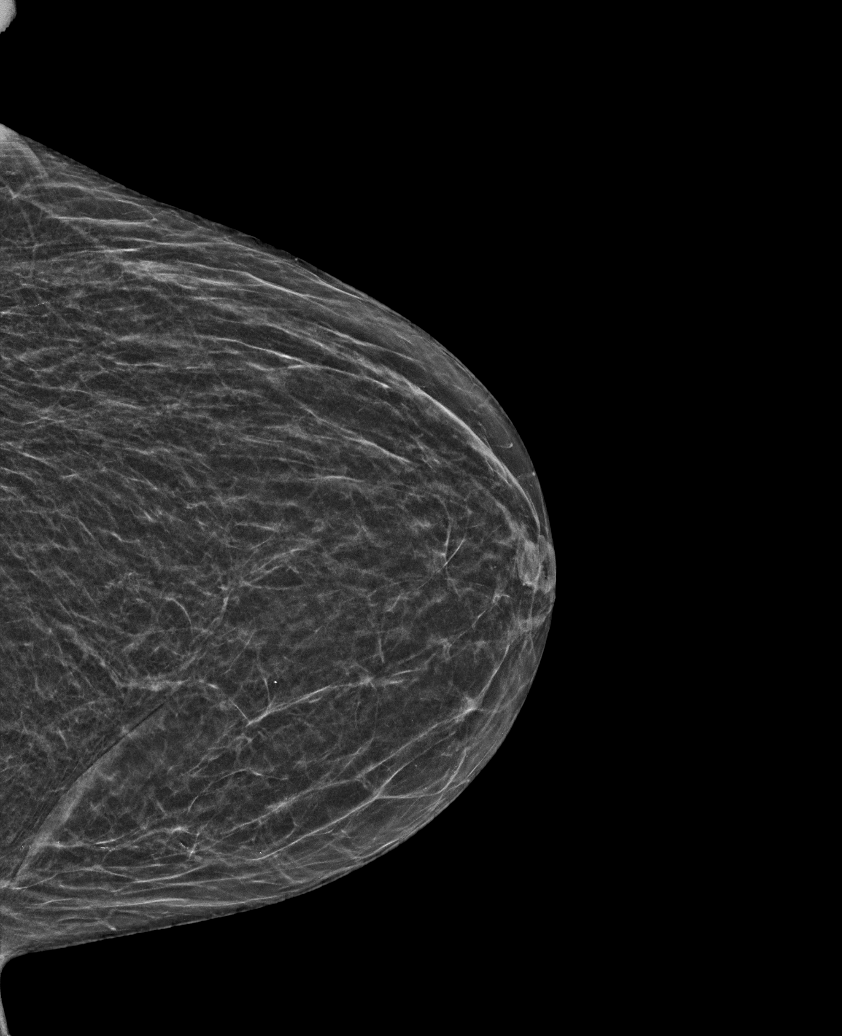

[R CC synth-2D]
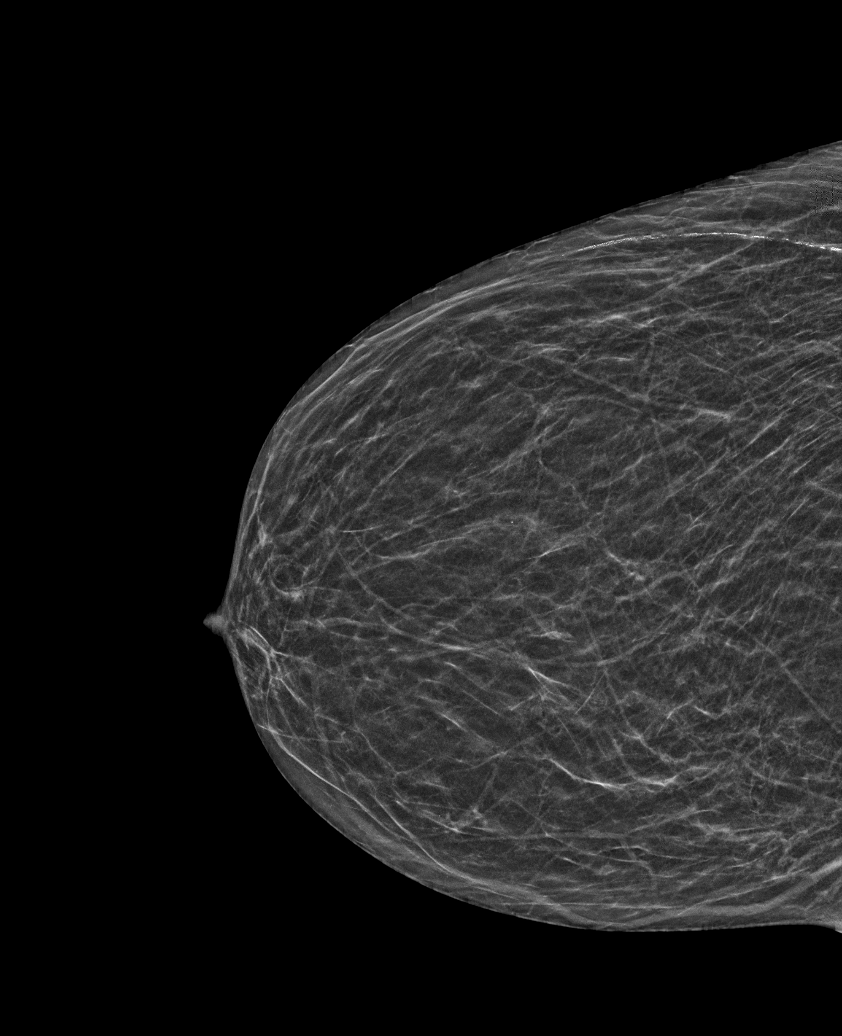

[L CC tomo · tomo slice 23/44.0]
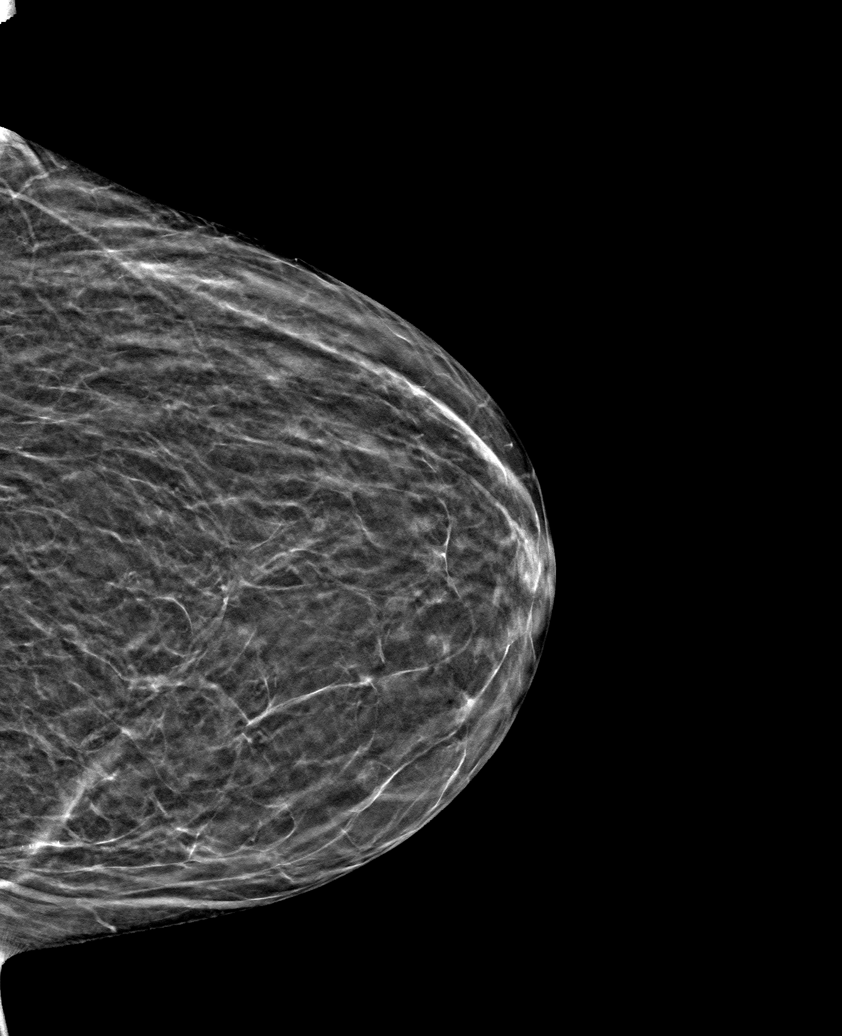

[6 of 30 positions shown; findings below may reference images not displayed]

ACR Breast Density Category b: There are scattered areas of
fibroglandular density.
FINDINGS: There are no findings suspicious for malignancy.
IMPRESSION: No mammographic evidence of malignancy. A result letter of this
screening mammogram will be mailed directly to the patient.

RECOMMENDATION:
Screening mammogram in one year. (Code:[id number]-[id number])

BI-RADS CATEGORY  1: Negative.

## 2022-12-02 ENCOUNTER — Telehealth (INDEPENDENT_AMBULATORY_CARE_PROVIDER_SITE_OTHER): Payer: Medicare Other | Admitting: Professional Counselor

## 2022-12-02 DIAGNOSIS — F419 Anxiety disorder, unspecified: Secondary | ICD-10-CM | POA: Diagnosis not present

## 2022-12-02 DIAGNOSIS — F424 Excoriation (skin-picking) disorder: Secondary | ICD-10-CM

## 2022-12-02 DIAGNOSIS — F339 Major depressive disorder, recurrent, unspecified: Secondary | ICD-10-CM

## 2022-12-02 NOTE — BH Specialist Note (Signed)
Behavioral Health Treatment Plan Team Note  MRN: [id number] NAME: Hannah [name]  DATE: 12/04/22  Start time: Start Time: 0250 End time: Stop Time: 0305 Total time: Total Time in Minutes (Visit): 15  Total number of Virtual BH Treatment Team Plan encounters: 1/4  Treatment Team Attendees: Dr. Vanetta Shawl and Esmond Harps  Diagnoses:    ICD-10-CM   1. Skin-picking disorder  F42.4       Goals, Interventions and Follow-up Plan Goals:  "I am hoping to get to my goal weight of 150lbs. I want to lose 40lbs" Interventions: Behavioral Activation CBT Cognitive Behavioral Therapy Medication Management Recommendations:  Follow-up Plan: Referral for psychiatric evaluation Recommendation Referral to psychiatry and therapy for CBT   History of the present illness Presenting Problem/Current Symptoms:  Patient is a 62 yo female with a history of MDD, Anxiety and Skin Picking Disorder. Patients' chief complaint is her weight gain and compulsive eating. Patient reports she was on a weight loss medication and lost over 100 pounds. She discontinued ozempic in January of this year due to insurance issues. She started to eat more since stopping the medication and has gained 20 lbs back. She reports that she has been eating compulsively and feels like she is eating to cope. Patient reports her brother has terminal cancer which is causing her distress. Patient reports she lacks family support and that her family is "dysfunctional". Patient reports she is on disability and has financial issues as well. She reports not having many friends since she moved here 3 years ago. Patient has a history rue n y bypass surgery in which she began to pick at her skin after surgery. Patient is seeking support to help meet her goal weight of 150lbs and to help manage her compulsive eating and skin picking.   Screenings PHQ-9 Assessments:     11/23/2022   10:02 AM 11/10/2022   11:06 AM 10/08/2022    8:01 AM  Depression screen  PHQ 2/9  Decreased Interest 0 0 0  Down, Depressed, Hopeless 0 0 0  PHQ - 2 Score 0 0 0  Altered sleeping 0 0 0  Tired, decreased energy 1 0 1  Change in appetite 1 0 2  Feeling bad or failure about yourself  0 0 0  Trouble concentrating 0 0 0  Moving slowly or fidgety/restless 0 0 0  Suicidal thoughts 0 0 0  PHQ-9 Score 2 0 3  Difficult doing work/chores  Not difficult at all Not difficult at all   GAD-7 Assessments:     11/23/2022   10:02 AM 10/08/2022    8:01 AM 07/10/2022    3:08 PM 04/16/2022   10:32 AM  GAD 7 : Generalized Anxiety Score  Nervous, Anxious, on Edge 1 0 0 0  Control/stop worrying 1 0 0 0  Worry too much - different things 1 1 0 0  Trouble relaxing 1 0 0 0  Restless 0 0 0 0  Easily annoyed or irritable 1 0 0 0  Afraid - awful might happen 1 0 0 0  Total GAD 7 Score 6 1 0 0  Anxiety Difficulty  Not difficult at all Not difficult at all Not difficult at all    Past Medical History Past Medical History:  Diagnosis Date   Acquired dilation of bile duct    Alcohol abuse    Anemia 06/27/2000   Anxiety    Arthritis 05/14/1998   Blood type O+    Chronic neck and back pain  Compulsive skin picking 1998   Depression    Drug abuse (HCC)    GERD (gastroesophageal reflux disease)    History of iron deficiency anemia    Hyperlipidemia    Insomnia    Migraines    Osteoporosis 07/08/2017   T-score -2.6 in 2019; -2.2 on 08/05/2018   Vitamin D deficiency     Vital signs: There were no vitals filed for this visit.  Allergies:  Allergies as of 12/02/2022 - Review Complete 11/10/2022  Allergen Reaction Noted   Demerol [meperidine] Other (See Comments) 06/09/2015   Other Other (See Comments) 06/09/2015   Latex Other (See Comments) 10/10/2020   Morphine Palpitations 06/09/2015    Medication History Current medications:  Outpatient Encounter Medications as of 12/02/2022  Medication Sig   B Complex-C (SUPER B COMPLEX PO) Take 1 tablet by mouth daily.    baclofen (LIORESAL) 10 MG tablet Take 1 tablet by mouth three times daily as needed for muscle spasm   busPIRone (BUSPAR) 15 MG tablet TAKE 1 TABLET BY MOUTH IN THE MORNING, AT NOON, AND AT BEDTIME   calcium carbonate (OSCAL) 1500 (600 Ca) MG TABS tablet Take by mouth 2 (two) times daily with a meal.   celecoxib (CELEBREX) 100 MG capsule Take 1 capsule (100 mg total) by mouth daily.   Cholecalciferol (VITAMIN D3) 125 MCG (5000 UT) CAPS Take 1 capsule by mouth daily.   CINNAMON PO Take by mouth.   diphenhydrAMINE (BENADRYL) 25 mg capsule Take 50 mg by mouth daily as needed.   famotidine (PEPCID) 20 MG tablet Take 20 mg by mouth daily.   FLUoxetine (PROZAC) 40 MG capsule Take 1 capsule (40 mg total) by mouth daily.   meloxicam (MOBIC) 7.5 MG tablet Take 1 tablet (7.5 mg total) by mouth daily.   naloxone (NARCAN) nasal spray 4 mg/0.1 mL Use nasally for overdose   Oxycodone HCl 10 MG TABS Take 1 tablet (10 mg total) by mouth 3 (three) times daily as needed (severe pain).   [START ON 12/07/2022] Oxycodone HCl 10 MG TABS Take 1 tablet (10 mg total) by mouth 3 (three) times daily as needed (severe pain).   pantoprazole (PROTONIX) 20 MG tablet Take 1 tablet by mouth once daily   traZODone (DESYREL) 100 MG tablet TAKE 2 TABLETS BY MOUTH AT BEDTIME   No facility-administered encounter medications on file as of 12/02/2022.     Scribe for Treatment Team: Reuel Boom

## 2022-12-06 ENCOUNTER — Other Ambulatory Visit: Payer: Self-pay | Admitting: Family Medicine

## 2022-12-06 DIAGNOSIS — F331 Major depressive disorder, recurrent, moderate: Secondary | ICD-10-CM

## 2022-12-07 ENCOUNTER — Ambulatory Visit: Payer: Medicare Other | Admitting: Professional Counselor

## 2022-12-09 ENCOUNTER — Telehealth: Payer: Self-pay | Admitting: Family Medicine

## 2022-12-09 ENCOUNTER — Encounter: Payer: Self-pay | Admitting: Family Medicine

## 2022-12-09 ENCOUNTER — Ambulatory Visit (INDEPENDENT_AMBULATORY_CARE_PROVIDER_SITE_OTHER): Payer: Medicare Other | Admitting: Family Medicine

## 2022-12-09 VITALS — BP 105/61 | HR 71 | Temp 97.9°F | Ht 63.0 in | Wt 203.6 lb

## 2022-12-09 DIAGNOSIS — M545 Low back pain, unspecified: Secondary | ICD-10-CM | POA: Diagnosis not present

## 2022-12-09 DIAGNOSIS — G8929 Other chronic pain: Secondary | ICD-10-CM

## 2022-12-09 DIAGNOSIS — F339 Major depressive disorder, recurrent, unspecified: Secondary | ICD-10-CM | POA: Diagnosis not present

## 2022-12-09 DIAGNOSIS — M542 Cervicalgia: Secondary | ICD-10-CM | POA: Diagnosis not present

## 2022-12-09 DIAGNOSIS — F419 Anxiety disorder, unspecified: Secondary | ICD-10-CM

## 2022-12-09 DIAGNOSIS — Z23 Encounter for immunization: Secondary | ICD-10-CM

## 2022-12-09 DIAGNOSIS — L0390 Cellulitis, unspecified: Secondary | ICD-10-CM | POA: Diagnosis not present

## 2022-12-09 DIAGNOSIS — F424 Excoriation (skin-picking) disorder: Secondary | ICD-10-CM

## 2022-12-09 DIAGNOSIS — Z5941 Food insecurity: Secondary | ICD-10-CM

## 2022-12-09 DIAGNOSIS — Z79899 Other long term (current) drug therapy: Secondary | ICD-10-CM | POA: Diagnosis not present

## 2022-12-09 MED ORDER — OXYCODONE HCL 10 MG PO TABS
10.0000 mg | ORAL_TABLET | Freq: Three times a day (TID) | ORAL | 0 refills | Status: AC | PRN
Start: 2023-01-07 — End: 2023-02-06

## 2022-12-09 MED ORDER — OXYCODONE HCL 10 MG PO TABS
10.0000 mg | ORAL_TABLET | Freq: Three times a day (TID) | ORAL | 0 refills | Status: AC | PRN
Start: 2023-02-07 — End: 2023-03-09

## 2022-12-09 MED ORDER — BUSPIRONE HCL 15 MG PO TABS
15.0000 mg | ORAL_TABLET | Freq: Three times a day (TID) | ORAL | 3 refills | Status: AC
Start: 2022-12-09 — End: ?

## 2022-12-09 MED ORDER — SERTRALINE HCL 100 MG PO TABS
200.0000 mg | ORAL_TABLET | Freq: Every day | ORAL | 3 refills | Status: AC
Start: 2022-12-09 — End: ?

## 2022-12-09 MED ORDER — MUPIROCIN 2 % EX OINT
1.0000 | TOPICAL_OINTMENT | Freq: Two times a day (BID) | CUTANEOUS | 0 refills | Status: AC
Start: 2022-12-09 — End: ?

## 2022-12-09 MED ORDER — BACLOFEN 10 MG PO TABS
10.0000 mg | ORAL_TABLET | Freq: Three times a day (TID) | ORAL | 0 refills | Status: DC | PRN
Start: 2022-12-09 — End: 2022-12-23

## 2022-12-09 MED ORDER — CEPHALEXIN 500 MG PO CAPS
500.0000 mg | ORAL_CAPSULE | Freq: Two times a day (BID) | ORAL | 0 refills | Status: DC
Start: 2022-12-09 — End: 2023-01-20

## 2022-12-09 NOTE — Progress Notes (Signed)
Established Patient Office Visit  Subjective   Patient ID: Hannah [name], female    DOB: [birth year]  Age: 62 y.o. MRN: [id number]  Chief Complaint  Patient presents with   Medical Management of Chronic Issues    PATIENT STATES SHE IS HAVING HEAD PAIN NOT HEADACHES THAT LAST 10-30MIN OFF AND ON FOR A WEEK   Depression    PATIENT STATES PROZAC MADE HER INSTANTLY ANGER WANTS BACK ON ZOLOFT     HPI Hannah [name] is here for a follow up of anxiety and depression. She stopped taking prozac as it made her very angry and restarted on zoloft 200 mg. She only tried prozac for 1 day. She feels like her anxiety and depression is improving. She had a visit with BH her at [facility]. Referral out to psyciatry was recommended. She has been continuing to pick at her skin, mostly her abdomen. She denies fever or drainage. Some areas has redness around this sores. She has been keeping them clean and using abx ointment.   She reprots that she is having pain in her head in various locations that is very sharp and last for 10-30 minutes and than resolved on its own. Denies other symptoms with it. For the last week. No focal deficits.   She is also having increased swelling in her feet with the head. She is trying to elevate her feet. She has not been wearing compression socks.   She has gained weight since her last visit. She is very upset by this.   She would like a referral to SW. She is struggling to afford food with the increase in prices. Denies difficulty paying bills.      12/09/2022    9:02 AM 11/23/2022   10:02 AM 11/10/2022   11:06 AM  Depression screen PHQ 2/9  Decreased Interest 0 0 0  Down, Depressed, Hopeless 0 0 0  PHQ - 2 Score 0 0 0  Altered sleeping 0 0 0  Tired, decreased energy 1 1 0  Change in appetite 1 1 0  Feeling bad or failure about yourself  0 0 0  Trouble concentrating 0 0 0  Moving slowly or fidgety/restless 0 0 0  Suicidal thoughts 0 0 0  PHQ-9 Score 2 2 0  Difficult doing  work/chores Not difficult at all  Not difficult at all      12/09/2022    9:02 AM 11/23/2022   10:02 AM 10/08/2022    8:01 AM 07/10/2022    3:08 PM  GAD 7 : Generalized Anxiety Score  Nervous, Anxious, on Edge 0 1 0 0  Control/stop worrying 0 1 0 0  Worry too much - different things 0 1 1 0  Trouble relaxing 0 1 0 0  Restless 0 0 0 0  Easily annoyed or irritable 1 1 0 0  Afraid - awful might happen 0 1 0 0  Total GAD 7 Score 1 6 1  0  Anxiety Difficulty Not difficult at all  Not difficult at all Not difficult at all       ROS As per HPI.    Objective:     BP 105/61   Pulse 71   Temp 97.9 F (36.6 C) (Temporal)   Ht 5\' 3"  (1.6 m)   Wt 203 lb 9.6 oz (92.4 kg)   SpO2 96%   BMI 36.07 kg/m  Wt Readings from Last 3 Encounters:  12/09/22 203 lb 9.6 oz (92.4 kg)  11/10/22 190 lb (86.2 kg)  10/08/22 195 lb 2 oz (88.5 kg)      Physical Exam Vitals and nursing note reviewed.  Constitutional:      General: She is not in acute distress.    Appearance: She is obese. She is not ill-appearing, toxic-appearing or diaphoretic.  Cardiovascular:     Rate and Rhythm: Normal rate and regular rhythm.     Heart sounds: Normal heart sounds. No murmur heard. Pulmonary:     Effort: Pulmonary effort is normal. No respiratory distress.     Breath sounds: Normal breath sounds. No stridor. No wheezing.  Abdominal:     General: There is no distension.     Palpations: Abdomen is soft.     Tenderness: There is no abdominal tenderness. There is no guarding or rebound.  Musculoskeletal:     Cervical back: Neck supple. No rigidity.     Right lower leg: No edema.     Left lower leg: No edema.  Skin:    General: Skin is warm and dry.     Findings: Wound present.     Comments: Numerous small wounds to abdomen. Multiple areas with surround erythema and small amounts of yellow drainage.    Neurological:     General: No focal deficit present.     Mental Status: She is alert and oriented to person,  place, and time.  Psychiatric:        Mood and Affect: Mood normal.        Behavior: Behavior normal.      No results found for any visits on 12/09/22.    The 10-year ASCVD risk score (Arnett DK, et al., 2019) is: 2.3%    Assessment & Plan:   Hannah [name] was seen today for medical management of chronic issues and depression.  Diagnoses and all orders for this visit:  Anxiety Improving. Continue buspar and zoloft. Referral to psychiatry discussed and ordered.  -     busPIRone (BUSPAR) 15 MG tablet; Take 1 tablet (15 mg total) by mouth 3 (three) times daily. -     Ambulatory referral to Psychiatry -     sertraline (ZOLOFT) 100 MG tablet; Take 2 tablets (200 mg total) by mouth daily.  Depression, recurrent (HCC) Well controlled on current regimen.  -     Ambulatory referral to Psychiatry -     sertraline (ZOLOFT) 100 MG tablet; Take 2 tablets (200 mg total) by mouth daily.  Skin-picking disorder Uncontrolled. Referral placed.  -     Ambulatory referral to Psychiatry  Wound cellulitis Keflex as below.  -     mupirocin ointment (BACTROBAN) 2 %; Apply 1 Application topically 2 (two) times daily. -     cephALEXin (KEFLEX) 500 MG capsule; Take 1 capsule (500 mg total) by mouth 2 (two) times daily.  Chronic neck pain -     Oxycodone HCl 10 MG TABS; Take 1 tablet (10 mg total) by mouth 3 (three) times daily as needed (severe pain). -     baclofen (LIORESAL) 10 MG tablet; Take 1 tablet (10 mg total) by mouth 3 (three) times daily as needed. for muscle spams -     Oxycodone HCl 10 MG TABS; Take 1 tablet (10 mg total) by mouth 3 (three) times daily as needed (severe pain).  Controlled substance agreement signed Chronic bilateral low back pain without sciatica PDMP reviewed. No red flags. CSA and UDS are UTD. Refill provided.  -     Oxycodone HCl 10 MG TABS; Take 1 tablet (10  mg total) by mouth 3 (three) times daily as needed (severe pain). -     Oxycodone HCl 10 MG TABS; Take 1 tablet  (10 mg total) by mouth 3 (three) times daily as needed (severe pain).  Morbid obesity (HCC) Diet and exercise.   Need for vaccination -     Tdap vaccine greater than or equal to 7yo IM  Food insecurity -     Ambulatory referral to Social Work   Return in about 6 weeks (around 01/20/2023) for medication follow up.   The patient indicates understanding of these issues and agrees with the plan.  Gabriel Earing, FNP

## 2022-12-09 NOTE — Telephone Encounter (Signed)
April called from [facility] stating that they received patients referral but says patient takes Oxycodone so they can't accept her as a patient because they do Behavioral Health and Medicaid Substance Abuse treatment.

## 2022-12-23 ENCOUNTER — Other Ambulatory Visit: Payer: Self-pay | Admitting: Family Medicine

## 2022-12-23 DIAGNOSIS — G8929 Other chronic pain: Secondary | ICD-10-CM

## 2022-12-25 ENCOUNTER — Other Ambulatory Visit: Payer: Self-pay | Admitting: Family Medicine

## 2022-12-25 DIAGNOSIS — K219 Gastro-esophageal reflux disease without esophagitis: Secondary | ICD-10-CM

## 2023-01-08 ENCOUNTER — Other Ambulatory Visit: Payer: Self-pay | Admitting: Family Medicine

## 2023-01-08 DIAGNOSIS — G8929 Other chronic pain: Secondary | ICD-10-CM

## 2023-01-19 NOTE — Addendum Note (Signed)
Addended by: Reuel Boom on: 01/19/2023 02:49 PM   Modules accepted: Orders

## 2023-01-20 ENCOUNTER — Encounter: Payer: Self-pay | Admitting: Family Medicine

## 2023-01-20 ENCOUNTER — Ambulatory Visit (INDEPENDENT_AMBULATORY_CARE_PROVIDER_SITE_OTHER): Payer: Medicare Other | Admitting: Family Medicine

## 2023-01-20 VITALS — BP 98/68 | HR 71 | Temp 98.3°F | Ht 63.0 in | Wt 196.5 lb

## 2023-01-20 DIAGNOSIS — E6609 Other obesity due to excess calories: Secondary | ICD-10-CM | POA: Diagnosis not present

## 2023-01-20 DIAGNOSIS — Z6834 Body mass index (BMI) 34.0-34.9, adult: Secondary | ICD-10-CM

## 2023-01-20 DIAGNOSIS — G8929 Other chronic pain: Secondary | ICD-10-CM | POA: Diagnosis not present

## 2023-01-20 DIAGNOSIS — F419 Anxiety disorder, unspecified: Secondary | ICD-10-CM | POA: Diagnosis not present

## 2023-01-20 DIAGNOSIS — M542 Cervicalgia: Secondary | ICD-10-CM | POA: Diagnosis not present

## 2023-01-20 DIAGNOSIS — F424 Excoriation (skin-picking) disorder: Secondary | ICD-10-CM

## 2023-01-20 DIAGNOSIS — F5101 Primary insomnia: Secondary | ICD-10-CM

## 2023-01-20 DIAGNOSIS — F339 Major depressive disorder, recurrent, unspecified: Secondary | ICD-10-CM | POA: Diagnosis not present

## 2023-01-20 DIAGNOSIS — M545 Low back pain, unspecified: Secondary | ICD-10-CM | POA: Diagnosis not present

## 2023-01-20 MED ORDER — BACLOFEN 10 MG PO TABS
10.0000 mg | ORAL_TABLET | Freq: Three times a day (TID) | ORAL | 2 refills | Status: DC | PRN
Start: 2023-01-20 — End: 2023-04-23

## 2023-01-20 MED ORDER — CELECOXIB 100 MG PO CAPS
100.0000 mg | ORAL_CAPSULE | Freq: Every day | ORAL | 1 refills | Status: DC
Start: 2023-01-20 — End: 2023-05-19

## 2023-01-20 NOTE — Progress Notes (Signed)
Established Patient Office Visit  Subjective   Patient ID: Hannah [name]. Polinsky, female    DOB: 1961-01-18  Age: 62 y.o. MRN: [id number]  Chief Complaint  Patient presents with   Anxiety   Depression    HPI Hannah [name] is here for follow up of anxiety and depression. She is compliant with zoloft, buspar, and trazodone. She has had has an appt with Esmond Harps and has been referred out to psychiatry for skin picking. Previously referred to [facility], but would like another referral as [facility] does not take appts and sees patient's of a first come first serve basis per her report. A new referral to [facility] was placed yesterday.   Skin picking has lessened some. She has been playing come card games on her gameboy to prevent some of the picking. She has been using bactroban and denies current signs of infection.   She is sleeping and feels like her anxiety and depression is well managed currently.    Having some roommate trouble and disputes. Skin picking has lessened somes. No signs of infection. Has bene using bactroban.   Needs refills on bacloen and celebrex.   She has lost 7 lbs since her last visit. She has been portioning her food out. Her roommate has also been cooking regularly so she has been eating less processed and more fresh vegetables lately.   She has been 6-8 bottles of water a day. Denies dizziness or lightheadedness. She does monitor her BP at home.      01/20/2023    9:15 AM 12/09/2022    9:02 AM 11/23/2022   10:02 AM  Depression screen PHQ 2/9  Decreased Interest 0 0 0  Down, Depressed, Hopeless 1 0 0  PHQ - 2 Score 1 0 0  Altered sleeping 0 0 0  Tired, decreased energy 0 1 1  Change in appetite 1 1 1   Feeling bad or failure about yourself  0 0 0  Trouble concentrating 0 0 0  Moving slowly or fidgety/restless 0 0 0  Suicidal thoughts 0 0 0  PHQ-9 Score 2 2 2   Difficult doing work/chores Somewhat difficult Not difficult at all       01/20/2023     9:15 AM 12/09/2022    9:02 AM 11/23/2022   10:02 AM 10/08/2022    8:01 AM  GAD 7 : Generalized Anxiety Score  Nervous, Anxious, on Edge 0 0 1 0  Control/stop worrying 0 0 1 0  Worry too much - different things 0 0 1 1  Trouble relaxing 0 0 1 0  Restless 0 0 0 0  Easily annoyed or irritable 1 1 1  0  Afraid - awful might happen 0 0 1 0  Total GAD 7 Score 1 1 6 1   Anxiety Difficulty Somewhat difficult Not difficult at all  Not difficult at all       ROS As per HPI.    Objective:     BP 98/68   Pulse 71   Temp 98.3 F (36.8 C) (Temporal)   Ht 5\' 3"  (1.6 m)   Wt 196 lb 8 oz (89.1 kg)   SpO2 95%   BMI 34.81 kg/m  Wt Readings from Last 3 Encounters:  01/20/23 196 lb 8 oz (89.1 kg)  12/09/22 203 lb 9.6 oz (92.4 kg)  11/10/22 190 lb (86.2 kg)   BP Readings from Last 3 Encounters:  01/20/23 98/68  12/09/22 105/61  10/08/22 101/61     Physical  Exam Vitals and nursing note reviewed.  Constitutional:      General: She is not in acute distress.    Appearance: She is obese. She is not ill-appearing, toxic-appearing or diaphoretic.  Cardiovascular:     Rate and Rhythm: Normal rate and regular rhythm.     Heart sounds: Normal heart sounds. No murmur heard. Pulmonary:     Effort: Pulmonary effort is normal. No respiratory distress.     Breath sounds: Normal breath sounds. No stridor. No wheezing.  Abdominal:     General: There is no distension.     Palpations: Abdomen is soft.     Tenderness: There is no abdominal tenderness. There is no guarding or rebound.  Musculoskeletal:     Cervical back: Neck supple. No rigidity.     Right lower leg: No edema.     Left lower leg: No edema.  Skin:    General: Skin is warm and dry.     Findings: Wound present.     Comments: Numerous small wounds to abdomen and bilateral lower legs consistent with skin picking. No current signs of infection.    Neurological:     General: No focal deficit present.     Mental Status: She is alert and  oriented to person, place, and time.  Psychiatric:        Mood and Affect: Mood normal.        Behavior: Behavior normal.      No results found for any visits on 01/20/23.    The 10-year ASCVD risk score (Arnett DK, et al., 2019) is: 2%    Assessment & Plan:   Hannah [name] was seen today for anxiety and depression.  Diagnoses and all orders for this visit:  Depression, recurrent (HCC) Anxiety Skin-picking disorder New referral to [facility] has been placed.   Class 1 obesity due to excess calories with serious comorbidity and body mass index (BMI) of 34.0 to 34.9 in adult Down 7 lbs since last visit. Discussed my fitness pal.   Chronic bilateral low back pain without sciatica Refill provided.  -     celecoxib (CELEBREX) 100 MG capsule; Take 1 capsule (100 mg total) by mouth daily.  Chronic neck pain Refill provided.  -     baclofen (LIORESAL) 10 MG tablet; Take 1 tablet (10 mg total) by mouth 3 (three) times daily as needed. for muscle spams  Primary insomnia Continue trazodone.    Return in about 7 weeks (around 03/08/2023) for chronic follow up.   The patient indicates understanding of these issues and agrees with the plan.  Gabriel Earing, FNP

## 2023-01-26 ENCOUNTER — Encounter: Payer: Self-pay | Admitting: Nurse Practitioner

## 2023-01-26 ENCOUNTER — Ambulatory Visit: Payer: Medicare Other | Admitting: Nurse Practitioner

## 2023-01-26 VITALS — BP 110/71 | HR 86 | Temp 98.0°F | Ht 63.0 in | Wt 196.2 lb

## 2023-01-26 DIAGNOSIS — S80861A Insect bite (nonvenomous), right lower leg, initial encounter: Secondary | ICD-10-CM | POA: Diagnosis not present

## 2023-01-26 DIAGNOSIS — W57XXXA Bitten or stung by nonvenomous insect and other nonvenomous arthropods, initial encounter: Secondary | ICD-10-CM | POA: Diagnosis not present

## 2023-01-26 MED ORDER — DOXYCYCLINE HYCLATE 100 MG PO TABS
100.0000 mg | ORAL_TABLET | Freq: Two times a day (BID) | ORAL | 0 refills | Status: DC
Start: 2023-01-26 — End: 2023-02-12

## 2023-01-26 NOTE — Progress Notes (Signed)
Acute Office Visit  Subjective:     Patient ID: Hannah [name], female    DOB: 1960/10/12, 62 y.o.   MRN: [id number]  Chief Complaint  Patient presents with   sore on leg    Sore on back of right leg for about 5 days. States it hurts, was doing yard work last week and noticed spot after that    HPI Tick Bite Initial Encounter Hannah [name] present to day for an acute visit for concerns for possible tick bite. She was outside last week doing yard and notice a red spot on her lower leg. Tick bite on the right lower leg that occurred approximately 5 days ago. The initial bite site was a small red spot, which has since evolved into a larger, erythematous rash with central clearing, resembling a "bull's-eye." The patient reports mild itching and occasional pain at the site but denies significant swelling. There are no associated symptoms such as fever, chills, or joint pain. The patient recently engaged in outdoor activities in wooded areas where ticks are prevalent. They have applied over-the-counter anti-itch cream with minimal relief and are concerned about the potential for Lyme disease, seeking guidance on further evaluation and treatment. She had multiple open areas on her legs at different stages of healing " I am a picker". She has an upcoming appointment with mental health  Active Ambulatory Problems    Diagnosis Date Noted   Anxiety    Chronic bilateral low back pain without sciatica 01/15/2020   Primary insomnia 07/15/2020   Controlled substance agreement signed 07/15/2020   Depression, recurrent (HCC) 07/15/2020   Generalized edema 07/15/2020   Unilateral primary osteoarthritis, left knee 10/23/2020   Constipation 11/06/2020   Pure hypercholesterolemia 01/15/2021   Chronic neck pain 01/15/2021   Iron deficiency anemia 04/16/2021   Gastroesophageal reflux disease without esophagitis 10/09/2021   Class 1 obesity due to excess calories with serious comorbidity and body mass  index (BMI) of 32.0 to 32.9 in adult 04/16/2022   Cervical radiculopathy 10/12/2022   Skin-picking disorder 10/12/2022   Tick bite of right lower leg 01/26/2023   Resolved Ambulatory Problems    Diagnosis Date Noted   Moderate episode of recurrent major depressive disorder (HCC) 08/04/2017   Morbid obesity (HCC) 01/15/2020   Depression, major, recurrent, in remission (HCC) 04/16/2021   Past Medical History:  Diagnosis Date   Acquired dilation of bile duct    Alcohol abuse    Anemia 06/27/2000   Arthritis 05/14/1998   Blood type O+    Chronic neck and back pain    Compulsive skin picking 1998   Depression    Drug abuse (HCC)    GERD (gastroesophageal reflux disease)    History of iron deficiency anemia    Hyperlipidemia    Insomnia    Migraines    Osteoporosis 07/08/2017   Vitamin D deficiency     Review of Systems  Constitutional:  Negative for chills and fever.  Eyes:  Negative for blurred vision and photophobia.  Respiratory:  Negative for shortness of breath and wheezing.   Cardiovascular:  Negative for chest pain and leg swelling.  Gastrointestinal:  Negative for nausea and vomiting.  Musculoskeletal:  Negative for falls and myalgias.  Skin:  Positive for itching and rash.  Neurological:  Negative for dizziness, weakness and headaches.  Endo/Heme/Allergies:  Negative for polydipsia. Does not bruise/bleed easily.  Psychiatric/Behavioral:  Negative for suicidal ideas. The patient does not have insomnia.    Negative  unless indicated in HPI       01/20/2023    9:15 AM 12/09/2022    9:02 AM 11/23/2022   10:02 AM  PHQ9 SCORE ONLY  PHQ-9 Total Score 2 2 2     Objective:    BP 110/71   Pulse 86   Temp 98 F (36.7 C) (Temporal)   Ht 5\' 3"  (1.6 m)   Wt 196 lb 3.2 oz (89 kg)   SpO2 97%   BMI 34.76 kg/m  BP Readings from Last 3 Encounters:  01/26/23 110/71  01/20/23 98/68  12/09/22 105/61   Wt Readings from Last 3 Encounters:  01/26/23 196 lb 3.2 oz (89 kg)   01/20/23 196 lb 8 oz (89.1 kg)  12/09/22 203 lb 9.6 oz (92.4 kg)      Physical Exam Vitals and nursing note reviewed.  Constitutional:      Appearance: Normal appearance. She is obese.  HENT:     Head: Normocephalic and atraumatic.  Eyes:     General: No scleral icterus.    Extraocular Movements: Extraocular movements intact.     Pupils: Pupils are equal, round, and reactive to light.  Cardiovascular:     Rate and Rhythm: Normal rate and regular rhythm.  Pulmonary:     Effort: Pulmonary effort is normal.     Breath sounds: Normal breath sounds.  Skin:    General: Skin is warm and dry.     Findings: Erythema and rash present. Rash is purpuric.  Neurological:     Mental Status: She is alert and oriented to person, place, and time. Mental status is at baseline.  Psychiatric:        Mood and Affect: Mood normal.        Behavior: Behavior normal.        Thought Content: Thought content normal.        Judgment: Judgment normal.     No results found for any visits on 01/26/23.      Assessment & Plan:  Tick bite of right lower leg, initial encounter -     Alpha-Gal Panel -     Lyme Disease Serology w/Reflex -     Doxycycline Hyclate; Take 1 tablet (100 mg total) by mouth 2 (two) times daily.  Dispense: 20 tablet; Refill: 0  Hannah [name] is 62 yrs old Caucasian female seen for tick bite, no acute distress Labs: lyme and Alpha Gal titer Start Doxy 100 mg 1-tab BID for 10 days  Pt to report any new fever, joint pain, or rash -Wear protective clothing while outside- Long sleeves and long pants -Put insect repellent on all exposed skin and along clothing -Take a shower as soon as possible after being outside    The above assessment and management plan was discussed with the patient. The patient verbalized understanding of and has agreed to the management plan. Patient is aware to call the clinic if they develop any new symptoms or if symptoms persist or worsen. Patient is aware when  to return to the clinic for a follow-up visit. Patient educated on when it is appropriate to go to the emergency department.   Return if symptoms worsen or fail to improve.  Arrie Aran Santa Lighter, DNP [facility] [address] [place], [place] [address] [phone]

## 2023-02-02 ENCOUNTER — Other Ambulatory Visit: Payer: Self-pay

## 2023-02-02 DIAGNOSIS — S80861A Insect bite (nonvenomous), right lower leg, initial encounter: Secondary | ICD-10-CM

## 2023-02-02 LAB — ALPHA-GAL PANEL

## 2023-02-02 LAB — LYME DISEASE SEROLOGY W/REFLEX

## 2023-02-03 ENCOUNTER — Other Ambulatory Visit: Payer: Medicare Other

## 2023-02-05 ENCOUNTER — Encounter (HOSPITAL_COMMUNITY): Payer: Self-pay

## 2023-02-12 ENCOUNTER — Ambulatory Visit (INDEPENDENT_AMBULATORY_CARE_PROVIDER_SITE_OTHER): Payer: Medicare Other | Admitting: Family Medicine

## 2023-02-12 ENCOUNTER — Ambulatory Visit (INDEPENDENT_AMBULATORY_CARE_PROVIDER_SITE_OTHER): Payer: Medicare Other

## 2023-02-12 ENCOUNTER — Encounter: Payer: Self-pay | Admitting: Family Medicine

## 2023-02-12 VITALS — BP 102/69 | HR 62 | Temp 98.6°F | Ht 63.0 in | Wt 203.0 lb

## 2023-02-12 DIAGNOSIS — S80861D Insect bite (nonvenomous), right lower leg, subsequent encounter: Secondary | ICD-10-CM | POA: Diagnosis not present

## 2023-02-12 DIAGNOSIS — M79602 Pain in left arm: Secondary | ICD-10-CM

## 2023-02-12 DIAGNOSIS — R42 Dizziness and giddiness: Secondary | ICD-10-CM

## 2023-02-12 DIAGNOSIS — R5383 Other fatigue: Secondary | ICD-10-CM

## 2023-02-12 DIAGNOSIS — M79605 Pain in left leg: Secondary | ICD-10-CM

## 2023-02-12 DIAGNOSIS — M50121 Cervical disc disorder at C4-C5 level with radiculopathy: Secondary | ICD-10-CM | POA: Diagnosis not present

## 2023-02-12 DIAGNOSIS — Z862 Personal history of diseases of the blood and blood-forming organs and certain disorders involving the immune mechanism: Secondary | ICD-10-CM

## 2023-02-12 DIAGNOSIS — M4722 Other spondylosis with radiculopathy, cervical region: Secondary | ICD-10-CM | POA: Diagnosis not present

## 2023-02-12 DIAGNOSIS — W57XXXD Bitten or stung by nonvenomous insect and other nonvenomous arthropods, subsequent encounter: Secondary | ICD-10-CM | POA: Diagnosis not present

## 2023-02-12 DIAGNOSIS — R5381 Other malaise: Secondary | ICD-10-CM

## 2023-02-12 MED ORDER — DOXYCYCLINE HYCLATE 100 MG PO TABS
100.0000 mg | ORAL_TABLET | Freq: Two times a day (BID) | ORAL | 0 refills | Status: AC
Start: 2023-02-12 — End: 2023-02-19

## 2023-02-12 MED ORDER — PREDNISONE 20 MG PO TABS: 40.0000 mg | ORAL_TABLET | Freq: Every day | ORAL | 0 refills | Status: AC

## 2023-02-12 NOTE — Progress Notes (Unsigned)
Acute Office Visit  Subjective:     Patient ID: Hannah Rizo. [name], female    DOB: Oct 12, 1960, 62 y.o.   MRN: [id number]  Chief Complaint  Patient presents with   Fatigue    HPI Patient is in today for    Tick bite of right lower leg around 01/19/23. She denies a bulls eye rash or other rash. She was started on doxycycline for concerns of lyme disease on 01/26/23 for 10. She finished this on 02/05/23. On 02/06/23 she woke up feeling "foggy" with difficulty concentrating and problem solving. Has been forgetful. She has felt off balance. She has been stumbling if she lays down or sits for a period of time. Lightheadedness when standing.   Denies neck pain. She is having pain down her medial left arm from mid upper arm to mid forearm. This is constant and achy. She has been dropping things frequently from her left hand. Objects seems to slip out of her hand sometimes. She is also having intermittent achy pain that radiates down the lateral upper and lower leg. No fever, chills, rash.   She has felt fatigued. Yesterday she felt exhausted after going to the store. Denies chest pain, shortness of breath, edema.   She has a right sided headache. Reports some blurred vision in left. Sharp pain. Headache went away after 10 minutes. Denies facial droop, changes in speech. Denies numbness or tingling.   Hx of lumbar surgery for herniated disc. Hx of chronic neck and lower back pain.   ROS As per HPI.     Objective:    BP 102/69   Pulse 62   Temp 98.6 F (37 C) (Temporal)   Ht 5\' 3"  (1.6 m)   Wt 203 lb (92.1 kg)   SpO2 97%   BMI 35.96 kg/m  BP Readings from Last 3 Encounters:  02/12/23 102/69  01/26/23 110/71  01/20/23 98/68   Wt Readings from Last 3 Encounters:  02/12/23 203 lb (92.1 kg)  01/26/23 196 lb 3.2 oz (89 kg)  01/20/23 196 lb 8 oz (89.1 kg)       Physical Exam Vitals and nursing note reviewed.  Constitutional:      General: She is not in acute distress.     Appearance: She is obese. She is not ill-appearing, toxic-appearing or diaphoretic.  HENT:     Head: Normocephalic and atraumatic.     Nose: Nose normal.     Mouth/Throat:     Mouth: Mucous membranes are moist.     Pharynx: Oropharynx is clear. No oropharyngeal exudate or posterior oropharyngeal erythema.  Eyes:     Extraocular Movements: Extraocular movements intact.     Pupils: Pupils are equal, round, and reactive to light.  Cardiovascular:     Rate and Rhythm: Normal rate and regular rhythm.     Heart sounds: Normal heart sounds. No murmur heard. Pulmonary:     Effort: Pulmonary effort is normal. No respiratory distress.     Breath sounds: Normal breath sounds. No wheezing.  Abdominal:     General: There is no distension.     Palpations: Abdomen is soft.     Tenderness: There is no abdominal tenderness. There is no guarding or rebound.  Musculoskeletal:     Left upper arm: No swelling, edema, deformity, lacerations, tenderness or bony tenderness.     Left elbow: Normal range of motion. Tenderness (ganglion cyst present in atecubital with tenderness, no eyrtema or exudate) present.     Left  forearm: No swelling, edema, tenderness or bony tenderness.     Left wrist: No swelling, deformity or tenderness. Normal range of motion.     Left hand: No swelling, deformity or tenderness. Normal range of motion. Normal strength. Normal capillary refill.     Cervical back: Normal range of motion and neck supple. No edema, erythema or rigidity. Muscular tenderness present. No spinous process tenderness. Normal range of motion.     Lumbar back: No swelling, edema, signs of trauma or bony tenderness. Negative right straight leg raise test and negative left straight leg raise test.     Right lower leg: No edema.     Left lower leg: No edema.  Skin:    General: Skin is warm and dry.     Findings: No rash.  Neurological:     General: No focal deficit present.     Mental Status: She is alert and  oriented to person, place, and time.     Cranial Nerves: No cranial nerve deficit.     Sensory: No sensory deficit.     Motor: No weakness.     Coordination: Coordination normal.     Gait: Gait normal.  Psychiatric:        Mood and Affect: Mood normal.        Behavior: Behavior normal.     No results found for any visits on 02/12/23.      Assessment & Plan:   Lekita was seen today for fatigue.  Diagnoses and all orders for this visit:  Tick bite of right lower leg, subsequent encounter -     Lyme Disease Serology w/Reflex -     Alpha-Gal Panel -     doxycycline (VIBRA-TABS) 100 MG tablet; Take 1 tablet (100 mg total) by mouth 2 (two) times daily for 7 days.  Malaise and fatigue -     Anemia Profile B -     CMP14+EGFR -     TSH  Left arm pain -     DG Cervical Spine 2 or 3 views; Future -     predniSONE (DELTASONE) 20 MG tablet; Take 2 tablets (40 mg total) by mouth daily with breakfast for 5 days.  Left leg pain -     predniSONE (DELTASONE) 20 MG tablet; Take 2 tablets (40 mg total) by mouth daily with breakfast for 5 days.  Dizziness -     Anemia Profile B -     CMP14+EGFR -     TSH  History of anemia -     Anemia Profile B  Labs pending as above for possible etiologies. Normal neuro exam today, low suspicion for CVA. Will continue doxycyline for an additional 7 days as there is a concern for lyme after review note from 01/26/23. Also discussed cervical radiculopathy as cause of arm and leg pain and potentially dizziness base on her xray today in the office and history of chronic neck pain. Will treat with prednisone burst today and have her continue her NSAIDS. Negative orthostatics today. Discussed referral to ortho pending radiology report on xray. Will notify patient of labs results when available. Strict return precautions given.   The patient indicates understanding of these issues and agrees with the plan.  Gabriel Earing, FNP

## 2023-02-15 ENCOUNTER — Other Ambulatory Visit: Payer: Self-pay | Admitting: Family Medicine

## 2023-02-15 ENCOUNTER — Encounter: Payer: Self-pay | Admitting: Family Medicine

## 2023-02-15 DIAGNOSIS — R29898 Other symptoms and signs involving the musculoskeletal system: Secondary | ICD-10-CM

## 2023-02-15 DIAGNOSIS — M5030 Other cervical disc degeneration, unspecified cervical region: Secondary | ICD-10-CM

## 2023-02-15 DIAGNOSIS — M5412 Radiculopathy, cervical region: Secondary | ICD-10-CM

## 2023-02-15 LAB — ALPHA-GAL PANEL
Allergen Lamb IgE: <0.1 kU/L
Beef IgE: <0.1 kU/L
IgE (Immunoglobulin E), Serum: 33 [IU]/mL (ref 6–495)
O215-IgE Alpha-Gal: <0.1 kU/L
Pork IgE: <0.1 kU/L

## 2023-02-15 LAB — CMP14+EGFR
ALT: 22 IU/L (ref 0–32)
AST: 25 IU/L (ref 0–40)
Albumin: 3.9 g/dL (ref 3.9–4.9)
Alkaline Phosphatase: 96 IU/L (ref 44–121)
BUN/Creatinine Ratio: 19 (ref 12–28)
BUN: 21 mg/dL (ref 8–27)
Bilirubin Total: 0.2 mg/dL (ref 0.0–1.2)
CO2: 23 mmol/L (ref 20–29)
Calcium: 9.7 mg/dL (ref 8.7–10.3)
Chloride: 105 mmol/L (ref 96–106)
Creatinine, Ser: 1.09 mg/dL — ABNORMAL HIGH (ref 0.57–1.00)
Globulin, Total: 2.2 g/dL (ref 1.5–4.5)
Glucose: 101 mg/dL — ABNORMAL HIGH (ref 70–99)
Potassium: 4.4 mmol/L (ref 3.5–5.2)
Sodium: 140 mmol/L (ref 134–144)
Total Protein: 6.1 g/dL (ref 6.0–8.5)
eGFR: 58 mL/min/{1.73_m2} — ABNORMAL LOW (ref >59)

## 2023-02-15 LAB — LYME IGG/IGM
Lyme IgG EIA: NEGATIVE
Lyme IgM EIA: NEGATIVE

## 2023-02-15 LAB — ANEMIA PROFILE B
Basophils Absolute: 0.1 10*3/uL (ref 0.0–0.2)
Basos: 1 %
EOS (ABSOLUTE): 0.2 10*3/uL (ref 0.0–0.4)
Eos: 3 %
Ferritin: 193 ng/mL — ABNORMAL HIGH (ref 15–150)
Folate: 17.9 ng/mL (ref >3.0)
Hematocrit: 41.7 % (ref 34.0–46.6)
Hemoglobin: 13.1 g/dL (ref 11.1–15.9)
Immature Grans (Abs): 0 10*3/uL (ref 0.0–0.1)
Immature Granulocytes: 0 %
Iron Saturation: 23 % (ref 15–55)
Iron: 63 ug/dL (ref 27–139)
Lymphocytes Absolute: 1.2 10*3/uL (ref 0.7–3.1)
Lymphs: 16 %
MCH: 31 pg (ref 26.6–33.0)
MCHC: 31.4 g/dL — ABNORMAL LOW (ref 31.5–35.7)
MCV: 99 fL — ABNORMAL HIGH (ref 79–97)
Monocytes Absolute: 0.5 10*3/uL (ref 0.1–0.9)
Monocytes: 6 %
Neutrophils Absolute: 5.6 10*3/uL (ref 1.4–7.0)
Neutrophils: 74 %
Platelets: 151 10*3/uL (ref 150–450)
RBC: 4.22 x10E6/uL (ref 3.77–5.28)
RDW: 11.9 % (ref 11.7–15.4)
Retic Ct Pct: 2.1 % (ref 0.6–2.6)
Total Iron Binding Capacity: 277 ug/dL (ref 250–450)
UIBC: 214 ug/dL (ref 118–369)
Vitamin B-12: 341 pg/mL (ref 232–1245)
WBC: 7.6 10*3/uL (ref 3.4–10.8)

## 2023-02-15 LAB — LYME DISEASE SEROLOGY W/REFLEX: Lyme Total Antibody EIA: POSITIVE

## 2023-03-05 ENCOUNTER — Telehealth (HOSPITAL_COMMUNITY): Payer: Self-pay

## 2023-03-05 NOTE — Telephone Encounter (Signed)
Called pt to confirm 03/09/23 appt no answer left vm

## 2023-03-05 NOTE — Telephone Encounter (Signed)
Appt confirmed

## 2023-03-09 ENCOUNTER — Ambulatory Visit (HOSPITAL_COMMUNITY): Payer: Medicare Other | Admitting: Psychiatry

## 2023-03-09 ENCOUNTER — Encounter (HOSPITAL_COMMUNITY): Payer: Self-pay | Admitting: Psychiatry

## 2023-03-09 DIAGNOSIS — F5104 Psychophysiologic insomnia: Secondary | ICD-10-CM

## 2023-03-09 DIAGNOSIS — D508 Other iron deficiency anemias: Secondary | ICD-10-CM

## 2023-03-09 DIAGNOSIS — D509 Iron deficiency anemia, unspecified: Secondary | ICD-10-CM | POA: Diagnosis not present

## 2023-03-09 DIAGNOSIS — F411 Generalized anxiety disorder: Secondary | ICD-10-CM

## 2023-03-09 DIAGNOSIS — F5022 Bulimia nervosa, moderate: Secondary | ICD-10-CM | POA: Diagnosis not present

## 2023-03-09 DIAGNOSIS — F5101 Primary insomnia: Secondary | ICD-10-CM

## 2023-03-09 DIAGNOSIS — F424 Excoriation (skin-picking) disorder: Secondary | ICD-10-CM

## 2023-03-09 DIAGNOSIS — F502 Bulimia nervosa, unspecified: Secondary | ICD-10-CM | POA: Insufficient documentation

## 2023-03-09 DIAGNOSIS — F5025 Bulimia nervosa, in remission: Secondary | ICD-10-CM | POA: Insufficient documentation

## 2023-03-09 DIAGNOSIS — F331 Major depressive disorder, recurrent, moderate: Secondary | ICD-10-CM
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
## 2023-03-09 NOTE — Patient Instructions (Signed)
We added N-acetylcysteine (NAC) to your regimen today.  You can find this on [employer] and the starting dose will be 1200 mg daily.  If after tolerating the starting dose for 2 weeks you can increase to 2400 mg if you are not having side effects which are typically upset stomach.  You may also want to get finger thimbles to make it more difficult to pick.  I will coordinate with your PCP for nutrition referral and you may need to increase your iron supplement for the restless legs.  They may also want a sleep study.  Habit reversal therapy can be very helpful for cognitive behavioral therapy for skin picking disorder.  I will begin looking for a psychiatrist in [place] now in order to not have a disruption in your care.

## 2023-03-10 ENCOUNTER — Ambulatory Visit: Payer: Medicare Other | Admitting: Family Medicine

## 2023-03-10 ENCOUNTER — Telehealth: Payer: Self-pay | Admitting: Family Medicine

## 2023-03-10 DIAGNOSIS — M5030 Other cervical disc degeneration, unspecified cervical region: Secondary | ICD-10-CM

## 2023-03-10 DIAGNOSIS — M545 Low back pain, unspecified: Secondary | ICD-10-CM

## 2023-03-10 DIAGNOSIS — Z79899 Other long term (current) drug therapy: Secondary | ICD-10-CM

## 2023-03-10 NOTE — Telephone Encounter (Signed)
  Prescription Request  03/10/2023  Is this a "Controlled Substance" medicine? Oxycodone HCl 10 MG TABS   Have you seen your PCP in the last 2 weeks? Pt had an apt today but Hannah [name] called out. PT says that she is out and cannot wait until next apt on 03/15/2023 for refill. She wants to talk to nurse  If YES, route message to pool  -  If NO, patient needs to be scheduled for appointment.  What is the name of the medication or equipment? Oxycodone HCl 10 MG TABS   Have you contacted your pharmacy to request a refill? no   Which pharmacy would you like this sent to? [facility]   Patient notified that their request is being sent to the clinical staff for review and that they should receive a response within 2 business days.

## 2023-03-11 ENCOUNTER — Ambulatory Visit (INDEPENDENT_AMBULATORY_CARE_PROVIDER_SITE_OTHER): Payer: Medicare Other | Admitting: Family Medicine

## 2023-03-11 ENCOUNTER — Encounter: Payer: Self-pay | Admitting: Family Medicine

## 2023-03-11 ENCOUNTER — Other Ambulatory Visit: Payer: Self-pay | Admitting: Family Medicine

## 2023-03-11 VITALS — BP 112/62 | HR 63 | Temp 98.3°F | Ht 63.0 in | Wt 199.1 lb

## 2023-03-11 DIAGNOSIS — Z79899 Other long term (current) drug therapy: Secondary | ICD-10-CM

## 2023-03-11 DIAGNOSIS — F424 Excoriation (skin-picking) disorder: Secondary | ICD-10-CM

## 2023-03-11 DIAGNOSIS — K219 Gastro-esophageal reflux disease without esophagitis: Secondary | ICD-10-CM | POA: Diagnosis not present

## 2023-03-11 DIAGNOSIS — G2581 Restless legs syndrome: Secondary | ICD-10-CM

## 2023-03-11 DIAGNOSIS — F419 Anxiety disorder, unspecified: Secondary | ICD-10-CM

## 2023-03-11 DIAGNOSIS — M5412 Radiculopathy, cervical region: Secondary | ICD-10-CM | POA: Diagnosis not present

## 2023-03-11 DIAGNOSIS — M545 Low back pain, unspecified: Secondary | ICD-10-CM

## 2023-03-11 DIAGNOSIS — G8929 Other chronic pain: Secondary | ICD-10-CM

## 2023-03-11 DIAGNOSIS — M542 Cervicalgia: Secondary | ICD-10-CM

## 2023-03-11 DIAGNOSIS — F339 Major depressive disorder, recurrent, unspecified: Secondary | ICD-10-CM | POA: Diagnosis not present

## 2023-03-11 DIAGNOSIS — J301 Allergic rhinitis due to pollen: Secondary | ICD-10-CM

## 2023-03-11 DIAGNOSIS — M5030 Other cervical disc degeneration, unspecified cervical region: Secondary | ICD-10-CM

## 2023-03-11 DIAGNOSIS — R0683 Snoring: Secondary | ICD-10-CM

## 2023-03-11 MED ORDER — MUPIROCIN 2 % EX OINT
1.0000 | TOPICAL_OINTMENT | Freq: Two times a day (BID) | CUTANEOUS | 0 refills | Status: DC
Start: 2023-03-11 — End: 2023-08-09

## 2023-03-11 MED ORDER — OXYCODONE HCL 10 MG PO TABS
10.0000 mg | ORAL_TABLET | Freq: Three times a day (TID) | ORAL | 0 refills | Status: AC | PRN
Start: 2023-04-10 — End: 2023-05-10

## 2023-03-11 MED ORDER — OXYCODONE HCL 10 MG PO TABS
10.0000 mg | ORAL_TABLET | Freq: Three times a day (TID) | ORAL | 0 refills | Status: DC | PRN
Start: 2023-05-10 — End: 2023-05-19

## 2023-03-11 MED ORDER — OXYCODONE HCL 10 MG PO TABS
10.0000 mg | ORAL_TABLET | Freq: Three times a day (TID) | ORAL | 0 refills | Status: DC | PRN
Start: 2023-03-11 — End: 2023-03-11

## 2023-03-11 MED ORDER — LEVOCETIRIZINE DIHYDROCHLORIDE 5 MG PO TABS: 5.0000 mg | ORAL_TABLET | Freq: Every evening | ORAL | 3 refills | Status: AC

## 2023-03-11 NOTE — Progress Notes (Signed)
Established Patient Office Visit  Subjective   Patient ID: Hannah [name]. Draves, female    DOB: Jul 01, 1960  Age: 62 y.o. MRN: [id number]  Chief Complaint  Patient presents with   Medical Management of Chronic Issues   Depression    HPI  Obesity She has lost a few pounds since her last visit.   2. Cough Dry cough for last 3 weeks, worse during the day at morning. Denies fever, shortness of breath, chest pain, sore throat, or ear pain. Runny nose present.    3. Chronic neck and back pain She takes baclofen prn.Has upcoming appt with neurosurgery for cervical radiculopathy. No red flag symptoms.  Pain assessment: Cause of pain- old trauma from Forklift accident Pain location- lower back and neck Pain on scale of 1-10- 8/10 without medications, 5/10 with medications Frequency- daily What increases pain-activity like house work or lifting objects What makes pain Better-resting and medication Any change in general medical condition-none   Current opioids rx- Oxycodone 10 mg TID PRN # meds rx- 90 Effectiveness of current meds-good Adverse reactions from pain meds-none Morphine equivalent- 45 MME   Pill count performed-No Last drug screen - 10/09/21 Urine drug screen today- no Was the NCC[facility] reviewed- yes             If yes were their any concerning findings? - no CSA signed  - 10/09/21  3. GERD  Compliant with medications - Yes Current medications - protonix, pepcid prn Cough - No Sore throat - No Voice change - No Hemoptysis - No Dysphagia or dyspepsia - No dysphagia. She has dyspepsia if she doesn't take medications.  Water brash - No Red Flags (weight loss, hematochezia, melena, weight loss, early satiety, fevers, odynophagia, or persistent vomiting) - No  4. Depression/anxiety/insomnia Had appt with [facility] yesterday for consult. Will continue current regimen and recommended N-acetylcysteine for skin picking. She plans to order this. Hasn't sleep well the last few nights.  Taking trazodone for sleep usually with benefit. She does snore. Doesn't feel particularly tired during the day. She does sometimes fall sleep if sitting down.   5. RLS Complaint with iron supplement. Recent ferritin was 190s. Doesn't wish to try medication for this right now.      03/11/2023    1:08 PM 03/09/2023    9:02 AM 01/20/2023    9:15 AM  Depression screen PHQ 2/9  Decreased Interest 0 2 0  Down, Depressed, Hopeless 0 2 1  PHQ - 2 Score 0 4 1  Altered sleeping 0 1 0  Tired, decreased energy 0 1 0  Change in appetite 1 2 1   Feeling bad or failure about yourself  0 2 0  Trouble concentrating 0 0 0  Moving slowly or fidgety/restless 0 3 0  Suicidal thoughts 0 0 0  PHQ-9 Score 1 13 2   Difficult doing work/chores Not difficult at all Somewhat difficult Somewhat difficult       03/11/2023    1:08 PM 01/20/2023    9:15 AM 12/09/2022    9:02 AM 11/23/2022   10:02 AM  GAD 7 : Generalized Anxiety Score  Nervous, Anxious, on Edge 1 0 0 1  Control/stop worrying 0 0 0 1  Worry too much - different things 0 0 0 1  Trouble relaxing 0 0 0 1  Restless 0 0 0 0  Easily annoyed or irritable 0 1 1 1   Afraid - awful might happen 0 0 0 1  Total GAD 7 Score  1 1 1 6   Anxiety Difficulty Not difficult at all Somewhat difficult Not difficult at all       Patient Active Problem List   Diagnosis Date Noted   Bulimia nervosa status post Roux-en-Y bypass 03/09/2023   Tick bite of right lower leg 01/26/2023   Cervical radiculopathy 10/12/2022   Skin-picking disorder 10/12/2022   Class 1 obesity due to excess calories with serious comorbidity and body mass index (BMI) of 32.0 to 32.9 in adult 04/16/2022   Gastroesophageal reflux disease without esophagitis 10/09/2021   Iron deficiency anemia 04/16/2021   Pure hypercholesterolemia 01/15/2021   Chronic neck pain 01/15/2021   Constipation 11/06/2020   Unilateral primary osteoarthritis, left knee 10/23/2020   Primary insomnia with snoring and  restless legs 07/15/2020   Controlled substance agreement signed 07/15/2020   Major depressive disorder, recurrent episode, moderate (HCC) 07/15/2020   Generalized edema 07/15/2020   Chronic bilateral low back pain without sciatica 01/15/2020   Generalized anxiety disorder     ROS Negative unless specially indicated above in HPI.   Objective:     BP 112/62   Pulse 63   Temp 98.3 F (36.8 C) (Temporal)   Ht 5\' 3"  (1.6 m)   Wt 199 lb 2 oz (90.3 kg)   SpO2 95%   BMI 35.27 kg/m  Wt Readings from Last 3 Encounters:  03/11/23 199 lb 2 oz (90.3 kg)  02/12/23 203 lb (92.1 kg)  01/26/23 196 lb 3.2 oz (89 kg)   BP Readings from Last 3 Encounters:  03/11/23 112/62  02/12/23 102/69  01/26/23 110/71    Physical Exam Vitals and nursing note reviewed.  Constitutional:      General: She is not in acute distress.    Appearance: Normal appearance. She is not ill-appearing.  Cardiovascular:     Rate and Rhythm: Normal rate and regular rhythm.     Pulses: Normal pulses.     Heart sounds: Normal heart sounds. No murmur heard. Pulmonary:     Effort: Pulmonary effort is normal. No respiratory distress.     Breath sounds: Normal breath sounds.  Abdominal:     General: Bowel sounds are normal. There is no distension.     Palpations: Abdomen is soft. There is no mass.     Tenderness: There is no abdominal tenderness. There is no guarding or rebound.  Musculoskeletal:     Cervical back: Neck supple. No tenderness.     Right lower leg: No edema.     Left lower leg: No edema.  Lymphadenopathy:     Cervical: No cervical adenopathy.  Skin:    General: Skin is warm and dry.  Neurological:     General: No focal deficit present.     Mental Status: She is alert and oriented to person, place, and time.  Psychiatric:        Mood and Affect: Mood normal.        Behavior: Behavior normal.      No results found for any visits on 03/11/23.  Last CBC Lab Results  Component Value Date    WBC 7.6 02/12/2023   HGB 13.1 02/12/2023   HCT 41.7 02/12/2023   MCV 99 (H) 02/12/2023   MCH 31.0 02/12/2023   RDW 11.9 02/12/2023   PLT 151 02/12/2023   Last metabolic panel Lab Results  Component Value Date   GLUCOSE 101 (H) 02/12/2023   NA 140 02/12/2023   K 4.4 02/12/2023   CL 105 02/12/2023  CO2 23 02/12/2023   BUN 21 02/12/2023   CREATININE 1.09 (H) 02/12/2023   EGFR 58 (L) 02/12/2023   CALCIUM 9.7 02/12/2023   PROT 6.1 02/12/2023   ALBUMIN 3.9 02/12/2023   LABGLOB 2.2 02/12/2023   AGRATIO 1.9 07/10/2022   BILITOT 0.2 02/12/2023   ALKPHOS 96 02/12/2023   AST 25 02/12/2023   ALT 22 02/12/2023   Last lipids Lab Results  Component Value Date   CHOL 169 01/09/2022   HDL 51 01/09/2022   LDLCALC 107 (H) 01/09/2022   TRIG 56 01/09/2022   CHOLHDL 3.3 01/09/2022   Last thyroid functions Lab Results  Component Value Date   TSH 2.930 07/15/2021   T4TOTAL 5.7 07/15/2021      The 10-year ASCVD risk score (Arnett DK, et al., 2019) is: 2.9%    Assessment & Plan:   Margueritte was seen today for medical management of chronic issues and depression.  Diagnoses and all orders for this visit:  Depression, recurrent (HCC) Anxiety Skin-picking disorder Now established with [facility] for management. To continue on zoloft and buspar. Will start OTC N-acetylcysteine for skin picking. Refill of bactoban ordered to use as needed. Keep appt with Esmond Harps.  -     mupirocin ointment (BACTROBAN) 2 %; Apply 1 Application topically 2 (two) times daily.  Morbid obesity (HCC) Down a few pounds since last visit. Declined nutrition referral today as she will be moving soon.   Restless leg syndrome Discussed ferritin at goal for RLS. Not currently interested in trying medication for treatment.   Snoring Referral placed to test for OSA.  -     Ambulatory referral to Sleep Studies  Gastroesophageal reflux disease without esophagitis Well controlled on current regimen.   DDD  (degenerative disc disease), cervical Chronic neck pain Controlled substance agreement signed Stable symptoms. PDMP reviewed, no red flags. CSA and UDS are UTD. Refills provided.  -     Oxycodone HCl 10 MG TABS; Take 1 tablet (10 mg total) by mouth 3 (three) times daily as needed (severe pain). -     Oxycodone HCl 10 MG TABS; Take 1 tablet (10 mg total) by mouth 3 (three) times daily as needed (severe pain).  Cervical radiculopathy Keep appt with neurosurgery for further evaluation.   Seasonal allergic rhinitis due to pollen Try xyzal as below.  -     levocetirizine (XYZAL) 5 MG tablet; Take 1 tablet (5 mg total) by mouth every evening.  Return in about 6 weeks (around 04/22/2023) for chronic follow up.   The patient indicates understanding of these issues and agrees with the plan.  Gabriel Earing, FNP

## 2023-03-11 NOTE — Telephone Encounter (Signed)
I've sent in a fill since I had to reschedule.

## 2023-03-11 NOTE — Telephone Encounter (Signed)
Please advise, not on med list, may have expired.

## 2023-03-12 DIAGNOSIS — Z23 Encounter for immunization: Secondary | ICD-10-CM | POA: Diagnosis not present

## 2023-03-15 ENCOUNTER — Ambulatory Visit: Payer: Medicare Other | Admitting: Family Medicine

## 2023-03-17 DIAGNOSIS — M5412 Radiculopathy, cervical region: Secondary | ICD-10-CM | POA: Diagnosis not present

## 2023-03-29 ENCOUNTER — Other Ambulatory Visit: Payer: Self-pay

## 2023-03-29 ENCOUNTER — Encounter: Payer: Self-pay | Admitting: Physical Therapy

## 2023-03-29 ENCOUNTER — Ambulatory Visit: Payer: Medicare Other | Attending: Anesthesiology | Admitting: Physical Therapy

## 2023-03-29 DIAGNOSIS — M62838 Other muscle spasm: Secondary | ICD-10-CM | POA: Diagnosis not present

## 2023-03-29 DIAGNOSIS — M542 Cervicalgia: Secondary | ICD-10-CM | POA: Insufficient documentation

## 2023-03-29 NOTE — Therapy (Signed)
OUTPATIENT PHYSICAL THERAPY CERVICAL EVALUATION   Patient Name: Keagan Trezise. Bloemer MRN: [id number] DOB:[birth year], 62 y.o., female Today's Date: 03/29/2023  END OF SESSION:  PT End of Session - 03/29/23 1140     Visit Number 1    Number of Visits 12    Date for PT Re-Evaluation 06/27/23    Authorization Type FOTO.    PT Start Time [phone]    PT Stop Time 1022    PT Time Calculation (min) 51 min    Activity Tolerance Patient tolerated treatment well    Behavior During Therapy WFL for tasks assessed/performed             Past Medical History:  Diagnosis Date   Acquired dilation of bile duct    Alcohol abuse    Anemia 06/27/2000   Anxiety    Arthritis 05/14/1998   Blood type O+    Chronic neck and back pain    Compulsive skin picking 1998   Depression    Drug abuse (HCC)    GERD (gastroesophageal reflux disease)    History of iron deficiency anemia    Hyperlipidemia    Insomnia    Migraines    Osteoporosis 07/08/2017   T-score -2.6 in 2019; -2.2 on 08/05/2018   Vitamin D deficiency    Past Surgical History:  Procedure Laterality Date   ABDOMINAL HYSTERECTOMY  09/07/2006   CESAREAN SECTION  02/16/1990   CHOLECYSTECTOMY  2001   ENDOMETRIAL ABLATION W/ NOVASURE     GASTRIC BYPASS  1998   HEMORRHOID SURGERY  2003   HERNIA REPAIR  2004   JOINT REPLACEMENT  08/21/2016   KNEE ARTHROSCOPY Right 2008   MOUTH SURGERY     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   Patient Active Problem List   Diagnosis Date Noted   Bulimia nervosa status post Roux-en-Y bypass 03/09/2023   Tick bite of right lower leg 01/26/2023   Cervical radiculopathy 10/12/2022   Skin-picking disorder 10/12/2022   Class 1 obesity due to excess calories with serious comorbidity and body mass index (BMI) of 32.0 to 32.9 in adult 04/16/2022   Gastroesophageal reflux disease without esophagitis 10/09/2021   Iron deficiency anemia 04/16/2021   Pure hypercholesterolemia 01/15/2021   Chronic neck pain  01/15/2021   Constipation 11/06/2020   Unilateral primary osteoarthritis, left knee 10/23/2020   Primary insomnia with snoring and restless legs 07/15/2020   Controlled substance agreement signed 07/15/2020   Major depressive disorder, recurrent episode, moderate (HCC) 07/15/2020   Generalized edema 07/15/2020   Morbid obesity (HCC) 01/15/2020   Chronic bilateral low back pain without sciatica 01/15/2020   Generalized anxiety disorder     REFERRING PROVIDER: Ceasar Lund MD  REFERRING DIAG: Cervicalgia.  THERAPY DIAG:  Cervicalgia  Other muscle spasm  Rationale for Evaluation and Treatment: Rehabilitation  ONSET DATE: ~2 months.  SUBJECTIVE:  SUBJECTIVE STATEMENT: The patient presents to the clinic with c/o left-sided neck pain and radiation of pain into her left biceps, medial elbow region and thumb.  She reports no known injury.  She has an MRI scheduled for 04/02/23.  She reports a pain-level around a 3-4/10 today at rest.  Pain increases with movement.  She hasn't found anything really decreases her pain. Hand dominance: Right.  PERTINENT HISTORY:  OP.  PAIN:  Are you having pain? Yes: NPRS scale: 3-4/10 Pain location: Left cervical into left UE to thumb. Pain description: Throbbing. Aggravating factors: As above. Relieving factors: As above.  PRECAUTIONS: Other: OP.  RED FLAGS: None     WEIGHT BEARING RESTRICTIONS: No  FALLS:  Has patient fallen in last 6 months? No  LIVING ENVIRONMENT: Lives with: lives alone Lives in: House/apartment Has following equipment at home: None  PLOF: Independent  PATIENT GOALS: Not have pain.    OBJECTIVE:  Note: Objective measures were completed at Evaluation unless otherwise noted.  DIAGNOSTIC FINDINGS:  02/12/23:   FINDINGS: The cervical spine is visualized from the skull base through the superior aspect of C7. There is no evidence of cervical spine fracture or prevertebral soft tissue swelling. 3 mm retrolisthesis of C4 on C5, which appears facet mediated. Disc height loss at C4-C5, C5-C6, and C6-C7, with disc osteophyte complexes. No other significant bone abnormalities are identified. The lung apices are clear.  IMPRESSION: 1. No acute fracture or traumatic listhesis of the cervical spine. 2. Multilevel degenerative changes of the cervical spine as described above.  PATIENT SURVEYS:  FOTO 50.49.  POSTURE: rounded shoulders and forward head  PALPATION: Some pain over left UT though her CC is pain over left biceps, medial elbow and thumb.  Scratch noted in area on left C5-6 region.   CERVICAL ROM:   Active right cervical rotation is 80 degrees, left 65 degrees and bilateral SBing is 20 degrees.   UPPER EXTREMITY MMT:  Normal UE strength, though left grip is 20# compared to right (dominant side) is 40#.    CERVICAL SPECIAL TESTS:  Normal UE DTR's.  TODAY'S TREATMENT:                                                                                                                              DATE: HMP and IFC at 80-150 Hz on 40% scan x 20 minutes to left UT region (away from scratched area).   Normal modality response following removal of modality.  PATIENT EDUCATION:   HOME EXERCISE PROGRAM:   ASSESSMENT:  CLINICAL IMPRESSION: The patient presents to OPPT with c/o left-sided neck pain and radiation into her biceps, left medial elbow region and thumb over the last couple of months.  She exhibits normal left UE strength and her DTR's are normal.  She lacks active cervical rotation to the left.  Her left grip is significantly weaker than the right side.  Her FOTO limitation score is a 50.49.  She is scheduled for an MRI this week.  Patient will benefit from skilled physical therapy  intervention to address pain and deficits.   OBJECTIVE IMPAIRMENTS: decreased activity tolerance, decreased ROM, increased muscle spasms, postural dysfunction, and pain.   ACTIVITY LIMITATIONS: carrying  PARTICIPATION LIMITATIONS: meal prep, cleaning, and laundry  PERSONAL FACTORS: 1 comorbidity: OP  are also affecting patient's functional outcome.   REHAB POTENTIAL: Good  CLINICAL DECISION MAKING: Evolving/moderate complexity  EVALUATION COMPLEXITY: Low   GOALS:  LONG TERM GOALS: Target date: 06/27/23  Ind with a HEP.  Goal status: INITIAL  2.  Active left cervical rotation to 75 degrees.  Goal status: INITIAL  3.  Eliminate left UE symptoms.  Goal status: INITIAL  4.  Perform ADL's with pain not > 3/10.  Goal status: INITIAL  PLAN:  PT FREQUENCY:  2-3 times a week.  PT DURATION: 4 weeks  PLANNED INTERVENTIONS: 97110-Therapeutic exercises, 97530- Therapeutic activity, [id number]- Neuromuscular re-education, 97535- Self Care, 13086- Manual therapy, 97014- Electrical stimulation (unattended), 97035- Ultrasound, Patient/Family education, Dry Needling, Cryotherapy, and Moist heat  PLAN FOR NEXT SESSION: Postural exercises, STW/M and modalities as needed.   Deshanta Lady, [place], PT 03/29/2023, 12:27 PM

## 2023-04-02 ENCOUNTER — Ambulatory Visit: Payer: Medicare Other

## 2023-04-02 DIAGNOSIS — M542 Cervicalgia: Secondary | ICD-10-CM | POA: Diagnosis not present

## 2023-04-02 DIAGNOSIS — M5412 Radiculopathy, cervical region: Secondary | ICD-10-CM | POA: Diagnosis not present

## 2023-04-02 DIAGNOSIS — M62838 Other muscle spasm: Secondary | ICD-10-CM

## 2023-04-02 NOTE — Therapy (Signed)
OUTPATIENT PHYSICAL THERAPY CERVICAL TREATMENT    Patient Name: Hannah [name]. Flecker MRN: [id number] DOB:[birth year], 62 y.o., female Today's Date: 04/02/2023  END OF SESSION:  PT End of Session - 04/02/23 1016     Visit Number 2    Number of Visits 12    Date for PT Re-Evaluation 06/27/23    Authorization Type FOTO.    PT Start Time 1015    PT Stop Time 1106    PT Time Calculation (min) 51 min    Activity Tolerance Patient tolerated treatment well    Behavior During Therapy WFL for tasks assessed/performed             Past Medical History:  Diagnosis Date   Acquired dilation of bile duct    Alcohol abuse    Anemia 06/27/2000   Anxiety    Arthritis 05/14/1998   Blood type O+    Chronic neck and back pain    Compulsive skin picking 1998   Depression    Drug abuse (HCC)    GERD (gastroesophageal reflux disease)    History of iron deficiency anemia    Hyperlipidemia    Insomnia    Migraines    Osteoporosis 07/08/2017   T-score -2.6 in 2019; -2.2 on 08/05/2018   Vitamin D deficiency    Past Surgical History:  Procedure Laterality Date   ABDOMINAL HYSTERECTOMY  09/07/2006   CESAREAN SECTION  02/16/1990   CHOLECYSTECTOMY  2001   ENDOMETRIAL ABLATION W/ NOVASURE     GASTRIC BYPASS  1998   HEMORRHOID SURGERY  2003   HERNIA REPAIR  2004   JOINT REPLACEMENT  08/21/2016   KNEE ARTHROSCOPY Right 2008   MOUTH SURGERY     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   Patient Active Problem List   Diagnosis Date Noted   Bulimia nervosa status post Roux-en-Y bypass 03/09/2023   Tick bite of right lower leg 01/26/2023   Cervical radiculopathy 10/12/2022   Skin-picking disorder 10/12/2022   Class 1 obesity due to excess calories with serious comorbidity and body mass index (BMI) of 32.0 to 32.9 in adult 04/16/2022   Gastroesophageal reflux disease without esophagitis 10/09/2021   Iron deficiency anemia 04/16/2021   Pure hypercholesterolemia 01/15/2021   Chronic neck pain  01/15/2021   Constipation 11/06/2020   Unilateral primary osteoarthritis, left knee 10/23/2020   Primary insomnia with snoring and restless legs 07/15/2020   Controlled substance agreement signed 07/15/2020   Major depressive disorder, recurrent episode, moderate (HCC) 07/15/2020   Generalized edema 07/15/2020   Morbid obesity (HCC) 01/15/2020   Chronic bilateral low back pain without sciatica 01/15/2020   Generalized anxiety disorder     REFERRING PROVIDER: Ceasar Lund MD  REFERRING DIAG: Cervicalgia.  THERAPY DIAG:  Cervicalgia  Other muscle spasm  Rationale for Evaluation and Treatment: Rehabilitation  ONSET DATE: ~2 months.  SUBJECTIVE:  SUBJECTIVE STATEMENT: Pt reports 5/10 cervical pain today.  Hand dominance: Right.  PERTINENT HISTORY:  OP.  PAIN:  Are you having pain? Yes: NPRS scale: 5/10 Pain location: Left cervical into left UE to thumb. Pain description: Throbbing. Aggravating factors: As above. Relieving factors: As above.  PRECAUTIONS: Other: OP.  RED FLAGS: None     WEIGHT BEARING RESTRICTIONS: No  FALLS:  Has patient fallen in last 6 months? No  LIVING ENVIRONMENT: Lives with: lives alone Lives in: House/apartment Has following equipment at home: None  PLOF: Independent  PATIENT GOALS: Not have pain.    OBJECTIVE:  Note: Objective measures were completed at Evaluation unless otherwise noted.  DIAGNOSTIC FINDINGS:  02/12/23:  FINDINGS: The cervical spine is visualized from the skull base through the superior aspect of C7. There is no evidence of cervical spine fracture or prevertebral soft tissue swelling. 3 mm retrolisthesis of C4 on C5, which appears facet mediated. Disc height loss at C4-C5, C5-C6, and C6-C7, with disc osteophyte  complexes. No other significant bone abnormalities are identified. The lung apices are clear.  IMPRESSION: 1. No acute fracture or traumatic listhesis of the cervical spine. 2. Multilevel degenerative changes of the cervical spine as described above.  PATIENT SURVEYS:  FOTO 50.49.  POSTURE: rounded shoulders and forward head  PALPATION: Some pain over left UT though her CC is pain over left biceps, medial elbow and thumb.  Scratch noted in area on left C5-6 region.   CERVICAL ROM:   Active right cervical rotation is 80 degrees, left 65 degrees and bilateral SBing is 20 degrees.   UPPER EXTREMITY MMT:  Normal UE strength, though left grip is 20# compared to right (dominant side) is 40#.    CERVICAL SPECIAL TESTS:  Normal UE DTR's.  TODAY'S TREATMENT:                                                                                                                              DATE:                                     EXERCISE LOG  Exercise Repetitions and Resistance Comments  UBE 6 mins 120 RPM (3 mins forward/backward)   Shoulder Shrugs 20 reps   Scap Retraction 20 reps    Cervical Retraction 20 reps   Rows Yellow x 20 reps   Extension Yellow x 20 reps    Blank cell = exercise not performed today   Manual Therapy Soft Tissue Mobilization: Cervical , STW/M to bil cervical paraspinals to decrease pain and tone, concentration on right side    Modalities  Date:  Unattended Estim: Cervical, IFC 80-150 Hz, 15 mins, Pain Hot Pack: Cervical, 15 mins, Pain and Tone    PATIENT EDUCATION:   HOME EXERCISE PROGRAM:   ASSESSMENT:  CLINICAL IMPRESSION: Pt arrives for today's treatment session reporting 5/10  neck pain.  Pt able to tolerate UBE for warm-up today with minimal discomfort.  Pt instructed in several seated postural exercises today to decrease pain and increase strength and function.  Pt requiring min cues for proper technique, pacing, and posture with all newly  added exercises.  STW/M performed to bil upper traps and cervical paraspinals to decrease pain and tone with concentration on right side due to increased tone.  Normal responses to estim and MH noted upon removal.  Pt reported decreased pain at completion of today's treatment session.   OBJECTIVE IMPAIRMENTS: decreased activity tolerance, decreased ROM, increased muscle spasms, postural dysfunction, and pain.   ACTIVITY LIMITATIONS: carrying  PARTICIPATION LIMITATIONS: meal prep, cleaning, and laundry  PERSONAL FACTORS: 1 comorbidity: OP  are also affecting patient's functional outcome.   REHAB POTENTIAL: Good  CLINICAL DECISION MAKING: Evolving/moderate complexity  EVALUATION COMPLEXITY: Low   GOALS:  LONG TERM GOALS: Target date: 06/27/23  Ind with a HEP.  Goal status: INITIAL  2.  Active left cervical rotation to 75 degrees.  Goal status: INITIAL  3.  Eliminate left UE symptoms.  Goal status: INITIAL  4.  Perform ADL's with pain not > 3/10.  Goal status: INITIAL  PLAN:  PT FREQUENCY:  2-3 times a week.  PT DURATION: 4 weeks  PLANNED INTERVENTIONS: 97110-Therapeutic exercises, 97530- Therapeutic activity, [id number]- Neuromuscular re-education, 97535- Self Care, 96295- Manual therapy, 97014- Electrical stimulation (unattended), 97035- Ultrasound, Patient/Family education, Dry Needling, Cryotherapy, and Moist heat  PLAN FOR NEXT SESSION: Postural exercises, STW/M and modalities as needed.   Newman Pies, PTA 04/02/2023, 11:12 AM

## 2023-04-06 ENCOUNTER — Encounter: Payer: Self-pay | Admitting: *Deleted

## 2023-04-06 ENCOUNTER — Encounter (HOSPITAL_COMMUNITY): Payer: Self-pay | Admitting: Psychiatry

## 2023-04-06 ENCOUNTER — Ambulatory Visit: Payer: Medicare Other | Admitting: *Deleted

## 2023-04-06 ENCOUNTER — Telehealth (INDEPENDENT_AMBULATORY_CARE_PROVIDER_SITE_OTHER): Payer: Medicare Other | Admitting: Psychiatry

## 2023-04-06 DIAGNOSIS — D508 Other iron deficiency anemias: Secondary | ICD-10-CM

## 2023-04-06 DIAGNOSIS — F411 Generalized anxiety disorder: Secondary | ICD-10-CM

## 2023-04-06 DIAGNOSIS — M542 Cervicalgia: Secondary | ICD-10-CM

## 2023-04-06 DIAGNOSIS — F424 Excoriation (skin-picking) disorder: Secondary | ICD-10-CM | POA: Diagnosis not present

## 2023-04-06 DIAGNOSIS — F5025 Bulimia nervosa, in remission: Secondary | ICD-10-CM

## 2023-04-06 DIAGNOSIS — F331 Major depressive disorder, recurrent, moderate: Secondary | ICD-10-CM | POA: Diagnosis not present

## 2023-04-06 DIAGNOSIS — F5104 Psychophysiologic insomnia: Secondary | ICD-10-CM | POA: Diagnosis not present

## 2023-04-06 DIAGNOSIS — F502 Bulimia nervosa, unspecified: Secondary | ICD-10-CM | POA: Diagnosis not present

## 2023-04-06 DIAGNOSIS — F5101 Primary insomnia: Secondary | ICD-10-CM

## 2023-04-06 DIAGNOSIS — M62838 Other muscle spasm: Secondary | ICD-10-CM | POA: Diagnosis not present

## 2023-04-06 DIAGNOSIS — Z789 Other specified health status: Secondary | ICD-10-CM | POA: Insufficient documentation

## 2023-04-06 NOTE — Patient Instructions (Signed)
We increased the NAC to 2400 mg once daily today.  Keep up the good work with cutting back on caffeine with a goal of having 8 to 12 ounces of coffee first thing in the morning.  If you need to see me again before you move this could the office call I will be happy to see you.  Otherwise start calling today to find a new psychiatrist where you were moving to.
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
## 2023-04-06 NOTE — Therapy (Addendum)
OUTPATIENT PHYSICAL THERAPY CERVICAL TREATMENT    Patient Name: Hannah [name]. Dorantes MRN: [id number] DOB:03/10/1961, 62 y.o., female Today's Date: 04/06/2023  END OF SESSION:  PT End of Session - 04/06/23 0802     Visit Number 3    Number of Visits 12    Date for PT Re-Evaluation 06/27/23    Authorization Type FOTO.    PT Start Time 0800    PT Stop Time 0851    PT Time Calculation (min) 51 min             Past Medical History:  Diagnosis Date   Acquired dilation of bile duct    Alcohol abuse    Anemia 06/27/2000   Anxiety    Arthritis 05/14/1998   Blood type O+    Chronic neck and back pain    Compulsive skin picking 1998   Depression    Drug abuse (HCC)    GERD (gastroesophageal reflux disease)    History of iron deficiency anemia    Hyperlipidemia    Insomnia    Migraines    Osteoporosis 07/08/2017   T-score -2.6 in 2019; -2.2 on 08/05/2018   Vitamin D deficiency    Past Surgical History:  Procedure Laterality Date   ABDOMINAL HYSTERECTOMY  09/07/2006   CESAREAN SECTION  02/16/1990   CHOLECYSTECTOMY  2001   ENDOMETRIAL ABLATION W/ NOVASURE     GASTRIC BYPASS  1998   HEMORRHOID SURGERY  2003   HERNIA REPAIR  2004   JOINT REPLACEMENT  08/21/2016   KNEE ARTHROSCOPY Right 2008   MOUTH SURGERY     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 03/09/2023   Tick bite of right lower leg 01/26/2023   Cervical radiculopathy 10/12/2022   Skin-picking disorder 10/12/2022   Class 1 obesity due to excess calories with serious comorbidity and body mass index (BMI) of 32.0 to 32.9 in adult 04/16/2022   Gastroesophageal reflux disease without esophagitis 10/09/2021   Iron deficiency anemia 04/16/2021   Pure hypercholesterolemia 01/15/2021   Chronic neck pain 01/15/2021   Constipation 11/06/2020   Unilateral primary  osteoarthritis, left knee 10/23/2020   Primary insomnia with snoring and restless legs 07/15/2020   Controlled substance agreement signed 07/15/2020   Major depressive disorder, recurrent episode, moderate (HCC) 07/15/2020   Generalized edema 07/15/2020   Morbid obesity (HCC) 01/15/2020   Chronic bilateral low back pain without sciatica 01/15/2020   Generalized anxiety disorder     REFERRING PROVIDER: Ceasar Lund MD  REFERRING DIAG: Cervicalgia.  THERAPY DIAG:  Cervicalgia  Other muscle spasm  Rationale for Evaluation and Treatment: Rehabilitation  ONSET DATE: ~2 months.  SUBJECTIVE:  SUBJECTIVE STATEMENT: Pt reports 4/10 cervical pain today.  Burning LT UT Hand dominance: Right.  PERTINENT HISTORY:  OP.  PAIN:  Are you having pain? Yes: NPRS scale:  /10 Pain location: Left cervical into left UE to thumb. Pain description: Throbbing. Aggravating factors: As above. Relieving factors: As above.  PRECAUTIONS: Other: OP.  RED FLAGS: None     WEIGHT BEARING RESTRICTIONS: No  FALLS:  Has patient fallen in last 6 months? No  LIVING ENVIRONMENT: Lives with: lives alone Lives in: House/apartment Has following equipment at home: None  PLOF: Independent  PATIENT GOALS: Not have pain.    OBJECTIVE:  Note: Objective measures were completed at Evaluation unless otherwise noted.  DIAGNOSTIC FINDINGS:  02/12/23:  FINDINGS: The cervical spine is visualized from the skull base through the superior aspect of C7. There is no evidence of cervical spine fracture or prevertebral soft tissue swelling. 3 mm retrolisthesis of C4 on C5, which appears facet mediated. Disc height loss at C4-C5, C5-C6, and C6-C7, with disc osteophyte complexes. No other significant bone  abnormalities are identified. The lung apices are clear.  IMPRESSION: 1. No acute fracture or traumatic listhesis of the cervical spine. 2. Multilevel degenerative changes of the cervical spine as described above.  PATIENT SURVEYS:  FOTO 50.49.  POSTURE: rounded shoulders and forward head  PALPATION: Some pain over left UT though her CC is pain over left biceps, medial elbow and thumb.  Scratch noted in area on left C5-6 region.   CERVICAL ROM:   Active right cervical rotation is 80 degrees, left 65 degrees and bilateral SBing is 20 degrees.   UPPER EXTREMITY MMT:  Normal UE strength, though left grip is 20# compared to right (dominant side) is 40#.    CERVICAL SPECIAL TESTS:  Normal UE DTR's.  TODAY'S TREATMENT:                                                                                                                              DATE:          04/05/22                                    EXERCISE LOG  Exercise Repetitions and Resistance Comments  UBE 6 mins 120 RPM (3 mins forward/backward)   Shoulder Shrugs    Scap Retraction    Cervical Retraction    Rows    Extension     Blank cell = exercise not performed today   Manual Therapy Soft Tissue Mobilization: Cervical , STW/TPR to bil cervical paraspinals and Utraps to decrease pain and tone as well as Tp's seated position    Modalities  Date:  Unattended Estim: Cervical, IFC 80-150 Hz, 15 mins, Pain Hot Pack: Cervical, 15 mins, Pain and Tone    PATIENT EDUCATION:   HOME EXERCISE PROGRAM:   ASSESSMENT:  CLINICAL IMPRESSION: Pt  arrived today with bil. Neck and Utrap pain with LT side burning sensation. Pt was able to perform UBE, but experienced increased pain. Manual STW and TP release was performed to Bil. Cervical paras with RT side worse than LT. Also performed to Bil. Utraps with LT side worse than RT . Pt reports feeling better after Rx.    OBJECTIVE IMPAIRMENTS: decreased activity tolerance,  decreased ROM, increased muscle spasms, postural dysfunction, and pain.   ACTIVITY LIMITATIONS: carrying  PARTICIPATION LIMITATIONS: meal prep, cleaning, and laundry  PERSONAL FACTORS: 1 comorbidity: OP  are also affecting patient's functional outcome.   REHAB POTENTIAL: Good  CLINICAL DECISION MAKING: Evolving/moderate complexity  EVALUATION COMPLEXITY: Low   GOALS:  LONG TERM GOALS: Target date: 06/27/23  Ind with a HEP.  Goal status: INITIAL  2.  Active left cervical rotation to 75 degrees.  Goal status: INITIAL  3.  Eliminate left UE symptoms.  Goal status: INITIAL  4.  Perform ADL's with pain not > 3/10.  Goal status: INITIAL  PLAN:  PT FREQUENCY:  2-3 times a week.  PT DURATION: 4 weeks  PLANNED INTERVENTIONS: 97110-Therapeutic exercises, 97530- Therapeutic activity, [id number]- Neuromuscular re-education, 97535- Self Care, 81191- Manual therapy, 97014- Electrical stimulation (unattended), 97035- Ultrasound, Patient/Family education, Dry Needling, Cryotherapy, and Moist heat  PLAN FOR NEXT SESSION: Postural exercises, STW/M    Reford Olliff,CHRIS, PTA 04/06/2023, 11:56 AM

## 2023-04-07 ENCOUNTER — Ambulatory Visit: Payer: Medicare Other

## 2023-04-07 DIAGNOSIS — M5412 Radiculopathy, cervical region: Secondary | ICD-10-CM | POA: Diagnosis not present

## 2023-04-07 DIAGNOSIS — M542 Cervicalgia: Secondary | ICD-10-CM

## 2023-04-07 DIAGNOSIS — M62838 Other muscle spasm: Secondary | ICD-10-CM | POA: Diagnosis not present

## 2023-04-07 DIAGNOSIS — M4712 Other spondylosis with myelopathy, cervical region: Secondary | ICD-10-CM | POA: Diagnosis not present

## 2023-04-07 NOTE — Therapy (Signed)
OUTPATIENT PHYSICAL THERAPY CERVICAL TREATMENT    Patient Name: Hannah [name]. Fabila MRN: [id number] DOB:March 24, 1961, 62 y.o., female Today's Date: 04/07/2023  END OF SESSION:  PT End of Session - 04/07/23 1518     Visit Number 4    Number of Visits 12    Date for PT Re-Evaluation 06/27/23    Authorization Type FOTO.    PT Start Time 1515    PT Stop Time 1600    PT Time Calculation (min) 45 min    Activity Tolerance Patient tolerated treatment well    Behavior During Therapy WFL for tasks assessed/performed             Past Medical History:  Diagnosis Date   Acquired dilation of bile duct    Alcohol abuse    Anemia 06/27/2000   Anxiety    Arthritis 05/14/1998   Blood type O+    Chronic neck and back pain    Compulsive skin picking 1998   Depression    Drug abuse (HCC)    GERD (gastroesophageal reflux disease)    History of iron deficiency anemia    Hyperlipidemia    Insomnia    Migraines    Osteoporosis 07/08/2017   T-score -2.6 in 2019; -2.2 on 08/05/2018   Vitamin D deficiency    Past Surgical History:  Procedure Laterality Date   ABDOMINAL HYSTERECTOMY  09/07/2006   CESAREAN SECTION  02/16/1990   CHOLECYSTECTOMY  2001   ENDOMETRIAL ABLATION W/ NOVASURE     GASTRIC BYPASS  1998   HEMORRHOID SURGERY  2003   HERNIA REPAIR  2004   JOINT REPLACEMENT  08/21/2016   KNEE ARTHROSCOPY Right 2008   MOUTH SURGERY     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 03/09/2023   Tick bite of right lower leg 01/26/2023   Cervical radiculopathy 10/12/2022   Skin-picking disorder 10/12/2022   Class 1 obesity due to excess calories with serious comorbidity and body mass index (BMI) of 32.0 to 32.9 in adult 04/16/2022   Gastroesophageal reflux disease without esophagitis 10/09/2021   Iron deficiency anemia 04/16/2021   Pure  hypercholesterolemia 01/15/2021   Chronic neck pain 01/15/2021   Constipation 11/06/2020   Unilateral primary osteoarthritis, left knee 10/23/2020   Primary insomnia with snoring and restless legs 07/15/2020   Controlled substance agreement signed 07/15/2020   Major depressive disorder, recurrent episode, moderate (HCC) 07/15/2020   Generalized edema 07/15/2020   Morbid obesity (HCC) 01/15/2020   Chronic bilateral low back pain without sciatica 01/15/2020   Generalized anxiety disorder     REFERRING PROVIDER: Ceasar Lund, MD  REFERRING DIAG: Cervicalgia.  THERAPY DIAG:  Cervicalgia  Other muscle spasm  Rationale for Evaluation and Treatment: Rehabilitation  ONSET DATE: ~2 months.  SUBJECTIVE:  SUBJECTIVE STATEMENT: Patient reports that she feels about the same as she did prior to therapy. She notes that she felt a lot better after getting her trigger points in her left upper trapezius released. She notes that she felt good for about 3 days after her last appointment. She reports that she now only gets a headache once a week or every other week. She feels that she is about 50% better than she was prior to therapy.   Hand dominance: Right.  PERTINENT HISTORY:  OP.  PAIN:  Are you having pain? Yes: NPRS scale: 3/10 Pain location: Left cervical into left UE to thumb. Pain description: Throbbing. Aggravating factors: As above. Relieving factors: As above.  PRECAUTIONS: Other: OP.  RED FLAGS: None     WEIGHT BEARING RESTRICTIONS: No  FALLS:  Has patient fallen in last 6 months? No  LIVING ENVIRONMENT: Lives with: lives alone Lives in: House/apartment Has following equipment at home: None  PLOF: Independent  PATIENT GOALS: Not have pain.  NEXT MD FOLLOW  UP:  04/09/23  OBJECTIVE:  Note: Objective measures were completed at Evaluation unless otherwise noted.  DIAGNOSTIC FINDINGS:  02/12/23:  FINDINGS: The cervical spine is visualized from the skull base through the superior aspect of C7. There is no evidence of cervical spine fracture or prevertebral soft tissue swelling. 3 mm retrolisthesis of C4 on C5, which appears facet mediated. Disc height loss at C4-C5, C5-C6, and C6-C7, with disc osteophyte complexes. No other significant bone abnormalities are identified. The lung apices are clear.  IMPRESSION: 1. No acute fracture or traumatic listhesis of the cervical spine. 2. Multilevel degenerative changes of the cervical spine as described above.  PATIENT SURVEYS:  FOTO 50.49 FOTO: 61.20 on 04/07/23  POSTURE: rounded shoulders and forward head  PALPATION: Some pain over left UT though her CC is pain over left biceps, medial elbow and thumb.  Scratch noted in area on left C5-6 region.   CERVICAL ROM:   Active right cervical rotation is 80 degrees, left 65 degrees and bilateral SBing is 20 degrees.  04/07/23:  Cervical rotation:   Right: 76 degrees; slight pain   Left: 74 degrees; more pain than going to the right Side bending:   Right: 46 degrees; "pulling"  Left: 42 degrees; uncomfortable  UPPER EXTREMITY MMT:  Normal UE strength, though left grip is 20# compared to right (dominant side) is 40#.    04/07/23  Right grip strength: 50#   Left grip strength: 35#  CERVICAL SPECIAL TESTS:  Normal UE DTR's.  TODAY'S TREATMENT:                                                                                                                              DATE:  04/07/23 EXERCISE LOG  Exercise Repetitions and Resistance Comments  UBE 8 minutes @ 90 RPM    Scapular retraction 20 reps    L upper trapezius stretch  3 x 30 seconds    Towel squeeze  Modified to putty squeeze   Putty  squeeze  Yellow x 2 minutes   Chin tuck  20 reps  Seated   Blank cell = exercise not performed today  Manual Therapy Soft Tissue Mobilization: left upper trapezius and thoracic paraspinals, for reduced pain and tone    PATIENT EDUCATION:  MRI results, HEP (handout provided), and progress with therapy  Patient reported understanding. HOME EXERCISE PROGRAM: [id number]  ASSESSMENT:  CLINICAL IMPRESSION: Patient is making good progress with skilled physical therapy as evidenced by her subjective reports, objective measures, functional mobility, and progress toward her goals. She was able to demonstrate a significant improvement in her left cervical rotation and her left hand grip strength since her initial evaluation on 03/29/23. However, she continues to experience elevated pain levels and radiating symptoms down her left upper extremity. She was introduced to multiple new interventions for reduced pain and improved cervical musculature engagement. She required minimal visual and tactile cueing with scapular retractions to facilitate periscapular engagement while limiting scapular elevation. She was provided a HEP which included today's interventions. She reported feeling comfortable performing these interventions at home. She experienced no increase in pain or discomfort with any of today's interventions. She reported feeling better upon the conclusion of treatment. Recommend that she continue with with her current plan of care to address her impairments to return to her prior level of function.   OBJECTIVE IMPAIRMENTS: decreased activity tolerance, decreased ROM, increased muscle spasms, postural dysfunction, and pain.   ACTIVITY LIMITATIONS: carrying  PARTICIPATION LIMITATIONS: meal prep, cleaning, and laundry  PERSONAL FACTORS: 1 comorbidity: OP  are also affecting patient's functional outcome.   REHAB POTENTIAL: Good  CLINICAL DECISION MAKING: Evolving/moderate complexity  EVALUATION  COMPLEXITY: Low   GOALS:  LONG TERM GOALS: Target date: 06/27/23  Ind with a HEP.  Baseline: patient provided a HEP on 04/07/23 Goal status: ON GOING  2.  Active left cervical rotation to 75 degrees.   Baseline: see objective Goal status: NEARLY MET  3.  Eliminate left UE symptoms.   Baseline: constant ache Goal status: ON GOING  4.  Perform ADL's with pain not > 3/10.   Baseline: 3/10, but has got up to 5/10  Goal status: ON GOING  PLAN:  PT FREQUENCY:  2-3 times a week.  PT DURATION: 4 weeks  PLANNED INTERVENTIONS: 97110-Therapeutic exercises, 97530- Therapeutic activity, [id number]- Neuromuscular re-education, 97535- Self Care, 54098- Manual therapy, 97014- Electrical stimulation (unattended), 97035- Ultrasound, Patient/Family education, Dry Needling, Cryotherapy, and Moist heat  PLAN FOR NEXT SESSION: review and update HEP as needed, postural reeducation, manual therapy, and modalities as needed   Granville Lewis, PT 04/07/2023, 4:15 PM

## 2023-04-08 ENCOUNTER — Encounter: Payer: Self-pay | Admitting: Family Medicine

## 2023-04-08 ENCOUNTER — Ambulatory Visit (HOSPITAL_COMMUNITY)
Admission: RE | Admit: 2023-04-08 | Discharge: 2023-04-08 | Disposition: A | Discharge status: Home / Self Care | Payer: Medicare Other | Source: Ambulatory Visit | Attending: Family Medicine | Admitting: Family Medicine

## 2023-04-08 ENCOUNTER — Ambulatory Visit: Payer: Medicare Other | Admitting: Family Medicine

## 2023-04-08 VITALS — BP 108/59 | HR 60 | Temp 98.1°F | Ht 63.0 in | Wt 208.0 lb

## 2023-04-08 DIAGNOSIS — R278 Other lack of coordination: Secondary | ICD-10-CM

## 2023-04-08 DIAGNOSIS — R269 Unspecified abnormalities of gait and mobility: Secondary | ICD-10-CM

## 2023-04-08 DIAGNOSIS — R29810 Facial weakness: Secondary | ICD-10-CM | POA: Insufficient documentation

## 2023-04-08 DIAGNOSIS — R29898 Other symptoms and signs involving the musculoskeletal system: Secondary | ICD-10-CM | POA: Insufficient documentation

## 2023-04-08 DIAGNOSIS — R2681 Unsteadiness on feet: Secondary | ICD-10-CM | POA: Diagnosis not present

## 2023-04-08 NOTE — Progress Notes (Signed)
Acute Office Visit  Subjective:     Patient ID: Hannah [name]. Hannah [name], female    DOB: 1961-02-26, 62 y.o.   MRN: [id number]  Chief Complaint  Patient presents with   facial drooping    HPI Patient is in today for left sided facial dropping that started on 03/29/23. Has been unchanged. Denies changes in vision, gait, trouble swallowing, confusion, changes in speech, new weakness. She is still having tingling and weakness in her LUE which has been ongoing for weeks, unchanged. Has felt off balance.  Recently had a MRI with neurosurgery of her C spine for this. Report as follows:  "MRI with probably loss of cord volume consistent with myelomalacia with moderate severe right and and moderate to severe left foraminal stenosis. C5-6 moderate left and moderate to severe right foraminal stenosis C6-C7 moderate to moderate severe biforaminal stenosis"  Has follow up with neurosurgereon tomorrow to discuss surgery.   ROS As per HPI.      Objective:    BP (!) 108/59   Pulse 60   Temp 98.1 F (36.7 C) (Temporal)   Ht 5\' 3"  (1.6 m)   Wt 208 lb (94.3 kg)   SpO2 100%   BMI 36.85 kg/m    Physical Exam Vitals and nursing note reviewed.  Constitutional:      General: She is not in acute distress.    Appearance: She is obese. She is not ill-appearing, toxic-appearing or diaphoretic.  HENT:     Head: Normocephalic and atraumatic.     Mouth/Throat:     Mouth: Mucous membranes are moist.     Pharynx: Oropharynx is clear. No oropharyngeal exudate or posterior oropharyngeal erythema.  Eyes:     Extraocular Movements: Extraocular movements intact.     Pupils: Pupils are equal, round, and reactive to light.  Cardiovascular:     Rate and Rhythm: Normal rate and regular rhythm.     Heart sounds: Normal heart sounds. No murmur heard. Pulmonary:     Effort: Pulmonary effort is normal.     Breath sounds: Normal breath sounds.  Musculoskeletal:     Cervical back: Neck supple. No rigidity.   Skin:    General: Skin is warm and dry.  Neurological:     Mental Status: She is alert and oriented to person, place, and time.     Cranial Nerves: Facial asymmetry (minimal left sided facial drooping) present.     Sensory: Sensory deficit (LUE) present.     Motor: Weakness (LUE) present. No tremor or atrophy.     Coordination: Romberg sign positive. Finger-Nose-Finger Test abnormal.     Gait: Tandem walk abnormal.  Psychiatric:        Mood and Affect: Mood normal.        Behavior: Behavior normal.     No results found for any visits on 04/08/23.      Assessment & Plan:   Telicia was seen today for facial drooping.  Diagnoses and all orders for this visit:  Facial droop  LUE weakness  Abnormal coordination  Abnormal tandem walk   Concern for CVA. Stat CT ordered to evaluate for this. Will notify patient of results and plan of care pending results.   The patient indicates understanding of these issues and agrees with the plan.   Gabriel Earing, FNP

## 2023-04-12 DIAGNOSIS — M4712 Other spondylosis with myelopathy, cervical region: Secondary | ICD-10-CM | POA: Diagnosis not present

## 2023-04-12 DIAGNOSIS — Z6838 Body mass index (BMI) 38.0-38.9, adult: Secondary | ICD-10-CM | POA: Diagnosis not present

## 2023-04-13 ENCOUNTER — Encounter: Payer: Self-pay | Admitting: *Deleted

## 2023-04-13 ENCOUNTER — Ambulatory Visit: Payer: Medicare Other | Attending: Anesthesiology | Admitting: *Deleted

## 2023-04-13 DIAGNOSIS — M62838 Other muscle spasm: Secondary | ICD-10-CM | POA: Diagnosis not present

## 2023-04-13 DIAGNOSIS — M542 Cervicalgia: Secondary | ICD-10-CM | POA: Diagnosis not present

## 2023-04-13 NOTE — Therapy (Addendum)
OUTPATIENT PHYSICAL THERAPY CERVICAL TREATMENT    Patient Name: Aitana Linsenbigler. Enser MRN: [id number] DOB:02-25-1961, 62 y.o., female Today's Date: 04/13/2023  END OF SESSION:  PT End of Session - 04/13/23 0801     Visit Number 5    Number of Visits 12    Date for PT Re-Evaluation 06/27/23    Authorization Type FOTO.    PT Start Time 0800    PT Stop Time 0850    PT Time Calculation (min) 50 min             Past Medical History:  Diagnosis Date   Acquired dilation of bile duct    Alcohol abuse    Anemia 06/27/2000   Anxiety    Arthritis 05/14/1998   Blood type O+    Chronic neck and back pain    Compulsive skin picking 1998   Depression    Drug abuse (HCC)    GERD (gastroesophageal reflux disease)    History of iron deficiency anemia    Hyperlipidemia    Insomnia    Migraines    Osteoporosis 07/08/2017   T-score -2.6 in 2019; -2.2 on 08/05/2018   Vitamin D deficiency    Past Surgical History:  Procedure Laterality Date   ABDOMINAL HYSTERECTOMY  09/07/2006   CESAREAN SECTION  02/16/1990   CHOLECYSTECTOMY  2001   ENDOMETRIAL ABLATION W/ NOVASURE     GASTRIC BYPASS  1998   HEMORRHOID SURGERY  2003   HERNIA REPAIR  2004   JOINT REPLACEMENT  08/21/2016   KNEE ARTHROSCOPY Right 2008   MOUTH SURGERY     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 03/09/2023   Tick bite of right lower leg 01/26/2023   Cervical radiculopathy 10/12/2022   Skin-picking disorder 10/12/2022   Class 1 obesity due to excess calories with serious comorbidity and body mass index (BMI) of 32.0 to 32.9 in adult 04/16/2022   Gastroesophageal reflux disease without esophagitis 10/09/2021   Iron deficiency anemia 04/16/2021   Pure hypercholesterolemia 01/15/2021   Chronic neck pain 01/15/2021   Constipation 11/06/2020   Unilateral primary  osteoarthritis, left knee 10/23/2020   Primary insomnia with snoring and restless legs 07/15/2020   Controlled substance agreement signed 07/15/2020   Major depressive disorder, recurrent episode, moderate (HCC) 07/15/2020   Generalized edema 07/15/2020   Morbid obesity (HCC) 01/15/2020   Chronic bilateral low back pain without sciatica 01/15/2020   Generalized anxiety disorder     REFERRING PROVIDER: Ceasar Lund, MD  REFERRING DIAG: Cervicalgia.  THERAPY DIAG:  Cervicalgia  Other muscle spasm  Rationale for Evaluation and Treatment: Rehabilitation  ONSET DATE: ~2 months.  SUBJECTIVE:  SUBJECTIVE STATEMENT: Patient reports that she will DC today after Rx due to needing to have ACDF soon. Hand dominance: Right.  PERTINENT HISTORY:  OP.  PAIN:  Are you having pain? Yes: NPRS scale: 3/10 Pain location: Left cervical into left UE to thumb. Pain description: Throbbing. Aggravating factors: As above. Relieving factors: As above.  PRECAUTIONS: Other: OP.  RED FLAGS: None     WEIGHT BEARING RESTRICTIONS: No  FALLS:  Has patient fallen in last 6 months? No  LIVING ENVIRONMENT: Lives with: lives alone Lives in: House/apartment Has following equipment at home: None  PLOF: Independent  PATIENT GOALS: Not have pain.  NEXT MD FOLLOW UP:  04/09/23  OBJECTIVE:  Note: Objective measures were completed at Evaluation unless otherwise noted.  DIAGNOSTIC FINDINGS:  02/12/23:  FINDINGS: The cervical spine is visualized from the skull base through the superior aspect of C7. There is no evidence of cervical spine fracture or prevertebral soft tissue swelling. 3 mm retrolisthesis of C4 on C5, which appears facet mediated. Disc height loss at C4-C5, C5-C6, and C6-C7, with disc  osteophyte complexes. No other significant bone abnormalities are identified. The lung apices are clear.  IMPRESSION: 1. No acute fracture or traumatic listhesis of the cervical spine. 2. Multilevel degenerative changes of the cervical spine as described above.  PATIENT SURVEYS:  FOTO 50.49 FOTO: 61.20 on 04/07/23  POSTURE: rounded shoulders and forward head  PALPATION: Some pain over left UT though her CC is pain over left biceps, medial elbow and thumb.  Scratch noted in area on left C5-6 region.   CERVICAL ROM:   Active right cervical rotation is 80 degrees, left 65 degrees and bilateral SBing is 20 degrees.  04/07/23:  Cervical rotation:   Right: 76 degrees; slight pain   Left: 74 degrees; more pain than going to the right Side bending:   Right: 46 degrees; "pulling"  Left: 42 degrees; uncomfortable  UPPER EXTREMITY MMT:  Normal UE strength, though left grip is 20# compared to right (dominant side) is 40#.    04/07/23  Right grip strength: 50#   Left grip strength: 35#  CERVICAL SPECIAL TESTS:  Normal UE DTR's.  TODAY'S TREATMENT:                                                                                                                              DATE:          04-13-23 [place] estim combo x 10 mins 1.5 w/cm2 to Bil Utraps STW/TPR to Bil Utraps and Levator scap IFC x 15 mins to cerv. Paras and Utraps                                   04/07/23 EXERCISE LOG  Exercise Repetitions and Resistance Comments  UBE 8 minutes @ 90 RPM    Scapular retraction 20 reps    L upper trapezius  stretch  3 x 30 seconds    Towel squeeze  Modified to putty squeeze   Putty squeeze  Yellow x 2 minutes   Chin tuck  20 reps  Seated   Blank cell = exercise not performed today  Manual Therapy Soft Tissue Mobilization: left upper trapezius and thoracic paraspinals, for reduced pain and tone    PATIENT EDUCATION:  MRI results, HEP (handout provided), and progress with therapy   Patient reported understanding. HOME EXERCISE PROGRAM: [id number]  ASSESSMENT:  CLINICAL IMPRESSION: Patient arrived today reporting that she has to have neck surgery and will need to DC today. Rx focused on pain reduction and STW to decrease mm tension / release Tp's. Pt did really well with treatment with decreased pain and mm tension. Unable to meet ROM and pain goals due to pain and ROM deficits.    OBJECTIVE IMPAIRMENTS: decreased activity tolerance, decreased ROM, increased muscle spasms, postural dysfunction, and pain.   ACTIVITY LIMITATIONS: carrying  PARTICIPATION LIMITATIONS: meal prep, cleaning, and laundry  PERSONAL FACTORS: 1 comorbidity: OP  are also affecting patient's functional outcome.   REHAB POTENTIAL: Good  CLINICAL DECISION MAKING: Evolving/moderate complexity  EVALUATION COMPLEXITY: Low   GOALS:  LONG TERM GOALS: Target date: 06/27/23  Ind with a HEP.  Baseline: patient provided a HEP on 04/07/23 Goal status: MET  2.  Active left cervical rotation to 75 degrees.   Baseline: see objective Goal status: NOT MET  68 degrees  3.  Eliminate left UE symptoms.   Baseline: constant ache Goal status: Not Met  4.  Perform ADL's with pain not > 3/10.   Baseline: 3/10, but has got up to 5/10  Goal status: Not Met  PLAN:  PT FREQUENCY:  2-3 times a week.  PT DURATION: 4 weeks  PLANNED INTERVENTIONS: 97110-Therapeutic exercises, 97530- Therapeutic activity, [id number]- Neuromuscular re-education, 97535- Self Care, 47829- Manual therapy, 97014- Electrical stimulation (unattended), 97035- Ultrasound, Patient/Family education, Dry Needling, Cryotherapy, and Moist heat  PLAN FOR NEXT SESSION:  DC due to surgery   Lockie Bothun,CHRIS, PTA 04/13/2023, 9:15 AM  PHYSICAL THERAPY DISCHARGE SUMMARY  Visits from Start of Care: 5.  Current functional level related to goals / functional outcomes: See above.   Remaining deficits: Continued pain.  Patient states she  will be needing surgery.   Education / Equipment: HEP.   Patient agrees to discharge. Patient goals were partially met. Patient is being discharged due to lack of progress.    Italy Applegate MPT

## 2023-04-15 ENCOUNTER — Ambulatory Visit: Payer: Medicare Other | Admitting: Family Medicine

## 2023-04-15 ENCOUNTER — Other Ambulatory Visit: Payer: Self-pay | Admitting: Neurosurgery

## 2023-04-15 ENCOUNTER — Encounter: Payer: Medicare Other | Admitting: *Deleted

## 2023-04-20 ENCOUNTER — Institutional Professional Consult (permissible substitution): Payer: Medicare Other | Admitting: Neurology

## 2023-04-22 ENCOUNTER — Other Ambulatory Visit: Payer: Self-pay | Admitting: Family Medicine

## 2023-04-22 ENCOUNTER — Encounter: Payer: Self-pay | Admitting: Family Medicine

## 2023-04-22 DIAGNOSIS — G8929 Other chronic pain: Secondary | ICD-10-CM

## 2023-04-27 ENCOUNTER — Other Ambulatory Visit: Payer: Self-pay | Admitting: Neurosurgery

## 2023-05-05 ENCOUNTER — Encounter: Payer: Self-pay | Admitting: Family Medicine

## 2023-05-05 ENCOUNTER — Ambulatory Visit: Payer: Medicare Other | Admitting: Family Medicine

## 2023-05-05 ENCOUNTER — Other Ambulatory Visit: Payer: Self-pay | Admitting: Medical Genetics

## 2023-05-05 VITALS — BP 106/66 | HR 69 | Temp 97.9°F | Ht 63.0 in | Wt 209.6 lb

## 2023-05-05 DIAGNOSIS — M542 Cervicalgia: Secondary | ICD-10-CM | POA: Diagnosis not present

## 2023-05-05 DIAGNOSIS — G8929 Other chronic pain: Secondary | ICD-10-CM | POA: Diagnosis not present

## 2023-05-05 DIAGNOSIS — J0100 Acute maxillary sinusitis, unspecified: Secondary | ICD-10-CM | POA: Diagnosis not present

## 2023-05-05 DIAGNOSIS — M545 Low back pain, unspecified: Secondary | ICD-10-CM | POA: Diagnosis not present

## 2023-05-05 DIAGNOSIS — Z79899 Other long term (current) drug therapy: Secondary | ICD-10-CM | POA: Diagnosis not present

## 2023-05-05 MED ORDER — AMOXICILLIN-POT CLAVULANATE 875-125 MG PO TABS: 1.0000 | ORAL_TABLET | Freq: Two times a day (BID) | ORAL | 0 refills | Status: AC

## 2023-05-05 NOTE — Progress Notes (Signed)
Acute Office Visit  Subjective:     Patient ID: Hannah [name]. Hannah [name], female    DOB: 04/17/1961, 62 y.o.   MRN: [id number]  Chief Complaint  Patient presents with   Nasal Congestion    NOSE RUNNY, SWOLLEN AND PRESSURE X 1 WEEK. LOSING BALANCE MORE.   GI Problem    Stomach makes a lot of noise after she eats. Never been to GI.    URI  This is a new problem. Episode onset: 1 week. The problem has been unchanged. There has been no fever. Associated symptoms include abdominal pain (after eating with gurgling), congestion, coughing, headaches, nausea, rhinorrhea and sinus pain. Pertinent negatives include no chest pain, diarrhea, ear pain, plugged ear sensation, sore throat, vomiting or wheezing. Treatments tried: antihistamine. The treatment provided no relief.   She has neck surgery planned for 05/18/23. She has a month fill of her opiates for December. She isn't sure if she will be able to come in for her chronic follow up in January for additional refills. She also isn't sure if her neurosurgeon will sent pain medication after surgery.    Review of Systems  HENT:  Positive for congestion, rhinorrhea and sinus pain. Negative for ear pain and sore throat.   Respiratory:  Positive for cough. Negative for wheezing.   Cardiovascular:  Negative for chest pain.  Gastrointestinal:  Positive for abdominal pain (after eating with gurgling) and nausea. Negative for diarrhea and vomiting.  Neurological:  Positive for headaches.        Objective:    BP 106/66   Pulse 69   Temp 97.9 F (36.6 C) (Temporal)   Ht 5\' 3"  (1.6 m)   Wt 209 lb 9.6 oz (95.1 kg)   SpO2 98%   BMI 37.13 kg/m    Physical Exam Vitals and nursing note reviewed.  Constitutional:      General: She is not in acute distress.    Appearance: She is not ill-appearing, toxic-appearing or diaphoretic.  HENT:     Right Ear: Tympanic membrane, ear canal and external ear normal.     Left Ear: Tympanic membrane, ear canal and  external ear normal.     Nose: Congestion present.     Right Sinus: Maxillary sinus tenderness present. No frontal sinus tenderness.     Left Sinus: Maxillary sinus tenderness present. No frontal sinus tenderness.     Mouth/Throat:     Mouth: Mucous membranes are moist.     Pharynx: Oropharynx is clear.  Eyes:     General:        Right eye: No discharge.        Left eye: No discharge.     Extraocular Movements: Extraocular movements intact.     Conjunctiva/sclera: Conjunctivae normal.  Cardiovascular:     Rate and Rhythm: Normal rate and regular rhythm.     Heart sounds: Normal heart sounds. No murmur heard. Pulmonary:     Effort: Pulmonary effort is normal. No respiratory distress.     Breath sounds: Normal breath sounds. No wheezing, rhonchi or rales.  Chest:     Chest wall: No tenderness.  Neurological:     Mental Status: She is alert.     No results found for any visits on 05/05/23.      Assessment & Plan:   Hannah [name] was seen today for nasal congestion and gi problem.  Diagnoses and all orders for this visit:  Acute non-recurrent maxillary sinusitis Discussed symptomatic care and return precautions.  -  amoxicillin-clavulanate (AUGMENTIN) 875-125 MG tablet; Take 1 tablet by mouth 2 (two) times daily for 7 days.  Chronic neck pain Chronic bilateral low back pain without sciatica Controlled substance agreement signed Discussed that neurosurgeon will likely prescribed medications post surgery. However, if not, she will call the office to notify me for her January refill. At that point, I will review the PDMP and send in refill without office visit given extenuating circumstances if no red flags on PDMP.    The patient indicates understanding of these issues and agrees with the plan.  Gabriel Earing, FNP

## 2023-05-11 NOTE — Pre-Procedure Instructions (Signed)
Surgical Instructions   Your procedure is scheduled on May 18, 2023. Report to [facility] Main Entrance "A" at 5:30 A.M., then check in with the Admitting office. Any questions or running late day of surgery: call [phone]  Questions prior to your surgery date: call [phone], Monday-Friday, 8am-4pm. If you experience any cold or flu symptoms such as cough, fever, chills, shortness of breath, etc. between now and your scheduled surgery, please notify us at the above number.     Remember:  Do not eat or drink after midnight the night before your surgery    Take these medicines the morning of surgery with A SIP OF WATER: busPIRone (BUSPAR)  famotidine (PEPCID)  pantoprazole (PROTONIX)  sertraline (ZOLOFT)    May take these medicines IF NEEDED: baclofen (LIORESAL)  naloxone (NARCAN) nasal spray  Oxycodone    One week prior to surgery, STOP taking any Aspirin (unless otherwise instructed by your surgeon) Aleve, Naproxen, Ibuprofen, Motrin, Advil, Goody's, BC's, all herbal medications, fish oil, and non-prescription vitamins. This includes your medication: celecoxib (CELEBREX)                      Do NOT Smoke (Tobacco/Vaping) for 24 hours prior to your procedure.  If you use a CPAP at night, you may bring your mask/headgear for your overnight stay.   You will be asked to remove any contacts, glasses, piercing's, hearing aid's, dentures/partials prior to surgery. Please bring cases for these items if needed.    Patients discharged the day of surgery will not be allowed to drive home, and someone needs to stay with them for 24 hours.  SURGICAL WAITING ROOM VISITATION Patients may have no more than 2 support people in the waiting area - these visitors may rotate.   Pre-op nurse will coordinate an appropriate time for 1 ADULT support person, who may not rotate, to accompany patient in pre-op.  Children under the age of 64 must have an adult with them who is not the  patient and must remain in the main waiting area with an adult.  If the patient needs to stay at the hospital during part of their recovery, the visitor guidelines for inpatient rooms apply.  Please refer to the [facility] website for the visitor guidelines for any additional information.   If you received a COVID test during your pre-op visit  it is requested that you wear a mask when out in public, stay away from anyone that may not be feeling well and notify your surgeon if you develop symptoms. If you have been in contact with anyone that has tested positive in the last 10 days please notify you surgeon.      Pre-operative 5 CHG Bathing Instructions   You can play a key role in reducing the risk of infection after surgery. Your skin needs to be as free of germs as possible. You can reduce the number of germs on your skin by washing with CHG (chlorhexidine gluconate) soap before surgery. CHG is an antiseptic soap that kills germs and continues to kill germs even after washing.   DO NOT use if you have an allergy to chlorhexidine/CHG or antibacterial soaps. If your skin becomes reddened or irritated, stop using the CHG and notify one of our RNs at [phone].   Please shower with the CHG soap starting 4 days before surgery using the following schedule:     Please keep in mind the following:  DO NOT shave, including legs and  underarms, starting the day of your first shower.   You may shave your face at any point before/day of surgery.  Place clean sheets on your bed the day you start using CHG soap. Use a clean washcloth (not used since being washed) for each shower. DO NOT sleep with pets once you start using the CHG.   CHG Shower Instructions:  Wash your face and private area with normal soap. If you choose to wash your hair, wash first with your normal shampoo.  After you use shampoo/soap, rinse your hair and body thoroughly to remove shampoo/soap residue.  Turn the water OFF  and apply about 3 tablespoons (45 ml) of CHG soap to a CLEAN washcloth.  Apply CHG soap ONLY FROM YOUR NECK DOWN TO YOUR TOES (washing for 3-5 minutes)  DO NOT use CHG soap on face, private areas, open wounds, or sores.  Pay special attention to the area where your surgery is being performed.  If you are having back surgery, having someone wash your back for you may be helpful. Wait 2 minutes after CHG soap is applied, then you may rinse off the CHG soap.  Pat dry with a clean towel  Put on clean clothes/pajamas   If you choose to wear lotion, please use ONLY the CHG-compatible lotions on the back of this paper.   Additional instructions for the day of surgery: DO NOT APPLY any lotions, deodorants, cologne, or perfumes.   Do not bring valuables to the hospital. [facility] is not responsible for any belongings/valuables. Do not wear nail polish, gel polish, artificial nails, or any other type of covering on natural nails (fingers and toes) Do not wear jewelry or makeup Put on clean/comfortable clothes.  Please brush your teeth.  Ask your nurse before applying any prescription medications to the skin.     CHG Compatible Lotions   Aveeno Moisturizing lotion  Cetaphil Moisturizing Cream  Cetaphil Moisturizing Lotion  Clairol Herbal Essence Moisturizing Lotion, Dry Skin  Clairol Herbal Essence Moisturizing Lotion, Extra Dry Skin  Clairol Herbal Essence Moisturizing Lotion, Normal Skin  Curel Age Defying Therapeutic Moisturizing Lotion with Alpha Hydroxy  Curel Extreme Care Body Lotion  Curel Soothing Hands Moisturizing Hand Lotion  Curel Therapeutic Moisturizing Cream, Fragrance-Free  Curel Therapeutic Moisturizing Lotion, Fragrance-Free  Curel Therapeutic Moisturizing Lotion, Original Formula  Eucerin Daily Replenishing Lotion  Eucerin Dry Skin Therapy Plus Alpha Hydroxy Crme  Eucerin Dry Skin Therapy Plus Alpha Hydroxy Lotion  Eucerin Original Crme  Eucerin Original Lotion   Eucerin Plus Crme Eucerin Plus Lotion  Eucerin TriLipid Replenishing Lotion  Keri Anti-Bacterial Hand Lotion  Keri Deep Conditioning Original Lotion Dry Skin Formula Softly Scented  Keri Deep Conditioning Original Lotion, Fragrance Free Sensitive Skin Formula  Keri Lotion Fast Absorbing Fragrance Free Sensitive Skin Formula  Keri Lotion Fast Absorbing Softly Scented Dry Skin Formula  Keri Original Lotion  Keri Skin Renewal Lotion Keri Silky Smooth Lotion  Keri Silky Smooth Sensitive Skin Lotion  Nivea Body Creamy Conditioning Oil  Nivea Body Extra Enriched Lotion  Nivea Body Original Lotion  Nivea Body Sheer Moisturizing Lotion Nivea Crme  Nivea Skin Firming Lotion  NutraDerm 30 Skin Lotion  NutraDerm Skin Lotion  NutraDerm Therapeutic Skin Cream  NutraDerm Therapeutic Skin Lotion  ProShield Protective Hand Cream  Provon moisturizing lotion  Please read over the following fact sheets that you were given.

## 2023-05-12 ENCOUNTER — Other Ambulatory Visit: Payer: Self-pay

## 2023-05-12 ENCOUNTER — Other Ambulatory Visit: Payer: Self-pay | Admitting: Medical Genetics

## 2023-05-12 ENCOUNTER — Encounter (HOSPITAL_COMMUNITY)
Admission: RE | Admit: 2023-05-12 | Discharge: 2023-05-12 | Disposition: A | Discharge status: Home / Self Care | Payer: Medicare Other | Source: Ambulatory Visit | Attending: Neurosurgery | Admitting: Neurosurgery

## 2023-05-12 ENCOUNTER — Encounter (HOSPITAL_COMMUNITY): Payer: Self-pay

## 2023-05-12 VITALS — BP 112/64 | HR 64 | Temp 98.1°F | Resp 17 | Ht 63.0 in | Wt 209.5 lb

## 2023-05-12 DIAGNOSIS — F419 Anxiety disorder, unspecified: Secondary | ICD-10-CM | POA: Diagnosis not present

## 2023-05-12 DIAGNOSIS — Z01818 Encounter for other preprocedural examination: Secondary | ICD-10-CM | POA: Diagnosis present

## 2023-05-12 DIAGNOSIS — Z01812 Encounter for preprocedural laboratory examination: Secondary | ICD-10-CM | POA: Insufficient documentation

## 2023-05-12 DIAGNOSIS — K219 Gastro-esophageal reflux disease without esophagitis: Secondary | ICD-10-CM | POA: Diagnosis not present

## 2023-05-12 DIAGNOSIS — F32A Depression, unspecified: Secondary | ICD-10-CM | POA: Insufficient documentation

## 2023-05-12 DIAGNOSIS — D509 Iron deficiency anemia, unspecified: Secondary | ICD-10-CM | POA: Insufficient documentation

## 2023-05-12 DIAGNOSIS — R2689 Other abnormalities of gait and mobility: Secondary | ICD-10-CM | POA: Diagnosis not present

## 2023-05-12 DIAGNOSIS — Z96651 Presence of right artificial knee joint: Secondary | ICD-10-CM | POA: Diagnosis not present

## 2023-05-12 HISTORY — DX: Other complications of anesthesia, initial encounter: T88.59XA

## 2023-05-12 LAB — CBC
HCT: 41 % (ref 36.0–46.0)
Hemoglobin: 13.6 g/dL (ref 12.0–15.0)
MCH: 31.8 pg (ref 26.0–34.0)
MCHC: 33.2 g/dL (ref 30.0–36.0)
MCV: 95.8 fL (ref 80.0–100.0)
Platelets: 158 10*3/uL (ref 150–400)
RBC: 4.28 MIL/uL (ref 3.87–5.11)
RDW: 12.4 % (ref 11.5–15.5)
WBC: 7.9 10*3/uL (ref 4.0–10.5)
nRBC: 0 % (ref 0.0–0.2)

## 2023-05-12 LAB — BASIC METABOLIC PANEL
Anion gap: 5 (ref 5–15)
BUN: 17 mg/dL (ref 8–23)
CO2: 27 mmol/L (ref 22–32)
Calcium: 9.9 mg/dL (ref 8.9–10.3)
Chloride: 108 mmol/L (ref 98–111)
Creatinine, Ser: 1.01 mg/dL — ABNORMAL HIGH (ref 0.44–1.00)
GFR, Estimated: >60 mL/min (ref >60)
Glucose, Bld: 96 mg/dL (ref 70–99)
Potassium: 4.4 mmol/L (ref 3.5–5.1)
Sodium: 140 mmol/L (ref 135–145)

## 2023-05-12 LAB — SURGICAL PCR SCREEN
MRSA, PCR: NEGATIVE
Staphylococcus aureus: NEGATIVE

## 2023-05-12 NOTE — Progress Notes (Signed)
PCP - Harlow Mares, FNP Cardiologist - Denies  PPM/ICD - Denies Device Orders - n/a Rep Notified - n/a  Chest x-ray - Denies EKG - Denies Stress Test - Denies  ECHO - Denies Cardiac Cath - Denies  Sleep Study - Denies CPAP - n/a  No DM  Last dose of GLP1 agonist- n/a GLP1 instructions: n/a  Blood Thinner Instructions: n/a Aspirin Instructions: n/a  NPO after midnight  COVID TEST- n/a   Anesthesia review: Yes. Pt dx with sinusitis 11/27 and will complete Augmentin course today, 12/4. She has had symptom resolution since Sunday, Dec. 1st.    Patient denies shortness of breath, fever, cough and chest pain at PAT appointment. Other than above, pt denies any respiratory illness/infection in the last two onths.    All instructions explained to the patient, with a verbal understanding of the material. Patient agrees to go over the instructions while at home for a better understanding. Patient also instructed to self quarantine after being tested for COVID-19. The opportunity to ask questions was provided.

## 2023-05-14 ENCOUNTER — Other Ambulatory Visit (HOSPITAL_COMMUNITY)
Admission: RE | Admit: 2023-05-14 | Discharge: 2023-05-14 | Disposition: A | Discharge status: Home / Self Care | Payer: Self-pay | Source: Ambulatory Visit | Attending: Medical Genetics | Admitting: Medical Genetics

## 2023-05-14 NOTE — Progress Notes (Signed)
Anesthesia Chart Review:  62 year old female with pertinent history including iron deficiency anemia, GERD, anxiety, depression.  Patient seen by PCP 05/05/2023 with complaints of sinus congestion.  She was prescribed 7-day course of Augmentin for sinusitis.  Was also discussed that she was having increasing problems with her balance and that this was going to be addressed with upcoming cervical spine surgery on 05/18/2023.  Patient reported feeling well.  Mission testing appointment, stated sinusitis symptoms resolved around December 1.  She denies any respiratory complaints.  Patient reports history of 1 episode of "uncontrollable shaking" after knee replacement done in [place].  I reviewed anesthesia records from R TKA 08/04/2017 from [facility], no complications were noted.  She also reports she has had surgery prior to and since that time without complication.  Preop labs reviewed, unremarkable.     Zannie Cove [facility] Short Stay Center/Anesthesiology Phone [phone] 05/14/2023 10:22 AM

## 2023-05-14 NOTE — Anesthesia Preprocedure Evaluation (Signed)
Anesthesia Evaluation  Patient identified by MRN, date of birth, ID band Patient awake    Reviewed: Allergy & Precautions, H&P , NPO status , Patient's Chart, lab work & pertinent test results  Airway Mallampati: II   Neck ROM: full    Dental   Pulmonary former smoker   breath sounds clear to auscultation       Cardiovascular negative cardio ROS  Rhythm:regular Rate:Normal     Neuro/Psych  PSYCHIATRIC DISORDERS Anxiety Depression     Neuromuscular disease    GI/Hepatic ,GERD  ,,  Endo/Other    Renal/GU      Musculoskeletal  (+) Arthritis ,    Abdominal   Peds  Hematology   Anesthesia Other Findings   Reproductive/Obstetrics                             Anesthesia Physical Anesthesia Plan  ASA: 2  Anesthesia Plan: General   Post-op Pain Management:    Induction: Intravenous  PONV Risk Score and Plan: 3 and Ondansetron, Dexamethasone, Midazolam and Treatment may vary due to age or medical condition  Airway Management Planned: Oral ETT and Video Laryngoscope Planned  Additional Equipment:   Intra-op Plan:   Post-operative Plan: Extubation in OR  Informed Consent: I have reviewed the patients History and Physical, chart, labs and discussed the procedure including the risks, benefits and alternatives for the proposed anesthesia with the patient or authorized representative who has indicated his/her understanding and acceptance.     Dental advisory given  Plan Discussed with: CRNA, Anesthesiologist and Surgeon  Anesthesia Plan Comments: (PAT note by Antionette Poles, PA-C: 62 year old female with pertinent history including iron deficiency anemia, GERD, anxiety, depression.  Patient seen by PCP 05/05/2023 with complaints of sinus congestion.  She was prescribed 7-day course of Augmentin for sinusitis.  Was also discussed that she was having increasing problems with her balance and  that this was going to be addressed with upcoming cervical spine surgery on 05/18/2023.  Patient reported feeling well.  [facility] testing appointment, stated sinusitis symptoms resolved around December 1.  She denies any respiratory complaints.  Patient reports history of 1 episode of "uncontrollable shaking" after knee replacement done in [place].  I reviewed anesthesia records from R TKA 08/04/2017 from [facility], no complications were noted.  She also reports she has had surgery prior to and since that time without complication.  Preop labs reviewed, unremarkable.  )        Anesthesia Quick Evaluation

## 2023-05-18 ENCOUNTER — Other Ambulatory Visit: Payer: Self-pay

## 2023-05-18 ENCOUNTER — Inpatient Hospital Stay (HOSPITAL_COMMUNITY)
Admission: RE | Admit: 2023-05-18 | Discharge: 2023-05-19 | DRG: 472 | Disposition: A | Discharge status: Home / Self Care | Payer: Medicare Other | Attending: Neurosurgery | Admitting: Neurosurgery

## 2023-05-18 ENCOUNTER — Encounter (HOSPITAL_COMMUNITY): Payer: Self-pay

## 2023-05-18 ENCOUNTER — Inpatient Hospital Stay (HOSPITAL_COMMUNITY): Payer: Medicare Other

## 2023-05-18 ENCOUNTER — Inpatient Hospital Stay (HOSPITAL_COMMUNITY): Payer: Medicare Other | Admitting: Physician Assistant

## 2023-05-18 ENCOUNTER — Encounter (HOSPITAL_COMMUNITY)
Admission: RE | Disposition: A | Discharge status: Home / Self Care | Payer: Self-pay | Source: Home / Self Care | Attending: Neurosurgery

## 2023-05-18 ENCOUNTER — Inpatient Hospital Stay (HOSPITAL_COMMUNITY): Payer: Medicare Other | Admitting: Certified Registered Nurse Anesthetist

## 2023-05-18 DIAGNOSIS — Z91040 Latex allergy status: Secondary | ICD-10-CM

## 2023-05-18 DIAGNOSIS — Z87891 Personal history of nicotine dependence: Secondary | ICD-10-CM | POA: Diagnosis not present

## 2023-05-18 DIAGNOSIS — G2581 Restless legs syndrome: Secondary | ICD-10-CM | POA: Diagnosis present

## 2023-05-18 DIAGNOSIS — Z96651 Presence of right artificial knee joint: Secondary | ICD-10-CM | POA: Diagnosis present

## 2023-05-18 DIAGNOSIS — Z79899 Other long term (current) drug therapy: Secondary | ICD-10-CM

## 2023-05-18 DIAGNOSIS — E7800 Pure hypercholesterolemia, unspecified: Secondary | ICD-10-CM | POA: Diagnosis present

## 2023-05-18 DIAGNOSIS — Z9049 Acquired absence of other specified parts of digestive tract: Secondary | ICD-10-CM

## 2023-05-18 DIAGNOSIS — Z98891 History of uterine scar from previous surgery: Secondary | ICD-10-CM

## 2023-05-18 DIAGNOSIS — Z6837 Body mass index (BMI) 37.0-37.9, adult: Secondary | ICD-10-CM

## 2023-05-18 DIAGNOSIS — M4712 Other spondylosis with myelopathy, cervical region: Principal | ICD-10-CM | POA: Diagnosis present

## 2023-05-18 DIAGNOSIS — M1712 Unilateral primary osteoarthritis, left knee: Secondary | ICD-10-CM | POA: Diagnosis present

## 2023-05-18 DIAGNOSIS — Z818 Family history of other mental and behavioral disorders: Secondary | ICD-10-CM | POA: Diagnosis not present

## 2023-05-18 DIAGNOSIS — Z803 Family history of malignant neoplasm of breast: Secondary | ICD-10-CM | POA: Diagnosis not present

## 2023-05-18 DIAGNOSIS — Z841 Family history of disorders of kidney and ureter: Secondary | ICD-10-CM

## 2023-05-18 DIAGNOSIS — K219 Gastro-esophageal reflux disease without esophagitis: Secondary | ICD-10-CM | POA: Diagnosis present

## 2023-05-18 DIAGNOSIS — D509 Iron deficiency anemia, unspecified: Secondary | ICD-10-CM | POA: Diagnosis present

## 2023-05-18 DIAGNOSIS — M810 Age-related osteoporosis without current pathological fracture: Secondary | ICD-10-CM | POA: Diagnosis present

## 2023-05-18 DIAGNOSIS — Z9884 Bariatric surgery status: Secondary | ICD-10-CM | POA: Diagnosis not present

## 2023-05-18 DIAGNOSIS — Z8041 Family history of malignant neoplasm of ovary: Secondary | ICD-10-CM | POA: Diagnosis not present

## 2023-05-18 DIAGNOSIS — M4802 Spinal stenosis, cervical region: Secondary | ICD-10-CM | POA: Diagnosis present

## 2023-05-18 DIAGNOSIS — E6609 Other obesity due to excess calories: Secondary | ICD-10-CM | POA: Diagnosis present

## 2023-05-18 DIAGNOSIS — E66811 Obesity, class 1: Secondary | ICD-10-CM | POA: Diagnosis present

## 2023-05-18 DIAGNOSIS — M545 Low back pain, unspecified: Secondary | ICD-10-CM | POA: Diagnosis present

## 2023-05-18 DIAGNOSIS — Z811 Family history of alcohol abuse and dependence: Secondary | ICD-10-CM

## 2023-05-18 DIAGNOSIS — Z90710 Acquired absence of both cervix and uterus: Secondary | ICD-10-CM

## 2023-05-18 DIAGNOSIS — F411 Generalized anxiety disorder: Secondary | ICD-10-CM | POA: Diagnosis present

## 2023-05-18 DIAGNOSIS — F424 Excoriation (skin-picking) disorder: Secondary | ICD-10-CM | POA: Diagnosis present

## 2023-05-18 DIAGNOSIS — Z8249 Family history of ischemic heart disease and other diseases of the circulatory system: Secondary | ICD-10-CM

## 2023-05-18 DIAGNOSIS — Z791 Long term (current) use of non-steroidal anti-inflammatories (NSAID): Secondary | ICD-10-CM

## 2023-05-18 DIAGNOSIS — Z8659 Personal history of other mental and behavioral disorders: Secondary | ICD-10-CM

## 2023-05-18 DIAGNOSIS — F5101 Primary insomnia: Secondary | ICD-10-CM | POA: Diagnosis present

## 2023-05-18 DIAGNOSIS — Z882 Allergy status to sulfonamides status: Secondary | ICD-10-CM

## 2023-05-18 DIAGNOSIS — Z885 Allergy status to narcotic agent status: Secondary | ICD-10-CM

## 2023-05-18 DIAGNOSIS — Z9889 Other specified postprocedural states: Secondary | ICD-10-CM

## 2023-05-18 DIAGNOSIS — G8929 Other chronic pain: Secondary | ICD-10-CM | POA: Diagnosis present

## 2023-05-18 DIAGNOSIS — Z981 Arthrodesis status: Secondary | ICD-10-CM | POA: Diagnosis not present

## 2023-05-18 HISTORY — PX: ANTERIOR CERVICAL DECOMP/DISCECTOMY FUSION: SHX1161

## 2023-05-18 LAB — TYPE AND SCREEN
ABO/RH(D): O POS
Antibody Screen: POSITIVE

## 2023-05-18 SURGERY — ANTERIOR CERVICAL DECOMPRESSION/DISCECTOMY FUSION 3 LEVELS
Anesthesia: General | Site: Spine Cervical

## 2023-05-18 MED ORDER — SODIUM CHLORIDE 0.9% FLUSH: 3.0000 mL | Freq: Two times a day (BID) | INTRAVENOUS | Status: DC

## 2023-05-18 MED ORDER — POLYETHYLENE GLYCOL 3350 17 G PO PACK
17.0000 g | PACK | Freq: Every day | ORAL | Status: DC | PRN
Start: 2023-05-18 — End: 2023-05-19

## 2023-05-18 MED ORDER — PANTOPRAZOLE SODIUM 20 MG PO TBEC: 20.0000 mg | DELAYED_RELEASE_TABLET | Freq: Every day | ORAL | Status: DC

## 2023-05-18 MED ORDER — HYDROCODONE-ACETAMINOPHEN 5-325 MG PO TABS
1.0000 | ORAL_TABLET | ORAL | Status: DC | PRN
  Administered 2023-05-18 – 2023-05-19 (×2): 1 via ORAL
  Filled 2023-05-18: qty 1

## 2023-05-18 MED ORDER — CHLORHEXIDINE GLUCONATE CLOTH 2 % EX PADS
6.0000 | MEDICATED_PAD | Freq: Once | CUTANEOUS | Status: DC
Start: 2023-05-18 — End: 2023-05-18

## 2023-05-18 MED ORDER — ROCURONIUM BROMIDE 10 MG/ML (PF) SYRINGE
PREFILLED_SYRINGE | INTRAVENOUS | Status: DC | PRN
  Administered 2023-05-18 (×4): 50 mg via INTRAVENOUS

## 2023-05-18 MED ORDER — FENTANYL CITRATE (PF) 100 MCG/2ML IJ SOLN
25.0000 ug | INTRAMUSCULAR | Status: DC | PRN
Start: 2023-05-18 — End: 2023-05-18
  Administered 2023-05-18 (×3): 50 ug via INTRAVENOUS

## 2023-05-18 MED ORDER — 0.9 % SODIUM CHLORIDE (POUR BTL) OPTIME
TOPICAL | Status: DC | PRN
  Administered 2023-05-18: 1000 mL

## 2023-05-18 MED ORDER — LIDOCAINE 2% (20 MG/ML) 5 ML SYRINGE
INTRAMUSCULAR | Status: DC | PRN
  Administered 2023-05-18: 40 mg via INTRAVENOUS

## 2023-05-18 MED ORDER — FENTANYL CITRATE (PF) 250 MCG/5ML IJ SOLN
INTRAMUSCULAR | Status: AC
  Filled 2023-05-18: qty 5

## 2023-05-18 MED ORDER — LACTATED RINGERS IV SOLN
INTRAVENOUS | Status: DC
Start: 2023-05-18 — End: 2023-05-18

## 2023-05-18 MED ORDER — MIDAZOLAM HCL 2 MG/2ML IJ SOLN
INTRAMUSCULAR | Status: DC | PRN
  Administered 2023-05-18: 2 mg via INTRAVENOUS

## 2023-05-18 MED ORDER — DOCUSATE SODIUM 100 MG PO CAPS
100.0000 mg | ORAL_CAPSULE | Freq: Two times a day (BID) | ORAL | Status: DC
  Filled 2023-05-18 (×2): qty 1

## 2023-05-18 MED ORDER — ONDANSETRON HCL 4 MG/2ML IJ SOLN
INTRAMUSCULAR | Status: DC | PRN
  Administered 2023-05-18: 4 mg via INTRAVENOUS

## 2023-05-18 MED ORDER — DEXAMETHASONE SODIUM PHOSPHATE 10 MG/ML IJ SOLN
INTRAMUSCULAR | Status: DC | PRN
  Administered 2023-05-18: 10 mg via INTRAVENOUS

## 2023-05-18 MED ORDER — FAMOTIDINE 20 MG PO TABS
20.0000 mg | ORAL_TABLET | Freq: Two times a day (BID) | ORAL | Status: DC
  Administered 2023-05-18: 20 mg via ORAL
  Filled 2023-05-18: qty 1

## 2023-05-18 MED ORDER — BUSPIRONE HCL 15 MG PO TABS
15.0000 mg | ORAL_TABLET | Freq: Three times a day (TID) | ORAL | Status: DC
  Administered 2023-05-18 (×2): 15 mg via ORAL
  Filled 2023-05-18 (×2): qty 1

## 2023-05-18 MED ORDER — FENTANYL CITRATE (PF) 100 MCG/2ML IJ SOLN
INTRAMUSCULAR | Status: AC
  Filled 2023-05-18: qty 2

## 2023-05-18 MED ORDER — PHENOL 1.4 % MT LIQD: 1.0000 | OROMUCOSAL | Status: DC | PRN

## 2023-05-18 MED ORDER — PROPOFOL 10 MG/ML IV BOLUS
INTRAVENOUS | Status: AC
  Filled 2023-05-18: qty 20

## 2023-05-18 MED ORDER — MIDAZOLAM HCL 2 MG/2ML IJ SOLN
INTRAMUSCULAR | Status: AC
  Filled 2023-05-18: qty 2

## 2023-05-18 MED ORDER — GLYCOPYRROLATE PF 0.2 MG/ML IJ SOSY
PREFILLED_SYRINGE | INTRAMUSCULAR | Status: DC | PRN
  Administered 2023-05-18: .2 mg via INTRAVENOUS

## 2023-05-18 MED ORDER — CHLORHEXIDINE GLUCONATE 0.12 % MT SOLN
15.0000 mL | Freq: Once | OROMUCOSAL | Status: AC
  Administered 2023-05-18: 15 mL via OROMUCOSAL
  Filled 2023-05-18: qty 15

## 2023-05-18 MED ORDER — HYDROMORPHONE HCL 1 MG/ML IJ SOLN: 0.5000 mg | INTRAMUSCULAR | Status: DC | PRN

## 2023-05-18 MED ORDER — ACETAMINOPHEN 650 MG RE SUPP: 650.0000 mg | RECTAL | Status: DC | PRN

## 2023-05-18 MED ORDER — LIDOCAINE-EPINEPHRINE 1 %-1:100000 IJ SOLN
INTRAMUSCULAR | Status: DC | PRN
  Administered 2023-05-18: 3.5 mL

## 2023-05-18 MED ORDER — TRAZODONE HCL 50 MG PO TABS
200.0000 mg | ORAL_TABLET | Freq: Every day | ORAL | Status: DC
  Administered 2023-05-18: 200 mg via ORAL
  Filled 2023-05-18: qty 1

## 2023-05-18 MED ORDER — CEFAZOLIN SODIUM-DEXTROSE 2-4 GM/100ML-% IV SOLN
2.0000 g | Freq: Four times a day (QID) | INTRAVENOUS | Status: AC
  Administered 2023-05-18 (×2): 2 g via INTRAVENOUS
  Filled 2023-05-18 (×2): qty 100

## 2023-05-18 MED ORDER — SODIUM CHLORIDE 0.9 % IV SOLN: 250.0000 mL | INTRAVENOUS | Status: DC

## 2023-05-18 MED ORDER — KETOROLAC TROMETHAMINE 15 MG/ML IJ SOLN
15.0000 mg | Freq: Four times a day (QID) | INTRAMUSCULAR | Status: AC
  Administered 2023-05-18 – 2023-05-19 (×4): 15 mg via INTRAVENOUS
  Filled 2023-05-18 (×4): qty 1

## 2023-05-18 MED ORDER — HYDROCODONE-ACETAMINOPHEN 5-325 MG PO TABS
2.0000 | ORAL_TABLET | ORAL | Status: DC | PRN
  Administered 2023-05-18 – 2023-05-19 (×3): 2 via ORAL
  Filled 2023-05-18 (×4): qty 2

## 2023-05-18 MED ORDER — DEXMEDETOMIDINE HCL IN NACL 80 MCG/20ML IV SOLN
INTRAVENOUS | Status: DC | PRN
  Administered 2023-05-18 (×5): 4 ug via INTRAVENOUS

## 2023-05-18 MED ORDER — ALBUMIN HUMAN 5 % IV SOLN: INTRAVENOUS | Status: DC | PRN

## 2023-05-18 MED ORDER — THROMBIN 5000 UNITS EX SOLR: OROMUCOSAL | Status: DC | PRN

## 2023-05-18 MED ORDER — ONDANSETRON HCL 4 MG PO TABS
4.0000 mg | ORAL_TABLET | Freq: Four times a day (QID) | ORAL | Status: DC | PRN
Start: 2023-05-18 — End: 2023-05-19

## 2023-05-18 MED ORDER — THROMBIN 5000 UNITS EX SOLR
CUTANEOUS | Status: AC
  Filled 2023-05-18: qty 5000

## 2023-05-18 MED ORDER — OXYCODONE HCL 5 MG PO TABS
5.0000 mg | ORAL_TABLET | Freq: Once | ORAL | Status: AC | PRN
  Administered 2023-05-18: 5 mg via ORAL

## 2023-05-18 MED ORDER — OXYCODONE HCL 5 MG PO TABS
ORAL_TABLET | ORAL | Status: AC
  Filled 2023-05-18: qty 1

## 2023-05-18 MED ORDER — METHOCARBAMOL 500 MG PO TABS
500.0000 mg | ORAL_TABLET | Freq: Four times a day (QID) | ORAL | Status: DC | PRN
  Administered 2023-05-18 (×2): 500 mg via ORAL
  Filled 2023-05-18 (×2): qty 1

## 2023-05-18 MED ORDER — ONDANSETRON HCL 4 MG/2ML IJ SOLN: 4.0000 mg | Freq: Four times a day (QID) | INTRAMUSCULAR | Status: DC | PRN

## 2023-05-18 MED ORDER — LIDOCAINE-EPINEPHRINE 1 %-1:100000 IJ SOLN
INTRAMUSCULAR | Status: AC
Start: 2023-05-18 — End: ?
  Filled 2023-05-18: qty 1

## 2023-05-18 MED ORDER — MENTHOL 3 MG MT LOZG: 1.0000 | LOZENGE | OROMUCOSAL | Status: DC | PRN

## 2023-05-18 MED ORDER — METHOCARBAMOL 1000 MG/10ML IJ SOLN: 500.0000 mg | Freq: Four times a day (QID) | INTRAMUSCULAR | Status: DC | PRN

## 2023-05-18 MED ORDER — FLEET ENEMA RE ENEM: 1.0000 | ENEMA | Freq: Once | RECTAL | Status: DC | PRN

## 2023-05-18 MED ORDER — FENTANYL CITRATE (PF) 250 MCG/5ML IJ SOLN
INTRAMUSCULAR | Status: DC | PRN
  Administered 2023-05-18 (×2): 100 ug via INTRAVENOUS

## 2023-05-18 MED ORDER — ONDANSETRON HCL 4 MG/2ML IJ SOLN
4.0000 mg | Freq: Four times a day (QID) | INTRAMUSCULAR | Status: DC | PRN
Start: 2023-05-18 — End: 2023-05-19

## 2023-05-18 MED ORDER — SUGAMMADEX SODIUM 200 MG/2ML IV SOLN
INTRAVENOUS | Status: DC | PRN
  Administered 2023-05-18: 200 mg via INTRAVENOUS

## 2023-05-18 MED ORDER — PROPOFOL 10 MG/ML IV BOLUS
INTRAVENOUS | Status: DC | PRN
  Administered 2023-05-18: 100 mg via INTRAVENOUS
  Administered 2023-05-18: 30 mg via INTRAVENOUS
  Administered 2023-05-18: 50 mg via INTRAVENOUS

## 2023-05-18 MED ORDER — BUPIVACAINE HCL 0.5 % IJ SOLN
INTRAMUSCULAR | Status: DC | PRN
  Administered 2023-05-18: 3.5 mL

## 2023-05-18 MED ORDER — SERTRALINE HCL 100 MG PO TABS: 200.0000 mg | ORAL_TABLET | Freq: Every day | ORAL | Status: DC

## 2023-05-18 MED ORDER — SODIUM CHLORIDE 0.9% FLUSH: 3.0000 mL | INTRAVENOUS | Status: DC | PRN

## 2023-05-18 MED ORDER — BUPIVACAINE HCL (PF) 0.5 % IJ SOLN
INTRAMUSCULAR | Status: AC
  Filled 2023-05-18: qty 30

## 2023-05-18 MED ORDER — CEFAZOLIN SODIUM-DEXTROSE 2-4 GM/100ML-% IV SOLN
2.0000 g | INTRAVENOUS | Status: AC
  Administered 2023-05-18 (×2): 2 g via INTRAVENOUS
  Filled 2023-05-18: qty 100

## 2023-05-18 MED ORDER — ACETAMINOPHEN 325 MG PO TABS: 650.0000 mg | ORAL_TABLET | ORAL | Status: DC | PRN

## 2023-05-18 MED ORDER — ORAL CARE MOUTH RINSE: 15.0000 mL | Freq: Once | OROMUCOSAL | Status: AC

## 2023-05-18 MED ORDER — PHENYLEPHRINE HCL-NACL 20-0.9 MG/250ML-% IV SOLN
INTRAVENOUS | Status: DC | PRN
  Administered 2023-05-18: 15 ug/min via INTRAVENOUS

## 2023-05-18 MED ORDER — OXYCODONE HCL 5 MG/5ML PO SOLN: 5.0000 mg | Freq: Once | ORAL | Status: AC | PRN

## 2023-05-18 SURGICAL SUPPLY — 59 items
BAG COUNTER SPONGE SURGICOUNT (BAG) ×1 IMPLANT
BASKET BONE COLLECTION (BASKET) IMPLANT
BENZOIN TINCTURE PRP APPL 2/3 (GAUZE/BANDAGES/DRESSINGS) ×1 IMPLANT
BIT DRILL NEURO 2X3.1 SFT TUCH (MISCELLANEOUS) ×1 IMPLANT
BLADE CLIPPER SURG (BLADE) IMPLANT
BUR MATCHSTICK NEURO 3.0 LAGG (BURR) ×1 IMPLANT
CANISTER SUCT 3000ML PPV (MISCELLANEOUS) ×1 IMPLANT
DEVICE ENDSKLTN IMPL 16X14X7X6 (Cage) IMPLANT
DEVICE ENDSKLTN TC NANOLCK 6MM (Cage) IMPLANT
DRAPE C-ARM 42X72 X-RAY (DRAPES) ×2 IMPLANT
DRAPE HALF SHEET 40X57 (DRAPES) IMPLANT
DRAPE LAPAROTOMY 100X72 PEDS (DRAPES) ×1 IMPLANT
DRAPE MICROSCOPE SLANT 54X150 (MISCELLANEOUS) ×1 IMPLANT
DRESSING MEPILEX FLEX 4X4 (GAUZE/BANDAGES/DRESSINGS) ×1 IMPLANT
DRILL NEURO 2X3.1 SOFT TOUCH (MISCELLANEOUS) ×1
DRSG MEPILEX FLEX 4X4 (GAUZE/BANDAGES/DRESSINGS) ×1
DRSG MEPILEX POST OP 4X12 (GAUZE/BANDAGES/DRESSINGS) IMPLANT
DRSG OPSITE 4X5.5 SM (GAUZE/BANDAGES/DRESSINGS) ×2 IMPLANT
DURAPREP 26ML APPLICATOR (WOUND CARE) ×1 IMPLANT
ELECT COATED BLADE 2.86 ST (ELECTRODE) ×1 IMPLANT
ELECT REM PT RETURN 9FT ADLT (ELECTROSURGICAL) ×1
ELECTRODE REM PT RTRN 9FT ADLT (ELECTROSURGICAL) ×1 IMPLANT
ENDOSKELETON IMPLANT 16X14X7X6 (Cage) ×2 IMPLANT
ENDOSKELETON TC NANOLOCK 6MM (Cage) ×1 IMPLANT
GAUZE 4X4 16PLY ~~LOC~~+RFID DBL (SPONGE) IMPLANT
GLOVE BIO SURGEON STRL SZ7 (GLOVE) ×2 IMPLANT
GLOVE BIOGEL PI IND STRL 7.5 (GLOVE) ×2 IMPLANT
GLOVE ECLIPSE 7.5 STRL STRAW (GLOVE) ×1 IMPLANT
GOWN STRL REUS W/ TWL LRG LVL3 (GOWN DISPOSABLE) ×2 IMPLANT
GOWN STRL REUS W/ TWL XL LVL3 (GOWN DISPOSABLE) ×1 IMPLANT
GOWN STRL REUS W/TWL 2XL LVL3 (GOWN DISPOSABLE) IMPLANT
HEMOSTAT POWDER KIT SURGIFOAM (HEMOSTASIS) ×1 IMPLANT
KIT BASIN OR (CUSTOM PROCEDURE TRAY) ×1 IMPLANT
KIT TURNOVER KIT B (KITS) ×1 IMPLANT
NDL HYPO 22X1.5 SAFETY MO (MISCELLANEOUS) ×1 IMPLANT
NDL SPNL 22GX3.5 QUINCKE BK (NEEDLE) ×1 IMPLANT
NEEDLE HYPO 22X1.5 SAFETY MO (MISCELLANEOUS) ×1 IMPLANT
NEEDLE SPNL 22GX3.5 QUINCKE BK (NEEDLE) ×1 IMPLANT
NS IRRIG 1000ML POUR BTL (IV SOLUTION) ×1 IMPLANT
PACK LAMINECTOMY NEURO (CUSTOM PROCEDURE TRAY) ×1 IMPLANT
PAD ARMBOARD 7.5X6 YLW CONV (MISCELLANEOUS) ×3 IMPLANT
PIN DISTRACTION 14MM (PIN) ×2 IMPLANT
PLATE ZEVO 3LVL 55MM (Plate) IMPLANT
PUTTY DBF 1CC CORTICAL FIBERS (Putty) IMPLANT
SCREW VA SD 3.5X16 (Screw) IMPLANT
SPIKE FLUID TRANSFER (MISCELLANEOUS) ×1 IMPLANT
SPONGE INTESTINAL PEANUT (DISPOSABLE) ×1 IMPLANT
STAPLER VISISTAT 35W (STAPLE) IMPLANT
STRIP CLOSURE SKIN 1/2X4 (GAUZE/BANDAGES/DRESSINGS) ×1 IMPLANT
SUT MNCRL AB 4-0 PS2 18 (SUTURE) ×1 IMPLANT
SUT SILK 2 0 TIES 10X30 (SUTURE) IMPLANT
SUT VIC AB 2-0 CT1 TAPERPNT 27 (SUTURE) IMPLANT
SUT VIC AB 3-0 SH 8-18 (SUTURE) ×1 IMPLANT
TAPE CLOTH 3X10 TAN LF (GAUZE/BANDAGES/DRESSINGS) ×1 IMPLANT
TAPE STRIPS DRAPE STRL (GAUZE/BANDAGES/DRESSINGS) IMPLANT
TIP KERRISON THIN FOOTPLATE 2M (MISCELLANEOUS) ×1 IMPLANT
TOWEL GREEN STERILE (TOWEL DISPOSABLE) ×1 IMPLANT
TOWEL GREEN STERILE FF (TOWEL DISPOSABLE) ×1 IMPLANT
WATER STERILE IRR 1000ML POUR (IV SOLUTION) ×1 IMPLANT

## 2023-05-18 NOTE — H&P (Addendum)
CC: neck pain  HPI:     Patient is a 62 y.o. female presents with cervical spondylitic myelopathy, here for elective ACDF.    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 03/09/2023   Tick bite of right lower leg 01/26/2023   Cervical radiculopathy 10/12/2022   Skin-picking disorder 10/12/2022   Class 1 obesity due to excess calories with serious comorbidity and body mass index (BMI) of 32.0 to 32.9 in adult 04/16/2022   Gastroesophageal reflux disease without esophagitis 10/09/2021   Iron deficiency anemia 04/16/2021   Pure hypercholesterolemia 01/15/2021   Chronic neck pain 01/15/2021   Constipation 11/06/2020   Unilateral primary osteoarthritis, left knee 10/23/2020   Primary insomnia with snoring and restless legs 07/15/2020   Controlled substance agreement signed 07/15/2020   Major depressive disorder, recurrent episode, moderate (HCC) 07/15/2020   Generalized edema 07/15/2020   Morbid obesity (HCC) 01/15/2020   Chronic bilateral low back pain without sciatica 01/15/2020   Generalized anxiety disorder    Past Medical History:  Diagnosis Date   Acquired dilation of bile duct    Anemia 06/27/2000   Iron Deficiency   Anxiety    Arthritis 05/14/1998   Blood type O+    Chronic neck and back pain    Complication of anesthesia    Uncontrollable shaking after knee replacement 2018   Compulsive skin picking 1998   Depression    GERD (gastroesophageal reflux disease)    History of iron deficiency anemia    Hyperlipidemia    Insomnia    Osteoporosis 07/08/2017   T-score -2.6 in 2019; -2.2 on 08/05/2018   Vitamin D deficiency     Past Surgical History:  Procedure Laterality Date   ABDOMINAL HYSTERECTOMY  2006   CESAREAN SECTION  02/16/1990   CHOLECYSTECTOMY  2001   COLONOSCOPY     ENDOMETRIAL ABLATION W/ NOVASURE     GASTRIC BYPASS  1998   HEMORRHOID SURGERY  2003    HERNIA REPAIR  2004   Umbilical   KNEE ARTHROSCOPY Right 2008   MOUTH SURGERY     All teeth removed   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   In [place]    Medications Prior to Admission  Medication Sig Dispense Refill Last Dose   B Complex-C (SUPER B COMPLEX PO) Take 1 tablet by mouth daily.   Past Week   baclofen (LIORESAL) 10 MG tablet Take 1 tablet by mouth three times daily as needed for muscle spasm 90 tablet 0 05/18/2023 at 0400   busPIRone (BUSPAR) 15 MG tablet Take 1 tablet (15 mg total) by mouth 3 (three) times daily. 270 tablet 3 05/18/2023 at 0400   calcium carbonate (OSCAL) 1500 (600 Ca) MG TABS tablet Take 600 mg of elemental calcium by mouth 2 (two) times daily with a meal.   Past Week   celecoxib (CELEBREX) 100 MG capsule Take 1 capsule (100 mg total) by mouth daily. 90 capsule 1 Past Week   Cholecalciferol (VITAMIN D3) 125 MCG (5000 UT) CAPS Take 5,000 Units by mouth daily.   Past Week   CINNAMON PO Take 1 capsule by mouth daily.   Past Week   famotidine (PEPCID) 20 MG tablet Take 20 mg by mouth 2 (two) times daily.   05/18/2023 at 0400   Iron-Vitamin C 65-125 MG TABS Take 1 tablet by mouth 2 (two) times daily.   Past Week  levocetirizine (XYZAL) 5 MG tablet Take 1 tablet (5 mg total) by mouth every evening. 90 tablet 3    mupirocin ointment (BACTROBAN) 2 % Apply 1 Application topically 2 (two) times daily. (Patient taking differently: Apply 1 Application topically 2 (two) times daily as needed (irritation).) 30 g 0 05/17/2023   naloxone (NARCAN) nasal spray 4 mg/0.1 mL Use nasally for overdose 1 each 1    Oxycodone HCl 10 MG TABS Take 1 tablet (10 mg total) by mouth 3 (three) times daily as needed (severe pain). 90 tablet 0 05/18/2023 at 0400   pantoprazole (PROTONIX) 20 MG tablet Take 1 tablet by mouth once daily 90 tablet 1 05/18/2023 at 0400   sertraline (ZOLOFT) 100 MG tablet Take 2 tablets (200 mg total) by mouth daily. 180 tablet 3 05/18/2023 at 0400   traZODone  (DESYREL) 100 MG tablet TAKE 2 TABLETS BY MOUTH AT BEDTIME 180 tablet 3 05/17/2023   Allergies  Allergen Reactions   Demerol [Meperidine] Nausea Only and Other (See Comments)    disorientation    Latex Other (See Comments)    blisters   Morphine Palpitations    SLOW HEART RATE    Sulfa Antibiotics Rash    Social History   Tobacco Use   Smoking status: Former    Current packs/day: 0.00    Types: Cigarettes    Quit date: 06/08/1989    Years since quitting: 33.9   Smokeless tobacco: Never  Substance Use Topics   Alcohol use: Not Currently    Comment: See psychiatry note from 03/09/2023. Pt quit in 2019    Family History  Problem Relation Age of Onset   Anxiety disorder Mother    Depression Mother    Breast cancer Mother    Ovarian cancer Mother    Obesity Mother    Alcohol abuse Father    Heart disease Father    Breast cancer Maternal Aunt    Breast cancer Maternal Uncle    Breast cancer Maternal Grandmother    Breast cancer Maternal Grandfather    Kidney disease Brother    Heart disease Brother      Review of Systems Pertinent items are noted in HPI.  Objective:   Patient Vitals for the past 8 hrs:  BP Temp Temp src Pulse Resp SpO2 Height Weight  05/18/23 0608 117/66 98.2 F (36.8 C) Oral 63 18 97 % 5\' 3"  (1.6 m) 94.8 kg   No intake/output data recorded. No intake/output data recorded.      General : Alert, cooperative, no distress, appears stated age   Head:  Normocephalic/atraumatic    Eyes: PERRL, conjunctiva/corneas clear, EOM's intact. Fundi could not be visualized Neck: Supple Chest:  Respirations unlabored Chest wall: no tenderness or deformity Heart: Regular rate and rhythm Abdomen: Soft, nontender and nondistended Extremities: warm and well-perfused Skin: normal turgor, color and texture Neurologic:  Alert, oriented x 3.  Eyes open spontaneously. PERRL, EOMI, VFC, no facial droop. V1-3 intact.  No dysarthria, tongue protrusion symmetric.   CNII-XII intact. Normal strength, sensation.  3+ reflexes  + Hoffman's bilaterally.  No pronator drift, full strength in legs       Data ReviewCBC:  Lab Results  Component Value Date   WBC 7.9 05/12/2023   RBC 4.28 05/12/2023   BMP:  Lab Results  Component Value Date   GLUCOSE 96 05/12/2023   CO2 27 05/12/2023   BUN 17 05/12/2023   BUN 21 02/12/2023   CREATININE 1.01 (H) 05/12/2023  CALCIUM 9.9 05/12/2023   Radiology review:   See clinic note for details  Assessment:   Active Problems:   * No active hospital problems. *  62 yo F with CSM  Plan:   C4-7 ACDF today - Risks, benefits, alternatives, and expected convalescence were discussed with the patient.  Risks discussed included, but were not limited to bleeding, pain, infection, dysphagia, dysphonia, scar, spinal fluid leak, neurologic deficit, instability, pseudoarthrosis, adjacent segment disease, damage to nearby organs, and death.  Informed consent was obtained.

## 2023-05-18 NOTE — Op Note (Signed)
PREOP DIAGNOSIS: Cervical spondylitic myelopathy  POSTOP DIAGNOSIS: Cervical spondylitic myelopathy  PROCEDURE: 1. Arthrodesis, C4-5 anterior interbody technique, including Discectomy for decompression of spinal cord and exiting nerve roots with foraminotomies  2. Arthrodesis, additional level C5-6 anterior interbody technique, including Discectomy for decompression of spinal cord and exiting nerve roots with foraminotomies  3. Arthrodesis, additional level C6-7 anterior interbody technique, including Discectomy for decompression of spinal cord and exiting nerve roots with foraminotomies  4. Placement of intervertebral biomechanical device C4-5 5. Placement of intervertebral biomechanical device C5-6 6. Placement of intervertebral biomechanical device C6-7 7. Placement of anterior instrumentation consisting of interbody plate and screws C4-5-6-7 8. Use of morselized bone allograft  9. Use of intraoperative microscope  SURGEON: Dr. Hoyt Koch, MD  ASSISTANT:  Patrici Ranks, PA Please note there were no qualified trainees available to assist with the procedure.  Assistance required for retraction of the visceral structures to allow for safe instrumentation.  ANESTHESIA: General Endotracheal  EBL: 50 ml  IMPLANTS: [employer] C4-5: 7 mm medium Titan C cage C5-6: 7 mm medium Titan C cage C6-7: 6 mm medium Titan C cage 55 mm plate 16 mm screws x 8 DBF  SPECIMENS: None  DRAINS: Medium hemovac drain  COMPLICATIONS: None immediate  CONDITION: Hemodynamically stable to PACU  HISTORY: This is a 62 yo F with symptoms of worsening myelopathy who was found to have severe cervical stenosis from degenerative disc disease.  Medical and physical therapy failed to improve her symptoms.    Risks, benefits, alternatives, and expected convalescence were discussed with the patient.  Risks discussed included but were not limited to bleeding, pain, infection, dysphagia, dysphonia,  pseudoarthrosis, hardware failure, adjacent segment disease, CSF leak, neurologic deficits, weakness, numbness, paralysis, coma, and death. After all questions were answered, informed consent was obtained.  PROCEDURE IN DETAIL: The patient was brought to the operating room and transferred to the operative table. After induction of general anesthesia, the patient was positioned on the operative table in the supine position with all pressure points meticulously padded. The skin of the neck was then prepped and draped in the usual sterile fashion.  After timeout was conducted, the skin was infiltrated with local anesthetic. Skin incision was then made sharply and Bovie electrocautery was used to dissect the subcutaneous tissue until the platysma was identified. The platysma was then divided and undermined. The sternocleidomastoid muscle was then identified and, utilizing natural fascial planes in the neck, the prevertebral fascia was identified and the carotid sheath was retracted laterally and the trachea and esophagus retracted medially. Again using fluoroscopy, the C5-6  disc space was identified. Bovie electrocautery was used to dissect in the subperiosteal plane and elevate the bilateral longus coli muscles to expose the C4-5, C5-6, and C6-7 discs.  Self-retaining retractors were then placed at the C4-5 segment. Caspar distraction pins were placed in the adjacent bodies to allow for gentle distraction.  At this point, the microscope was draped and brought into the field, and the remainder of the case was done under the microscope using microdissecting technique.  The disc space was incised sharply and combination of high speed drill, curettes, and rongeurs were use to initially complete a discectomy. The high-speed drill was then used to complete discectomy until the posterior annulus was identified and removed and the posterior longitudinal ligament was identified. Using a nerve hook, the PLL was elevated,  and Kerrison rongeurs were used to remove the posterior longitudinal ligament and the ventral thecal sac was identified.  Using  a combination of curettes and rongeurs, complete decompression of the thecal sac and exiting nerve roots at this level was completed, and verified with easy passage of micro-nerve hook centrally and in the bilateral foramina.  Having completed our decompression, attention was turned to placement of the intervertebral device. Trial spacers were used to select a size 7 mm graft. This graft was then filled with morcellized allograft, and inserted under live fluoroscopy.  Attention was then turned to the C5-6 level. Caspar distraction pin was placed in the adjacent body to allow for gentle distraction of the disc space.  In a similar fashion, discectomy was completed initially with curettes and rongeurs, and completed with the drill. The PLL was again identified, elevated and incised. Using Kerrison rongeurs, decompression of the spinal cord and exiting roots was completed and confirmed with a dissector. Trial spacers were used to select a 6 mm graft. This graft was then filled with morcellized allograft, and inserted under live fluoroscopy.  Attention was then turned to the C6-7 level. Caspar distraction pin was placed in the adjacent body to allow for gentle distraction of the disc space.  In a similar fashion, discectomy was completed initially with curettes and rongeurs, and completed with the drill. The PLL was again identified, elevated and incised. Using Kerrison rongeurs, decompression of the spinal cord and exiting roots was completed and confirmed with a dissector. Trial spacers were used to select a 6 mm graft. This graft was then filled with morcellized allograft, and inserted under live fluoroscopy.  After placement of the intervertebral devices, the caspar pins were removed.  An anterior cervical plate was placed across the interspaces for anterior fixation.  Using a  high-speed drill, the cortex of the cervical vertebral bodies was punctured, and screws inserted in the vertebral bodies. Final fluoroscopic images in AP and lateral projections were taken to confirm good hardware placement.  At this point, after all counts were verified to be correct, meticulous hemostasis was secured using a combination of bipolar electrocautery and passive hemostatics.  A medium Hemovac drain was placed in the deep cervical space and tunneled out the skin and secured with a stitch.  The platysma muscle was then closed using interrupted 3-0 Vicryl sutures, and the skin was closed with a 4-0 monocryl in subcuticular fashion followed by steri-strips. Sterile dressings were then applied and the drapes removed.  The patient tolerated the procedure well and was extubated in the room and taken to the postanesthesia care unit in stable condition.  All counts were correct at the end of the procedure.

## 2023-05-18 NOTE — Progress Notes (Signed)
Orthopedic Tech Progress Note Patient Details:  Hannah [name] [birth year] [id number]  Ortho Devices Type of Ortho Device: Aspen cervical collar Ortho Device/Splint Interventions: Ordered, Adjustment, Application   Post Interventions Patient Tolerated: Well Instructions Provided: Care of device, Adjustment of device  Sherilyn Banker 05/18/2023, 1:21 PM

## 2023-05-18 NOTE — Anesthesia Procedure Notes (Signed)
Procedure Name: Intubation Date/Time: 05/18/2023 8:12 AM  Performed by: Alease Medina, CRNAPre-anesthesia Checklist: Patient identified, Emergency Drugs available, Suction available and Patient being monitored Patient Re-evaluated:Patient Re-evaluated prior to induction Oxygen Delivery Method: Circle system utilized Preoxygenation: Pre-oxygenation with 100% oxygen Induction Type: IV induction Ventilation: Mask ventilation without difficulty Laryngoscope Size: Glidescope and 3 Grade View: Grade I Tube type: Oral Tube size: 7.0 mm Number of attempts: 1 Airway Equipment and Method: Stylet and Oral airway Placement Confirmation: ETT inserted through vocal cords under direct vision, positive ETCO2 and breath sounds checked- equal and bilateral Secured at: 21 cm Tube secured with: Tape Dental Injury: Teeth and Oropharynx as per pre-operative assessment  Comments: Glidescope chosen for symptomatic cervical spine issues

## 2023-05-18 NOTE — Transfer of Care (Signed)
Immediate Anesthesia Transfer of Care Note  Patient: Hannah [name]. Paul  Procedure(s) Performed: Anterior Cervical Decompression Discectomy, Cervical Four-Cervical Five, Cervical five- Cervical Six, Cervical Six - Cervical Seven (Spine Cervical)  Patient Location: PACU  Anesthesia Type:General  Level of Consciousness: drowsy and patient cooperative  Airway & Oxygen Therapy: Patient Spontanous Breathing  Post-op Assessment: Report given to RN, Post -op Vital signs reviewed and stable, and Patient moving all extremities X 4  Post vital signs: Reviewed and stable  Last Vitals:  Vitals Value Taken Time  BP 124/54 05/18/23 1210  Temp    Pulse 76 05/18/23 1213  Resp 19 05/18/23 1213  SpO2 97 % 05/18/23 1213  Vitals shown include unfiled device data.  Last Pain:  Vitals:   05/18/23 1210  TempSrc:   PainSc: 0-No pain      Patients Stated Pain Goal: 0 (05/18/23 4098)  Complications: No notable events documented.

## 2023-05-19 ENCOUNTER — Encounter (HOSPITAL_COMMUNITY): Payer: Self-pay | Admitting: Neurosurgery

## 2023-05-19 ENCOUNTER — Other Ambulatory Visit (HOSPITAL_COMMUNITY): Payer: Self-pay

## 2023-05-19 LAB — TYPE AND SCREEN
ABO/RH(D): O POS
Antibody Screen: POSITIVE
Donor AG Type: NEGATIVE
Donor AG Type: NEGATIVE
PT AG Type: NEGATIVE
Unit division: 0
Unit division: 0

## 2023-05-19 LAB — BPAM RBC
Blood Product Expiration Date: 202412172359
Blood Product Expiration Date: 202412202359
Unit Type and Rh: 5100
Unit Type and Rh: 5100

## 2023-05-19 MED ORDER — METHOCARBAMOL 500 MG PO TABS
500.0000 mg | ORAL_TABLET | Freq: Four times a day (QID) | ORAL | 0 refills | Status: AC | PRN
  Filled 2023-05-19: qty 120, 30d supply, fill #0

## 2023-05-19 MED ORDER — DOCUSATE SODIUM 100 MG PO CAPS
100.0000 mg | ORAL_CAPSULE | Freq: Two times a day (BID) | ORAL | 0 refills | Status: AC
  Filled 2023-05-19: qty 10, 5d supply, fill #0

## 2023-05-19 MED ORDER — HYDROCODONE-ACETAMINOPHEN 5-325 MG PO TABS
1.0000 | ORAL_TABLET | ORAL | 0 refills | Status: DC | PRN
  Filled 2023-05-19: qty 20, 4d supply, fill #0

## 2023-05-19 MED FILL — Thrombin For Soln 5000 Unit: CUTANEOUS | Qty: 5000 | Status: AC

## 2023-05-19 NOTE — Telephone Encounter (Unsigned)
Copied from [id number]. Topic: Clinical - Medical Advice >> May 19, 2023  4:00 PM Mosetta Putt H wrote: Reason for CRM: needs brace changed needs help putting it on the correct way

## 2023-05-19 NOTE — Discharge Summary (Signed)
Patient ID: Hannah [name]. Capo MRN: [id number] DOB/AGE: 1961/05/16 62 y.o.  Admit date: 05/18/2023 Discharge date: 05/19/2023  Admission Diagnoses: Cervical spinal stenosis [M48.02]   Discharge Diagnoses: Same   Discharged Condition: Stable  Hospital Course:  Hannah [name]. Perrette is a 62 y.o. female who was admitted following an uncomplicated ACDF C4-7. They were recovered in PACU and transferred to [facility]. Hospital course was uncomplicated. Pt stable for discharge today. Pt to f/u in office for routine post op visit. Pt is in agreement w/ plan.    Discharge Exam: Blood pressure 116/70, pulse 72, temperature 98.3 F (36.8 C), temperature source Oral, resp. rate 20, height 5\' 3"  (1.6 m), weight 94.8 kg, SpO2 98%. A&O x3 Speech fluent, appropriate Strength 5/5 x4.  SILTx4.  Dressing c/d/I.   Disposition: Discharge disposition: 01-Home or Self Care       Discharge Instructions     Incentive spirometry RT   Complete by: As directed       Allergies as of 05/19/2023       Reactions   Demerol [meperidine] Nausea Only, Other (See Comments)   disorientation   Latex Other (See Comments)   blisters   Morphine Palpitations   SLOW HEART RATE   Sulfa Antibiotics Rash        Medication List     STOP taking these medications    baclofen 10 MG tablet Commonly known as: LIORESAL   celecoxib 100 MG capsule Commonly known as: CELEBREX   Oxycodone HCl 10 MG Tabs       TAKE these medications    busPIRone 15 MG tablet Commonly known as: BUSPAR Take 1 tablet (15 mg total) by mouth 3 (three) times daily.   calcium carbonate 1500 (600 Ca) MG Tabs tablet Commonly known as: OSCAL Take 600 mg of elemental calcium by mouth 2 (two) times daily with a meal.   CINNAMON PO Take 1 capsule by mouth daily.   docusate sodium 100 MG capsule Commonly known as: COLACE Take 1 capsule (100 mg total) by mouth 2 (two) times daily.   famotidine 20 MG tablet Commonly known as:  PEPCID Take 20 mg by mouth 2 (two) times daily.   HYDROcodone-acetaminophen 5-325 MG tablet Commonly known as: NORCO/VICODIN Take 1 tablet by mouth every 4 (four) hours as needed for moderate pain (pain score 4-6).   Iron-Vitamin C 65-125 MG Tabs Take 1 tablet by mouth 2 (two) times daily.   levocetirizine 5 MG tablet Commonly known as: XYZAL Take 1 tablet (5 mg total) by mouth every evening.   methocarbamol 500 MG tablet Commonly known as: ROBAXIN Take 1 tablet (500 mg total) by mouth every 6 (six) hours as needed for muscle spasms.   mupirocin ointment 2 % Commonly known as: BACTROBAN Apply 1 Application topically 2 (two) times daily. What changed:  when to take this reasons to take this   naloxone 4 MG/0.1ML Liqd nasal spray kit Commonly known as: NARCAN Use nasally for overdose   pantoprazole 20 MG tablet Commonly known as: PROTONIX Take 1 tablet by mouth once daily   sertraline 100 MG tablet Commonly known as: Zoloft Take 2 tablets (200 mg total) by mouth daily.   SUPER B COMPLEX PO Take 1 tablet by mouth daily.   traZODone 100 MG tablet Commonly known as: DESYREL TAKE 2 TABLETS BY MOUTH AT BEDTIME   Vitamin D3 125 MCG (5000 UT) Caps Take 5,000 Units by mouth daily.         Signed: Huntley Dec  CAYLIN Briannia Laba 05/19/2023, 10:04 AM

## 2023-05-19 NOTE — Progress Notes (Signed)
PT Cancellation Note and Discharge  Patient Details Name: Hannah [name]. Goldwater MRN: [id number] DOB: 1960-11-17   Cancelled Treatment:    Reason Eval/Treat Not Completed: PT screened, no needs identified, will sign off. Discussed pt case with OT who reports pt is currently mobilizing at a modified independent level and does not require a formal PT evaluation at this time. PT signing off. If needs change, please reconsult.     Marylynn Pearson 05/19/2023, 9:47 AM  Conni Slipper, PT, DPT Acute Rehabilitation Services Secure Chat Preferred Office: [phone]

## 2023-05-19 NOTE — Plan of Care (Signed)
Pt doing well. Pt given D/C instructions with verbal understanding. Rx's were given to the Pt at D/C by [facility] pharmacy. Pt's incision is clean and dry with no sign of infection. Pt's IV was removed prior to D/C. Pt D/C'd home via wheelchair per MD order. Pt is stable @ D/C and has no other needs at this time. Rema Fendt, RN

## 2023-05-19 NOTE — Evaluation (Signed)
Occupational Therapy Evaluation Patient Details Name: Hannah [name]. Hannah [name] Hannah [name]: Hannah [name] Hannah [name]: Hannah [name] Today's Date: 05/19/2023   History of Present Illness Pt is a 62 y.o. female s/p ACDF C4-7 with placement of intervertebral biomechanical device. PMH significant for GERD, insomnia, major depressive disorder, arthritis, HLD, osteoporosis.   Clinical Impression   PTA, pt lived with a roommate and was independent in ADL and IADL. Upon eval, pt mod I for UB ADL and LB ADL. Needing up to supervision for tub transfer and stairs; roommate available to provide assist at home if needed. Pt educated and demonstrating compensatory techniques for bed mobility, UB ADL, LB ADL, brace application, toileting, shower transfers, car tranfers, and grooming within precautions. Reviewed recommendations for mobility frequency. All education provided and questions answered. Recommending discharge home with no OT follow up at this time. OT to sign off. Please re-consult if change in status.        If plan is discharge home, recommend the following: Other (comment);Help with stairs or ramp for entrance (on pt request)    Functional Status Assessment  Patient has had a recent decline in their functional status and demonstrates the ability to make significant improvements in function in a reasonable and predictable amount of time.  Equipment Recommendations  None recommended by OT (defers need for shower chair)    Recommendations for Other Services       Precautions / Restrictions Precautions Precautions: Cervical Precaution Booklet Issued: Yes (comment) Precaution Comments: All precautions reviewed within the context of ADL and mobility Restrictions Weight Bearing Restrictions: No      Mobility Bed Mobility Overal bed mobility: Modified Independent             General bed mobility comments: good demo of log roll    Transfers Overall transfer level: Modified independent Equipment used: None                       Balance Overall balance assessment: Mild deficits observed, not formally tested                                         ADL either performed or assessed with clinical judgement   ADL Overall ADL's : Needs assistance/impaired Eating/Feeding: Modified independent   Grooming: Modified independent   Upper Body Bathing: Modified independent   Lower Body Bathing: Modified independent   Upper Body Dressing : Modified independent   Lower Body Dressing: Modified independent Lower Body Dressing Details (indicate cue type and reason): after cues to optimize technique within precautions Toilet Transfer: Modified Independent   Toileting- Clothing Manipulation and Hygiene: Modified independent   Tub/ Shower Transfer: Tub transfer;Supervision/safety;Ambulation   Functional mobility during ADLs: Modified independent General ADL Comments: supervision A for stairs; reporting friend cheyan can give her a hand since she does not have hand rails     Vision Baseline Vision/History: 1 Wears glasses Ability to See in Adequate Light: 0 Adequate Patient Visual Report: No change from baseline Vision Assessment?: No apparent visual deficits     Perception Perception: Not tested       Praxis Praxis: Not tested       Pertinent Vitals/Pain Pain Assessment Pain Assessment: Faces Faces Pain Scale: Hurts a little bit Pain Location: operative site Pain Descriptors / Indicators: Operative site guarding Pain Intervention(s): Limited activity within patient's tolerance, Monitored during session     Extremity/Trunk Assessment  Upper Extremity Assessment Upper Extremity Assessment: Right hand dominant (prior to surgery, LUE pain; resolved)   Lower Extremity Assessment Lower Extremity Assessment: Overall WFL for tasks assessed   Cervical / Trunk Assessment Cervical / Trunk Assessment: Neck Surgery   Communication Communication Communication: No  apparent difficulties   Cognition Arousal: Alert Behavior During Therapy: WFL for tasks assessed/performed Overall Cognitive Status: Within Functional Limits for tasks assessed                                       General Comments       Exercises     Shoulder Instructions      Home Living Family/patient expects to be discharged to:: Private residence Living Arrangements: Non-relatives/Friends Clearence Cheek, Corrie Dandy) Available Help at Discharge: Friend(s) Type of Home: House Home Access: Stairs to enter [employer] of Steps: 2-3 Entrance Stairs-Rails: None Home Layout: One level     Bathroom Shower/Tub: Chief Strategy Officer: Handicapped height (comfort height?)     Home Equipment: None          Prior Functioning/Environment Prior Level of Function : Independent/Modified Independent;Driving             Mobility Comments: no AD ADLs Comments: Independent in ADL and IADL, grocery shopping, med management        OT Problem List: Decreased strength;Impaired balance (sitting and/or standing);Decreased activity tolerance      OT Treatment/Interventions:      OT Goals(Current goals can be found in the care plan section) Acute Rehab OT Goals Patient Stated Goal: go home today OT Goal Formulation: With patient  OT Frequency:      Co-evaluation              AM-PAC OT "6 Clicks" Daily Activity     Outcome Measure Help from another person eating meals?: None Help from another person taking care of personal grooming?: None Help from another person toileting, which includes using toliet, bedpan, or urinal?: None Help from another person bathing (including washing, rinsing, drying)?: A Little Help from another person to put on and taking off regular upper body clothing?: None Help from another person to put on and taking off regular lower body clothing?: None 6 Click Score: 23   End of Session Equipment Utilized During  Treatment: Cervical collar (reviewed donning/doffing/wear schedule and cleaning) Nurse Communication: Mobility status  Activity Tolerance: Patient tolerated treatment well Patient left: in bed;with call bell/phone within reach  OT Visit Diagnosis: Unsteadiness on feet (R26.81);Muscle weakness (generalized) (M62.81)                Time: 5409-8119 OT Time Calculation (min): 22 min Charges:  OT General Charges $OT Visit: 1 Visit OT Evaluation $OT Eval Low Complexity: 1 Low  Tyler Deis, OTR/L [facility] Acute Rehabilitation Office: [phone]   Myrla Halsted 05/19/2023, 9:40 AM

## 2023-05-20 ENCOUNTER — Telehealth: Payer: Self-pay | Admitting: *Deleted

## 2023-05-20 ENCOUNTER — Encounter: Payer: Self-pay | Admitting: *Deleted

## 2023-05-20 NOTE — Anesthesia Postprocedure Evaluation (Signed)
Anesthesia Post Note  Patient: Hannah [name]. Jeffreys  Procedure(s) Performed: Anterior Cervical Decompression Discectomy, Cervical Four-Cervical Five, Cervical five- Cervical Six, Cervical Six - Cervical Seven (Spine Cervical)     Patient location during evaluation: PACU Anesthesia Type: General Level of consciousness: awake and alert Pain management: pain level controlled Vital Signs Assessment: post-procedure vital signs reviewed and stable Respiratory status: spontaneous breathing, nonlabored ventilation, respiratory function stable and patient connected to nasal cannula oxygen Cardiovascular status: blood pressure returned to baseline and stable Postop Assessment: no apparent nausea or vomiting Anesthetic complications: no   No notable events documented.  Last Vitals:  Vitals:   05/19/23 0321 05/19/23 0731  BP: 115/64 116/70  Pulse: 73 72  Resp: 18 20  Temp: 37.1 C 36.8 C  SpO2: 96% 98%    Last Pain:  Vitals:   05/19/23 1200  TempSrc:   PainSc: 4                  Darshay Deupree S

## 2023-05-20 NOTE — Transitions of Care (Post Inpatient/ED Visit) (Signed)
05/20/2023  Name: Bana Goel. Burridge MRN: [id number] DOB: 1960/07/14  Today's TOC FU Call Status: Today's TOC FU Call Status:: Successful TOC FU Call Completed TOC FU Call Complete Date: 05/20/23 Patient's Name and Date of Birth confirmed.  Transition Care Management Follow-up Telephone Call Date of Discharge: 05/19/23 Discharge Facility: Other [facility]) Name of Other (Non[facility]) Discharge Facility: [facility] Type of Discharge: Inpatient Admission Primary Inpatient Discharge Diagnosis:: Admission Diagnoses: Cervical spinal stenosis How have you been since you were released from the hospital?:  (pain is managed with medication, ambulating w/ out difficulty, reports bowel movements regular, eating well, friend drives pt to appts., has meds and taking as prescribed) Any questions or concerns?: No  Items Reviewed: Did you receive and understand the discharge instructions provided?: Yes Medications obtained,verified, and reconciled?: Yes (Medications Reviewed) Any new allergies since your discharge?: No Dietary orders reviewed?: Yes Type of Diet Ordered:: heart healthy Do you have support at home?: Yes People in Home: spouse Name of Support/Comfort Primary Source: lives with friend Nekoda Lehane Reviewed signs/ symptoms of infection and reportable s/s Reviewed pain management strategies, importance of taking pain medication as prescribed Pt declined enrollment in 30 day program  Medications Reviewed Today: Medications Reviewed Today     Reviewed by Audrie Gallus, RN (Registered Nurse) on 05/20/23 at 1132  Med List Status: <None>   Medication Order Taking? Sig Documenting Provider Last Dose Status Informant  B Complex-C (SUPER B COMPLEX PO) [id number] Yes Take 1 tablet by mouth daily. [provider] Taking Active Self  busPIRone (BUSPAR) 15 MG tablet [id number] Yes Take 1 tablet (15 mg total) by mouth 3 (three) times daily. Gabriel Earing, FNP Taking Active Self  calcium carbonate (OSCAL) 1500 (600 Ca) MG TABS tablet [id number] Yes Take 600 mg of elemental calcium by mouth 2 (two) times daily with a meal. [provider] Taking Active Self  Cholecalciferol (VITAMIN D3) 125 MCG (5000 UT) CAPS [id number] Yes Take 5,000 Units by mouth daily. [provider] Taking Active Self  CINNAMON PO [id number] Yes Take 1 capsule by mouth daily. [provider] Taking Active Self  docusate sodium (COLACE) 100 MG capsule [id number] Yes Take 1 capsule (100 mg total) by mouth 2 (two) times daily. Patrici Ranks [place], PA-C Taking Active   famotidine (PEPCID) 20 MG tablet [id number] Yes Take 20 mg by mouth 2 (two) times daily. [provider] Taking Active Self  HYDROcodone-acetaminophen (NORCO/VICODIN) 5-325 MG tablet [id number] Yes Take 1 tablet by mouth every 4 (four) hours as needed for moderate pain (pain score 4-6). Patrici Ranks [place], PA-C Taking Active   Iron-Vitamin C 65-125 MG TABS [id number] Yes Take 1 tablet by mouth 2 (two) times daily. [provider] Taking Active Self  levocetirizine (XYZAL) 5 MG tablet [id number] Yes Take 1 tablet (5 mg total) by mouth every evening. Gabriel Earing, FNP Taking Active Self  methocarbamol (ROBAXIN) 500 MG tablet [id number] Yes Take 1 tablet (500 mg total) by mouth every 6 (six) hours as needed for muscle spasms. Patrici Ranks [place], PA-C Taking Active   mupirocin ointment (BACTROBAN) 2 % [id number] Yes Apply 1 Application topically 2 (two) times daily.  Patient taking differently: Apply 1 Application topically 2 (two) times daily as needed (irritation).   Gabriel Earing, FNP Taking Active Self  naloxone [facility]) nasal spray 4 mg/0.1 mL [id number] Yes Use nasally for overdose Rakes, Doralee Albino, FNP Taking Active Self  Med Note Virgie Dad   Tue May 18, 2023  6:26 AM) Never used per patient  pantoprazole (PROTONIX) 20 MG tablet [id number] Yes  Take 1 tablet by mouth once daily Gabriel Earing, FNP Taking Active Self  sertraline (ZOLOFT) 100 MG tablet [id number] Yes Take 2 tablets (200 mg total) by mouth daily. Gabriel Earing, FNP Taking Active Self  traZODone (DESYREL) 100 MG tablet [id number] Yes TAKE 2 TABLETS BY MOUTH AT BEDTIME Gabriel Earing, FNP Taking Active Self            Home Care and Equipment/Supplies: Were Home Health Services Ordered?: No Any new equipment or medical supplies ordered?: No  Functional Questionnaire: Do you need assistance with bathing/showering or dressing?: No Do you need assistance with meal preparation?: No Do you need assistance with eating?: No Do you have difficulty maintaining continence: No Do you need assistance with getting out of bed/getting out of a chair/moving?: No Do you have difficulty managing or taking your medications?: No  Follow up appointments reviewed: PCP Follow-up appointment confirmed?: No MD Provider Line Number:[phone] Given: No (pt states she will see surgeon and will not be following up with PCP) Specialist Hospital Follow-up appointment confirmed?: Yes Date of Specialist follow-up appointment?: 06/04/23 Follow-Up Specialty Provider:: Dr. Lelon Huh Neurosurgery Do you need transportation to your follow-up appointment?: No (friend provides transportation) Do you understand care options if your condition(s) worsen?: Yes-patient verbalized understanding  SDOH Interventions Today    Flowsheet Row Most Recent Value  SDOH Interventions   Food Insecurity Interventions Intervention Not Indicated  Housing Interventions Intervention Not Indicated  Transportation Interventions Intervention Not Indicated  Utilities Interventions Intervention Not Indicated       Irving Shows [facility], BSN RN Care Manager/ Transition of Care [place]/ [facility] [phone]

## 2023-05-22 ENCOUNTER — Other Ambulatory Visit: Payer: Self-pay | Admitting: Family Medicine

## 2023-05-22 DIAGNOSIS — G8929 Other chronic pain: Secondary | ICD-10-CM

## 2023-05-24 LAB — GENECONNECT MOLECULAR SCREEN: Genetic Analysis Overall Interpretation: NEGATIVE

## 2023-06-16 ENCOUNTER — Other Ambulatory Visit: Payer: Self-pay | Admitting: Family Medicine

## 2023-06-16 DIAGNOSIS — G8929 Other chronic pain: Secondary | ICD-10-CM

## 2023-06-17 ENCOUNTER — Ambulatory Visit: Payer: Self-pay | Admitting: Family Medicine

## 2023-06-17 NOTE — Telephone Encounter (Signed)
 Patient has new medical insurance:  [employer]: Providers: [phone]  Effective: 06/09/2023 MID: [id number] Plan: [phone]) [id number]  Claims Details: [address] [address] [place], [place] [address]  ----- Message from [place] sent at 06/16/2023  2:33 PM EST ----- Patient is requesting to have Dr. Joesph to call her directly regarding her situation of medication issue that she is experiencing from the after effects of her neck surgery. Can you please reach out to the patient at [phone]. Thank you.

## 2023-06-18 ENCOUNTER — Ambulatory Visit: Payer: Self-pay | Admitting: Family Medicine

## 2023-06-18 MED ORDER — BACLOFEN 10 MG PO TABS: 10.0000 mg | ORAL_TABLET | Freq: Three times a day (TID) | ORAL | 0 refills | Status: DC

## 2023-06-18 NOTE — Telephone Encounter (Signed)
 Looks like neurosurgery has been prescriping Norco since her surgery. Per PDMP review she picked up a 10 days supply of this on 06/16/23. So I am not able to refill this right now.   Ok to refill baclofen.

## 2023-06-18 NOTE — Addendum Note (Signed)
 Addended by: Hessie Diener on: 06/18/2023 02:00 PM   Modules accepted: Orders

## 2023-06-18 NOTE — Telephone Encounter (Signed)
 Copied from CRM [id number]. Topic: Clinical - Medication Refill >> Jun 17, 2023  3:27 PM Hannah [name] wrote: Most Recent Primary Care Visit:  Provider: [name]  Department: [facility]  Visit Type: ACUTE  Date: 05/05/2023  Medication: Baclofen  10 MG  Has the patient contacted their pharmacy? Yes (Agent: If no, request that the patient contact the pharmacy for the refill. If patient does not wish to contact the pharmacy document the reason why and proceed with request.) (Agent: If yes, when and what did the pharmacy advise?)  Is this the correct pharmacy for this prescription? Yes If no, delete pharmacy and type the correct one.  This is the patient's preferred pharmacy:  [id number] - [place], [place] - [address] [place] [address] Phone: [phone] Fax: [phone]   Has the prescription been filled recently? No  Is the patient out of the medication? Yes  Has the patient been seen for an appointment in the last year OR does the patient have an upcoming appointment? Yes  Can we respond through MyChart? Yes  Agent: Please be advised that Rx refills may take up to 3 business days. We ask that you follow-up with your pharmacy.    Chief Complaint: Medication renewal Symptoms: Back spasms Frequency: constant Pertinent Negatives: Patient denies new symptoms Disposition: [] ED /[] Urgent Care (no appt availability in office) / [] Appointment(In office/virtual)/ []  [place] [facility]/ [] Home Care/ [] Refused Recommended Disposition /[] [facility]/ [x]  Follow-up with PCP Additional Notes: Patient discharged after NSG on 12/11, was taken OFF of Baclofen  and Oxycodone  and prescribed Methocarbamol  and Hydrocodone /Acet, Pt reports allergic reaction to both medications and states that she expressed that to the prescribing provider at that time, but she was still discharged with them. Patient states that she stopped taking them  about 2 weeks ago, because of the itching getting worse, and is now requesting a refill of the previous order. Pt also requesting, instead of Oxycodone  10mg , she'd like to try Oxycodone  5mg  instead. This RN will route request to clinical pool for follow-up. Pt has been advised that refill/renewals may take up 3 days, pt verbalized understanding.    Reason for Disposition  Caller requesting a CONTROLLED substance prescription refill (e.g., narcotics, ADHD medicines)  Answer Assessment - Initial Assessment Questions 1. DRUG NAME: What medicine do you need to have refilled?     Baclofen  10mg  and Oxycodone  10mg   2. REFILLS REMAINING: How many refills are remaining? (Note: The label on the medicine or pill bottle will show how many refills are remaining. If there are no refills remaining, then a renewal may be needed.)     No refills  3. EXPIRATION DATE: What is the expiration date? (Note: The label states when the prescription will expire, and thus can no longer be refilled.)   Baclofen  (04/24/2024); Oxycodone  10mg  (05/27/24)  4. PRESCRIBING HCP: Who prescribed it? Reason: If prescribed by specialist, call should be referred to that group.     Annabella HERO, NP  5. SYMPTOMS: Do you have any symptoms?  Lower back spasms, spasms in arms      6. PREGNANCY: Is there any chance that you are pregnant? When was your last menstrual period?     N/A  Protocols used: Medication Refill and Renewal Call-A-AH

## 2023-06-18 NOTE — Telephone Encounter (Signed)
 Pt aware we can't send in pain medication but we are sending in her baclofen.

## 2023-06-22 ENCOUNTER — Other Ambulatory Visit: Payer: Self-pay | Admitting: Family Medicine

## 2023-06-22 DIAGNOSIS — K219 Gastro-esophageal reflux disease without esophagitis: Secondary | ICD-10-CM

## 2023-06-27 ENCOUNTER — Encounter: Payer: Self-pay | Admitting: Family Medicine

## 2023-06-27 DIAGNOSIS — M545 Low back pain, unspecified: Secondary | ICD-10-CM

## 2023-06-27 DIAGNOSIS — Z79899 Other long term (current) drug therapy: Secondary | ICD-10-CM

## 2023-06-28 MED ORDER — OXYCODONE HCL 10 MG PO TABS: 10.0000 mg | ORAL_TABLET | Freq: Three times a day (TID) | ORAL | 0 refills | Status: DC | PRN

## 2023-07-07 ENCOUNTER — Telehealth: Payer: Self-pay | Admitting: Neurology

## 2023-07-07 NOTE — Telephone Encounter (Signed)
LVM and sent mychart msg informing pt of appt time change on 07/26/23 due to MD being out

## 2023-07-14 ENCOUNTER — Other Ambulatory Visit: Payer: Self-pay | Admitting: Family Medicine

## 2023-07-18 ENCOUNTER — Other Ambulatory Visit: Payer: Self-pay | Admitting: Family Medicine

## 2023-07-19 MED ORDER — BACLOFEN 10 MG PO TABS: 10.0000 mg | ORAL_TABLET | Freq: Three times a day (TID) | ORAL | 0 refills | Status: DC

## 2023-07-26 ENCOUNTER — Institutional Professional Consult (permissible substitution): Payer: Medicare Other | Admitting: Neurology

## 2023-07-29 ENCOUNTER — Telehealth (INDEPENDENT_AMBULATORY_CARE_PROVIDER_SITE_OTHER): Payer: Medicare PPO | Admitting: Family Medicine

## 2023-07-29 ENCOUNTER — Encounter: Payer: Self-pay | Admitting: Family Medicine

## 2023-07-29 ENCOUNTER — Ambulatory Visit: Payer: Medicare PPO | Admitting: Family Medicine

## 2023-07-29 DIAGNOSIS — M25521 Pain in right elbow: Secondary | ICD-10-CM

## 2023-07-29 DIAGNOSIS — Z79899 Other long term (current) drug therapy: Secondary | ICD-10-CM

## 2023-07-29 DIAGNOSIS — R42 Dizziness and giddiness: Secondary | ICD-10-CM

## 2023-07-29 DIAGNOSIS — W19XXXA Unspecified fall, initial encounter: Secondary | ICD-10-CM

## 2023-07-29 DIAGNOSIS — M545 Low back pain, unspecified: Secondary | ICD-10-CM

## 2023-07-29 DIAGNOSIS — M542 Cervicalgia: Secondary | ICD-10-CM | POA: Diagnosis not present

## 2023-07-29 DIAGNOSIS — M25561 Pain in right knee: Secondary | ICD-10-CM

## 2023-07-29 DIAGNOSIS — S4992XA Unspecified injury of left shoulder and upper arm, initial encounter: Secondary | ICD-10-CM | POA: Diagnosis not present

## 2023-07-29 DIAGNOSIS — M25522 Pain in left elbow: Secondary | ICD-10-CM

## 2023-07-29 DIAGNOSIS — G8929 Other chronic pain: Secondary | ICD-10-CM

## 2023-07-29 DIAGNOSIS — S4991XA Unspecified injury of right shoulder and upper arm, initial encounter: Secondary | ICD-10-CM

## 2023-07-29 MED ORDER — OXYCODONE HCL 10 MG PO TABS: 10.0000 mg | ORAL_TABLET | Freq: Three times a day (TID) | ORAL | 0 refills | Status: DC | PRN
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
## 2023-07-30 ENCOUNTER — Telehealth: Payer: Self-pay

## 2023-07-30 ENCOUNTER — Ambulatory Visit (INDEPENDENT_AMBULATORY_CARE_PROVIDER_SITE_OTHER): Payer: Medicare PPO

## 2023-07-30 DIAGNOSIS — M79602 Pain in left arm: Secondary | ICD-10-CM

## 2023-07-30 DIAGNOSIS — M25522 Pain in left elbow: Secondary | ICD-10-CM | POA: Diagnosis not present

## 2023-07-30 DIAGNOSIS — M25561 Pain in right knee: Secondary | ICD-10-CM

## 2023-07-30 NOTE — Telephone Encounter (Signed)
Call patient to come on in for xray.

## 2023-07-30 NOTE — Addendum Note (Signed)
Addended by: Gabriel Earing on: 07/30/2023 08:46 AM   Modules accepted: Orders

## 2023-07-30 NOTE — Telephone Encounter (Signed)
Copied from CRM [id number]. Topic: Appointments - Scheduling Inquiry for Clinic >> Jul 30, 2023 12:38 PM Suzette B wrote: Reason for CRM: Patient stated she was not aware of the appt that was scheduled for Radiology, she states she needs someone to call her in regards to getting another appointment for radiology done.

## 2023-08-02 ENCOUNTER — Other Ambulatory Visit: Payer: Self-pay | Admitting: Family Medicine

## 2023-08-02 ENCOUNTER — Encounter: Payer: Self-pay | Admitting: Family Medicine

## 2023-08-02 DIAGNOSIS — M25561 Pain in right knee: Secondary | ICD-10-CM

## 2023-08-02 DIAGNOSIS — S4992XA Unspecified injury of left shoulder and upper arm, initial encounter: Secondary | ICD-10-CM

## 2023-08-02 MED ORDER — MELOXICAM 15 MG PO TABS: 15.0000 mg | ORAL_TABLET | Freq: Every day | ORAL | 0 refills | Status: AC

## 2023-08-06 ENCOUNTER — Encounter: Payer: Self-pay | Admitting: Family Medicine

## 2023-08-06 NOTE — Telephone Encounter (Signed)
 Apt scheduled.

## 2023-08-09 ENCOUNTER — Encounter: Payer: Self-pay | Admitting: Family Medicine

## 2023-08-09 ENCOUNTER — Ambulatory Visit (INDEPENDENT_AMBULATORY_CARE_PROVIDER_SITE_OTHER): Payer: Medicare PPO | Admitting: Family Medicine

## 2023-08-09 VITALS — BP 99/67 | HR 71 | Temp 98.5°F | Ht 63.0 in | Wt 217.2 lb

## 2023-08-09 DIAGNOSIS — F424 Excoriation (skin-picking) disorder: Secondary | ICD-10-CM | POA: Diagnosis not present

## 2023-08-09 DIAGNOSIS — Z79899 Other long term (current) drug therapy: Secondary | ICD-10-CM

## 2023-08-09 DIAGNOSIS — G2581 Restless legs syndrome: Secondary | ICD-10-CM

## 2023-08-09 DIAGNOSIS — M545 Low back pain, unspecified: Secondary | ICD-10-CM | POA: Diagnosis not present

## 2023-08-09 DIAGNOSIS — L0390 Cellulitis, unspecified: Secondary | ICD-10-CM

## 2023-08-09 DIAGNOSIS — G8929 Other chronic pain: Secondary | ICD-10-CM

## 2023-08-09 DIAGNOSIS — Z0184 Encounter for antibody response examination: Secondary | ICD-10-CM

## 2023-08-09 MED ORDER — OXYCODONE HCL 10 MG PO TABS: 10.0000 mg | ORAL_TABLET | Freq: Three times a day (TID) | ORAL | 0 refills | Status: AC | PRN

## 2023-08-09 MED ORDER — MUPIROCIN 2 % EX OINT
1.0000 | TOPICAL_OINTMENT | Freq: Two times a day (BID) | CUTANEOUS | 0 refills | Status: AC
Start: 2023-08-09 — End: 2023-08-19

## 2023-08-09 MED ORDER — CEPHALEXIN 500 MG PO CAPS: 500.0000 mg | ORAL_CAPSULE | Freq: Four times a day (QID) | ORAL | 0 refills | Status: AC

## 2023-08-09 MED ORDER — RISPERIDONE 0.5 MG PO TABS: 0.5000 mg | ORAL_TABLET | Freq: Two times a day (BID) | ORAL | 1 refills | Status: AC

## 2023-08-09 NOTE — Addendum Note (Signed)
 Addended by: Gabriel Earing on: 08/09/2023 03:29 PM   Modules accepted: Level of Service

## 2023-08-09 NOTE — Progress Notes (Addendum)
 Acute Office Visit  Subjective:     Patient ID: Hannah [name], female    DOB: 03-11-1961, 63 y.o.   MRN: [id number]  Chief Complaint  Patient presents with   Wound Check    HPI Patient is in today for a wound check. Skin picking disorder has increased significantly over the last 2-3 weeks. Hannah [name] is picking at her hands, arms, legs, and even her face. Increased stress recently has led to increased anxiety. Hannah [name] has been compliant with buspar, zoloft. Taking trazodone for sleep. Restarted on NAC earlier this week. Previously tried this, but wan't sure it was benefical. Has tried hydroxyzine without relief. Previously was on geodon but doesn't wish to try this again. Has seen [facility] to establish but has not had follow up as Hannah [name] had planned to move to [facility]. Move to [facility] has now been pushed back to May at least. Hannah [name] has been applying bactroban. One a cluster of spots on her right anterior lower leg that has redness surrounding with tenderness and clear yellow drainage.   Will be traveling to [place] in 2 weeks to an area of a measles outbreak. Unsure if Hannah [name] is vaccinated.   Needs additional refills on pain medication. Was unable to get to office due to weather, so last visit was conducted virtually on 07/29/23. Only 30 day supply was sent at that time without any refills.      05/05/2023    8:27 AM 04/08/2023    8:16 AM 03/11/2023    1:08 PM  Depression screen PHQ 2/9  Decreased Interest 0 0 0  Down, Depressed, Hopeless 1 0 0  PHQ - 2 Score 1 0 0  Altered sleeping 0 0 0  Tired, decreased energy 1 1 0  Change in appetite 2 0 1  Feeling bad or failure about yourself  0 0 0  Trouble concentrating 0 0 0  Moving slowly or fidgety/restless 0 0 0  Suicidal thoughts 0 0 0  PHQ-9 Score 4 1 1   Difficult doing work/chores Somewhat difficult Not difficult at all Not difficult at all      05/05/2023    8:28 AM 04/08/2023    8:16 AM 03/11/2023    1:08 PM 01/20/2023    9:15 AM  GAD 7 : Generalized  Anxiety Score  Nervous, Anxious, on Edge 1 1 1  0  Control/stop worrying 1 1 0 0  Worry too much - different things 1 0 0 0  Trouble relaxing 0 0 0 0  Restless 0 0 0 0  Easily annoyed or irritable 1 0 0 1  Afraid - awful might happen 0 0 0 0  Total GAD 7 Score 4 2 1 1   Anxiety Difficulty Not difficult at all Not difficult at all Not difficult at all Somewhat difficult     ROS As per HPI.      Objective:    BP 99/67   Pulse 71   Temp 98.5 F (36.9 C) (Temporal)   Ht 5\' 3"  (1.6 m)   Wt 217 lb 3.2 oz (98.5 kg)   SpO2 99%   BMI 38.48 kg/m    Physical Exam Vitals and nursing note reviewed.  Constitutional:      General: Hannah [name] is not in acute distress.    Appearance: Hannah [name] is not ill-appearing, toxic-appearing or diaphoretic.  Pulmonary:     Effort: No respiratory distress.     Breath sounds: Normal breath sounds.  Musculoskeletal:     Right lower leg:  No edema.     Left lower leg: No edema.  Skin:    General: Skin is warm and dry.     Comments: Excoriations to bilateral hands, arms, lower legs. 3 small shallow wounds to right LE with surrounding erythema, tenderness- no exudate or warmth.   Neurological:     General: No focal deficit present.     Mental Status: Hannah [name] is alert and oriented to person, place, and time.  Psychiatric:        Attention and Perception: Attention normal.        Mood and Affect: Affect is tearful.        Speech: Speech normal.        Behavior: Behavior normal.        Thought Content: Thought content normal.        Cognition and Memory: Cognition normal.        Judgment: Judgment normal.     No results found for any visits on 08/09/23.      Assessment & Plan:   [place] was seen today for wound check.  Diagnoses and all orders for this visit:  Wound cellulitis Keflex as below. Keep area dressed, clean with warm soap and water. Apply mupirocin to other areas as needed. Return to office for new or worsening symptoms, or if symptoms persist.   -     cephALEXin (KEFLEX) 500 MG capsule; Take 1 capsule (500 mg total) by mouth 4 (four) times daily for 7 days. -     mupirocin ointment (BACTROBAN) 2 %; Apply 1 Application topically 2 (two) times daily for 10 days.  Skin-picking disorder Uncontrolled currently. Continue buspar, zoloft, NAC. Will try risperidone as below. Hannah [name] will message me in 1 week to let me know how Hannah [name] is feeling. Discussed scheduling appt with [facility] for follow up as well.  -     risperiDONE (RISPERDAL) 0.5 MG tablet; Take 1 tablet (0.5 mg total) by mouth 2 (two) times daily.  Chronic bilateral low back pain without sciatica Controlled substance agreement signed PDMP reviewed, no red flags. CSA and UDS are UTD. Refill provided.  -     Oxycodone HCl 10 MG TABS; Take 1 tablet (10 mg total) by mouth every 8 (eight) hours as needed. -     Oxycodone HCl 10 MG TABS; Take 1 tablet (10 mg total) by mouth every 8 (eight) hours as needed.  Immunity status testing Discussed vaccination if nonimmune.  -     Measles/Mumps/Rubella Immunity -     Varicella Zoster Abs, IgG/IgM  Return for chronic follow up 10/26/23 if still in [place].   The patient indicates understanding of these issues and agrees with the plan.  Gabriel Earing, FNP

## 2023-08-10 LAB — MEASLES/MUMPS/RUBELLA IMMUNITY
MUMPS ABS, IGG: >300 [AU]/ml (ref >10.9)
RUBEOLA AB, IGG: >300 [AU]/ml (ref >16.4)
Rubella Antibodies, IGG: 7.69 {index} (ref >0.99)

## 2023-08-10 LAB — VARICELLA ZOSTER ABS, IGG/IGM
Varicella IgM: <0.91 {index} (ref 0.00–0.90)
Varicella zoster IgG: REACTIVE

## 2023-08-11 ENCOUNTER — Encounter: Payer: Self-pay | Admitting: Family Medicine

## 2023-08-12 ENCOUNTER — Ambulatory Visit: Payer: Medicare PPO | Admitting: Family Medicine

## 2023-09-18 ENCOUNTER — Other Ambulatory Visit: Payer: Self-pay | Admitting: Family Medicine

## 2023-09-18 DIAGNOSIS — K219 Gastro-esophageal reflux disease without esophagitis: Secondary | ICD-10-CM

## 2023-10-05 ENCOUNTER — Other Ambulatory Visit: Payer: Self-pay | Admitting: Family Medicine

## 2023-10-11 DIAGNOSIS — Z6839 Body mass index (BMI) 39.0-39.9, adult: Secondary | ICD-10-CM | POA: Diagnosis not present

## 2023-10-11 DIAGNOSIS — M4712 Other spondylosis with myelopathy, cervical region: Secondary | ICD-10-CM | POA: Diagnosis not present

## 2023-10-22 DIAGNOSIS — Z79899 Other long term (current) drug therapy: Secondary | ICD-10-CM | POA: Diagnosis not present

## 2023-10-22 DIAGNOSIS — Z1159 Encounter for screening for other viral diseases: Secondary | ICD-10-CM | POA: Diagnosis not present

## 2023-10-22 DIAGNOSIS — Z131 Encounter for screening for diabetes mellitus: Secondary | ICD-10-CM | POA: Diagnosis not present

## 2023-10-22 DIAGNOSIS — G8929 Other chronic pain: Secondary | ICD-10-CM | POA: Diagnosis not present

## 2023-10-22 DIAGNOSIS — T148XXA Other injury of unspecified body region, initial encounter: Secondary | ICD-10-CM | POA: Diagnosis not present

## 2023-10-22 DIAGNOSIS — E569 Vitamin deficiency, unspecified: Secondary | ICD-10-CM | POA: Diagnosis not present

## 2023-10-22 DIAGNOSIS — Z13220 Encounter for screening for lipoid disorders: Secondary | ICD-10-CM | POA: Diagnosis not present

## 2023-10-22 DIAGNOSIS — E041 Nontoxic single thyroid nodule: Secondary | ICD-10-CM | POA: Diagnosis not present

## 2023-10-22 DIAGNOSIS — K219 Gastro-esophageal reflux disease without esophagitis: Secondary | ICD-10-CM | POA: Diagnosis not present

## 2023-10-22 DIAGNOSIS — D649 Anemia, unspecified: Secondary | ICD-10-CM | POA: Diagnosis not present

## 2023-10-27 DIAGNOSIS — Z13220 Encounter for screening for lipoid disorders: Secondary | ICD-10-CM | POA: Diagnosis not present

## 2023-10-27 DIAGNOSIS — K219 Gastro-esophageal reflux disease without esophagitis: Secondary | ICD-10-CM | POA: Diagnosis not present

## 2023-10-27 DIAGNOSIS — E041 Nontoxic single thyroid nodule: Secondary | ICD-10-CM | POA: Diagnosis not present

## 2023-10-27 DIAGNOSIS — Z131 Encounter for screening for diabetes mellitus: Secondary | ICD-10-CM | POA: Diagnosis not present

## 2023-10-27 DIAGNOSIS — Z1159 Encounter for screening for other viral diseases: Secondary | ICD-10-CM | POA: Diagnosis not present

## 2023-10-27 DIAGNOSIS — E569 Vitamin deficiency, unspecified: Secondary | ICD-10-CM | POA: Diagnosis not present

## 2023-10-27 DIAGNOSIS — Z79899 Other long term (current) drug therapy: Secondary | ICD-10-CM | POA: Diagnosis not present

## 2023-10-27 DIAGNOSIS — G8929 Other chronic pain: Secondary | ICD-10-CM | POA: Diagnosis not present

## 2023-10-27 DIAGNOSIS — D649 Anemia, unspecified: Secondary | ICD-10-CM | POA: Diagnosis not present

## 2023-10-28 ENCOUNTER — Other Ambulatory Visit: Payer: Self-pay | Admitting: Family Medicine

## 2023-10-28 DIAGNOSIS — F5101 Primary insomnia: Secondary | ICD-10-CM

## 2023-11-15 DIAGNOSIS — J3089 Other allergic rhinitis: Secondary | ICD-10-CM | POA: Diagnosis not present

## 2023-11-15 DIAGNOSIS — G8929 Other chronic pain: Secondary | ICD-10-CM | POA: Diagnosis not present

## 2023-11-15 DIAGNOSIS — R748 Abnormal levels of other serum enzymes: Secondary | ICD-10-CM | POA: Diagnosis not present

## 2023-11-15 DIAGNOSIS — R944 Abnormal results of kidney function studies: Secondary | ICD-10-CM | POA: Diagnosis not present

## 2023-11-15 DIAGNOSIS — Z780 Asymptomatic menopausal state: Secondary | ICD-10-CM | POA: Diagnosis not present

## 2023-11-15 DIAGNOSIS — F411 Generalized anxiety disorder: Secondary | ICD-10-CM | POA: Diagnosis not present

## 2023-11-15 DIAGNOSIS — Z1231 Encounter for screening mammogram for malignant neoplasm of breast: Secondary | ICD-10-CM | POA: Diagnosis not present

## 2023-11-15 DIAGNOSIS — D649 Anemia, unspecified: Secondary | ICD-10-CM | POA: Diagnosis not present

## 2023-11-15 DIAGNOSIS — K219 Gastro-esophageal reflux disease without esophagitis: Secondary | ICD-10-CM | POA: Diagnosis not present

## 2023-11-25 ENCOUNTER — Ambulatory Visit

## 2023-11-25 DIAGNOSIS — Z13820 Encounter for screening for osteoporosis: Secondary | ICD-10-CM | POA: Diagnosis not present

## 2023-11-25 DIAGNOSIS — Z780 Asymptomatic menopausal state: Secondary | ICD-10-CM | POA: Diagnosis not present

## 2023-11-25 DIAGNOSIS — M810 Age-related osteoporosis without current pathological fracture: Secondary | ICD-10-CM | POA: Diagnosis not present

## 2023-12-31 DIAGNOSIS — E66813 Obesity, class 3: Secondary | ICD-10-CM | POA: Diagnosis not present

## 2023-12-31 DIAGNOSIS — Z6841 Body Mass Index (BMI) 40.0 and over, adult: Secondary | ICD-10-CM | POA: Diagnosis not present

## 2023-12-31 DIAGNOSIS — R109 Unspecified abdominal pain: Secondary | ICD-10-CM | POA: Diagnosis not present

## 2024-01-25 DIAGNOSIS — Z1231 Encounter for screening mammogram for malignant neoplasm of breast: Secondary | ICD-10-CM | POA: Diagnosis not present

## 2024-01-25 DIAGNOSIS — R92313 Mammographic fatty tissue density, bilateral breasts: Secondary | ICD-10-CM | POA: Diagnosis not present

## 2024-01-27 DIAGNOSIS — T148XXA Other injury of unspecified body region, initial encounter: Secondary | ICD-10-CM | POA: Diagnosis not present

## 2024-01-27 DIAGNOSIS — G43709 Chronic migraine without aura, not intractable, without status migrainosus: Secondary | ICD-10-CM | POA: Diagnosis not present

## 2024-01-27 DIAGNOSIS — Z20828 Contact with and (suspected) exposure to other viral communicable diseases: Secondary | ICD-10-CM | POA: Diagnosis not present

## 2024-01-27 DIAGNOSIS — E538 Deficiency of other specified B group vitamins: Secondary | ICD-10-CM | POA: Diagnosis not present

## 2024-01-27 DIAGNOSIS — F411 Generalized anxiety disorder: Secondary | ICD-10-CM | POA: Diagnosis not present

## 2024-01-27 DIAGNOSIS — J3089 Other allergic rhinitis: Secondary | ICD-10-CM | POA: Diagnosis not present

## 2024-01-27 DIAGNOSIS — K219 Gastro-esophageal reflux disease without esophagitis: Secondary | ICD-10-CM | POA: Diagnosis not present

## 2024-01-27 DIAGNOSIS — G8929 Other chronic pain: Secondary | ICD-10-CM | POA: Diagnosis not present

## 2024-01-27 DIAGNOSIS — F329 Major depressive disorder, single episode, unspecified: Secondary | ICD-10-CM | POA: Diagnosis not present

## 2024-04-13 DIAGNOSIS — M47812 Spondylosis without myelopathy or radiculopathy, cervical region: Secondary | ICD-10-CM | POA: Diagnosis not present

## 2024-04-13 DIAGNOSIS — M47816 Spondylosis without myelopathy or radiculopathy, lumbar region: Secondary | ICD-10-CM | POA: Diagnosis not present

## 2024-04-13 DIAGNOSIS — Z79891 Long term (current) use of opiate analgesic: Secondary | ICD-10-CM | POA: Diagnosis not present

## 2024-04-13 DIAGNOSIS — M47817 Spondylosis without myelopathy or radiculopathy, lumbosacral region: Secondary | ICD-10-CM | POA: Diagnosis not present

## 2024-04-13 DIAGNOSIS — M545 Low back pain, unspecified: Secondary | ICD-10-CM | POA: Diagnosis not present

## 2024-05-01 DIAGNOSIS — T148XXA Other injury of unspecified body region, initial encounter: Secondary | ICD-10-CM | POA: Diagnosis not present

## 2024-05-01 DIAGNOSIS — F411 Generalized anxiety disorder: Secondary | ICD-10-CM | POA: Diagnosis not present

## 2024-05-01 DIAGNOSIS — E569 Vitamin deficiency, unspecified: Secondary | ICD-10-CM | POA: Diagnosis not present

## 2024-05-01 DIAGNOSIS — D649 Anemia, unspecified: Secondary | ICD-10-CM | POA: Diagnosis not present

## 2024-05-01 DIAGNOSIS — G43909 Migraine, unspecified, not intractable, without status migrainosus: Secondary | ICD-10-CM | POA: Diagnosis not present

## 2024-05-01 DIAGNOSIS — K219 Gastro-esophageal reflux disease without esophagitis: Secondary | ICD-10-CM | POA: Diagnosis not present

## 2024-05-01 DIAGNOSIS — G8929 Other chronic pain: Secondary | ICD-10-CM | POA: Diagnosis not present

## 2024-05-01 DIAGNOSIS — R944 Abnormal results of kidney function studies: Secondary | ICD-10-CM | POA: Diagnosis not present

## 2024-05-01 DIAGNOSIS — J3089 Other allergic rhinitis: Secondary | ICD-10-CM | POA: Diagnosis not present

## 2024-05-01 DIAGNOSIS — F329 Major depressive disorder, single episode, unspecified: Secondary | ICD-10-CM | POA: Diagnosis not present

## 2024-05-01 DIAGNOSIS — E538 Deficiency of other specified B group vitamins: Secondary | ICD-10-CM | POA: Diagnosis not present

## 2024-05-12 ENCOUNTER — Other Ambulatory Visit: Payer: Self-pay | Admitting: Surgery
